# Patient Record
Sex: Female | Born: 1945 | Race: White | Hispanic: No | Marital: Married | Smoking: Never smoker
Health system: Southern US, Community
[De-identification: ages and names within clinical notes are randomized; demographics above are authoritative.]

## PROBLEM LIST (undated history)

## (undated) DIAGNOSIS — R011 Cardiac murmur, unspecified: Secondary | ICD-10-CM

## (undated) DIAGNOSIS — E7800 Pure hypercholesterolemia, unspecified: Secondary | ICD-10-CM

## (undated) DIAGNOSIS — T7840XA Allergy, unspecified, initial encounter: Secondary | ICD-10-CM

## (undated) DIAGNOSIS — N390 Urinary tract infection, site not specified: Secondary | ICD-10-CM

## (undated) DIAGNOSIS — K5140 Inflammatory polyps of colon without complications: Secondary | ICD-10-CM

## (undated) DIAGNOSIS — B019 Varicella without complication: Secondary | ICD-10-CM

## (undated) DIAGNOSIS — K5792 Diverticulitis of intestine, part unspecified, without perforation or abscess without bleeding: Secondary | ICD-10-CM

## (undated) DIAGNOSIS — I10 Essential (primary) hypertension: Secondary | ICD-10-CM

## (undated) DIAGNOSIS — M1990 Unspecified osteoarthritis, unspecified site: Secondary | ICD-10-CM

## (undated) DIAGNOSIS — G43909 Migraine, unspecified, not intractable, without status migrainosus: Secondary | ICD-10-CM

## (undated) HISTORY — DX: Cardiac murmur, unspecified: R01.1

## (undated) HISTORY — PX: OTHER SURGICAL HISTORY: SHX169

## (undated) HISTORY — DX: Urinary tract infection, site not specified: N39.0

## (undated) HISTORY — DX: Allergy, unspecified, initial encounter: T78.40XA

## (undated) HISTORY — DX: Diverticulitis of intestine, part unspecified, without perforation or abscess without bleeding: K57.92

## (undated) HISTORY — DX: Migraine, unspecified, not intractable, without status migrainosus: G43.909

## (undated) HISTORY — DX: Unspecified osteoarthritis, unspecified site: M19.90

## (undated) HISTORY — DX: Inflammatory polyps of colon without complications: K51.40

## (undated) HISTORY — DX: Varicella without complication: B01.9

## (undated) HISTORY — PX: PARTIAL COLECTOMY: SHX5273

## (undated) HISTORY — PX: COLON SURGERY: SHX602

---

## 1996-12-15 HISTORY — PX: APPENDECTOMY: SHX54

## 2011-03-14 ENCOUNTER — Ambulatory Visit: Payer: Self-pay | Admitting: Unknown Physician Specialty

## 2012-01-14 ENCOUNTER — Emergency Department: Payer: Self-pay | Admitting: Emergency Medicine

## 2012-01-14 LAB — URINALYSIS, COMPLETE
Bacteria: NONE SEEN
Bilirubin,UR: NEGATIVE
Blood: NEGATIVE
Glucose,UR: NEGATIVE mg/dL (ref 0–75)
Ketone: NEGATIVE
Leukocyte Esterase: NEGATIVE
Ph: 6 (ref 4.5–8.0)
Protein: NEGATIVE
RBC,UR: <1 /HPF (ref 0–5)
Squamous Epithelial: <1

## 2012-01-14 LAB — COMPREHENSIVE METABOLIC PANEL
Albumin: 4.1 g/dL (ref 3.4–5.0)
Alkaline Phosphatase: 38 U/L — ABNORMAL LOW (ref 50–136)
BUN: 17 mg/dL (ref 7–18)
Calcium, Total: 9.3 mg/dL (ref 8.5–10.1)
Co2: 27 mmol/L (ref 21–32)
EGFR (Non-African Amer.): >60
Glucose: 102 mg/dL — ABNORMAL HIGH (ref 65–99)
Osmolality: 285 (ref 275–301)
Potassium: 4.3 mmol/L (ref 3.5–5.1)
SGOT(AST): 27 U/L (ref 15–37)
SGPT (ALT): 29 U/L

## 2012-01-14 LAB — CBC
HCT: 42.3 % (ref 35.0–47.0)
MCH: 34 pg (ref 26.0–34.0)
MCHC: 34.3 g/dL (ref 32.0–36.0)
MCV: 99 fL (ref 80–100)
Platelet: 187 10*3/uL (ref 150–440)
RDW: 12.2 % (ref 11.5–14.5)
WBC: 10 10*3/uL (ref 3.6–11.0)

## 2012-09-20 ENCOUNTER — Encounter: Payer: Self-pay | Admitting: Internal Medicine

## 2012-09-20 ENCOUNTER — Ambulatory Visit (INDEPENDENT_AMBULATORY_CARE_PROVIDER_SITE_OTHER): Payer: BC Managed Care – PPO | Admitting: Internal Medicine

## 2012-09-20 VITALS — BP 120/70 | HR 81 | Temp 98.1°F | Ht 64.0 in | Wt 138.5 lb

## 2012-09-20 DIAGNOSIS — M7070 Other bursitis of hip, unspecified hip: Secondary | ICD-10-CM | POA: Insufficient documentation

## 2012-09-20 DIAGNOSIS — M76899 Other specified enthesopathies of unspecified lower limb, excluding foot: Secondary | ICD-10-CM

## 2012-09-20 NOTE — Assessment & Plan Note (Addendum)
And elbow, With prior normal films and symptoms occurring only with rest.  Continue NSAIDs, add tylenol.

## 2012-09-20 NOTE — Progress Notes (Signed)
Patient ID: Shannon Hickman, female   DOB: 1946-03-17, 66 y.o.   MRN: [id number]  Patient Active Problem List  Diagnosis  . Bursitis, hip    Subjective:  CC:   Chief Complaint  Patient presents with  . Establish Care    HPI:   Shannon Hickman a 66 y.o. female who presents with right sided hip pain.  Her pain occurs only at night,  And has been present for years but more frequently now. She has no prior history of trauma.  She has a new mattress . She sleeps on her back bc of hip pain . 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  She has treated her pain with alleve during the day, though.  Dr Toniann Fail rayed the hip a few years ago and it was normal.  2) Right elbow bursitis  , mild secondary to yard work     3)  Has been taking Climara patch for menopause and migraines which resolved.     Past Medical History  Diagnosis Date  . Chicken pox   . Diverticulitis   . Heart murmur   . Migraine   . Urinary tract infection   . Inflammatory polyps of colon     Past Surgical History  Procedure Date  . Histoplasmosis   . Appendectomy 1998  . Partial colectomy     diverticular rupture, Renda Rolls     The following portions of the patient's history were reviewed and updated as appropriate: Allergies, current medications, and problem list.    Review of Systems:   12 Pt  review of systems was negative except those addressed in the HPI,     History   Social History  . Marital Status: Married    Spouse Name: N/A    Number of Children: N/A  . Years of Education: N/A   Occupational History  . Not on file.   Social History Main Topics  . Smoking status: Never Smoker   . Smokeless tobacco: Not on file  . Alcohol Use: No  . Drug Use: Not on file  . Sexually Active: Not on file   Other Topics Concern  . Not on file   Social History Narrative  . No narrative on file    Objective:  BP 120/70  Pulse 81  Temp 98.1 F  (36.7 C) (Oral)  Ht 5\' 4"  (1.626 m)  Wt 138 lb 8 oz (62.823 kg)  BMI 23.77 kg/m2  SpO2 97%  General appearance: alert, cooperative and appears stated age Ears: normal TM's and external ear canals both ears Throat: lips, mucosa, and tongue normal; teeth and gums normal Neck: no adenopathy, no carotid bruit, supple, symmetrical, trachea midline and thyroid not enlarged, symmetric, no tenderness/mass/nodules Back: symmetric, no curvature. ROM normal. No CVA tenderness. Lungs: clear to auscultation bilaterally Heart: regular rate and rhythm, S1, S2 normal, no murmur, click, rub or gallop Abdomen: soft, non-tender; bowel sounds normal; no masses,  no organomegaly Pulses: 2+ and symmetric Skin: Skin color, texture, turgor normal. No rashes or lesions Lymph nodes: Cervical, supraclavicular, and axillary nodes normal.  Assessment and Plan:  Bursitis, hip With prior normal films and symptoms occurring only with rest.  Continue NSAIDs, add tylenol.    Updated Medication List Outpatient Encounter Prescriptions as of 09/20/2012  Medication Sig Dispense Refill  . aspirin-acetaminophen-caffeine (EXCEDRIN MIGRAINE) 250-250-65 MG per tablet Take 1 tablet by mouth every 6 (six) hours  as needed.      . calcium citrate (CALCITRATE - DOSED IN MG ELEMENTAL CALCIUM) 950 MG tablet Take 2 tablets by mouth daily.      . cetirizine (ZYRTEC) 10 MG tablet Take 10 mg by mouth as needed.      . cholecalciferol (VITAMIN D) 400 UNITS TABS Take by mouth.      [facility]) 0.045-0.015 MG/DAY Place 1 patch onto the skin once a week.      . fish oil-omega-3 fatty acids 1000 MG capsule Take 2 g by mouth daily.      . multivitamin-lutein (OCUVITE-LUTEIN) CAPS Take 1 capsule by mouth daily.      . naproxen sodium (ANAPROX) 220 MG tablet Take 220 mg by mouth as needed.      . vitamin E 400 UNIT capsule Take 400 Units by mouth daily.         Orders Placed This Encounter  Procedures  . HM  MAMMOGRAPHY  . HM PAP SMEAR  . HM COLONOSCOPY    No Follow-up on file.

## 2012-09-20 NOTE — Patient Instructions (Addendum)
Your hip and elbow are suffering from bursitis.    is ok to combine tylenol with alleve or motrin,  But do not combine motrin with alleve.  Never use more than 2000 mg tylenol in a 24 hour period.    Try to take a baby aspirin twice a week (primary prevention of stokes and heart attacks)  Practice your Kegel exercises (stopping your urine when you void) to help the stress incontinence .  You can try a joint supplement called glucosamine /chondroitin sulfate twice daily to see if it helps your joints.   Return in April for your  annual physical with PAP smear

## 2012-09-27 ENCOUNTER — Other Ambulatory Visit: Payer: Self-pay | Admitting: Internal Medicine

## 2013-03-16 ENCOUNTER — Other Ambulatory Visit (HOSPITAL_COMMUNITY)
Admission: RE | Admit: 2013-03-16 | Discharge: 2013-03-16 | Disposition: A | Discharge status: Home / Self Care | Payer: BC Managed Care – PPO | Source: Ambulatory Visit | Attending: Internal Medicine | Admitting: Internal Medicine

## 2013-03-16 ENCOUNTER — Encounter: Payer: Self-pay | Admitting: Internal Medicine

## 2013-03-16 ENCOUNTER — Ambulatory Visit (INDEPENDENT_AMBULATORY_CARE_PROVIDER_SITE_OTHER): Payer: BC Managed Care – PPO | Admitting: Internal Medicine

## 2013-03-16 VITALS — BP 128/66 | HR 80 | Temp 97.9°F | Resp 18 | Ht 63.0 in | Wt 145.0 lb

## 2013-03-16 DIAGNOSIS — Z0000 Encounter for general adult medical examination without abnormal findings: Secondary | ICD-10-CM

## 2013-03-16 DIAGNOSIS — Z01419 Encounter for gynecological examination (general) (routine) without abnormal findings: Secondary | ICD-10-CM | POA: Insufficient documentation

## 2013-03-16 DIAGNOSIS — Z1239 Encounter for other screening for malignant neoplasm of breast: Secondary | ICD-10-CM

## 2013-03-16 DIAGNOSIS — Z1151 Encounter for screening for human papillomavirus (HPV): Secondary | ICD-10-CM | POA: Insufficient documentation

## 2013-03-16 DIAGNOSIS — N950 Postmenopausal bleeding: Secondary | ICD-10-CM

## 2013-03-16 DIAGNOSIS — Z23 Encounter for immunization: Secondary | ICD-10-CM

## 2013-03-16 NOTE — Progress Notes (Signed)
Patient ID: Shannon Hickman, female   DOB: 07/23/1946, 67 y.o.   MRN: [id number]      Subjective:     Shannon Hickman is a 67 y.o. female and is here for a comprehensive physical exam. The patient reports frecent development of post menopausal bleeding .  She has been using ClimaraPro  for years , started taking it prior to menopause for management of hot flushes and reports that she has had periodic spotting in the last year.     ..  History   Social History  . Marital Status: Married    Spouse Name: N/A    Number of Children: N/A  . Years of Education: N/A   Occupational History  . Not on file.   Social History Main Topics  . Smoking status: Never Smoker   . Smokeless tobacco: Not on file  . Alcohol Use: No  . Drug Use: Not on file  . Sexually Active: Not on file   Other Topics Concern  . Not on file   Social History Narrative  . No narrative on file   Health Maintenance  Topic Date Due  . Influenza Vaccine  08/15/2013  . Mammogram  07/29/2014  . Tetanus/tdap  01/16/2021  . Colonoscopy  06/20/2022  . Pneumococcal Polysaccharide Vaccine Age 33 And Over  Completed  . Zostavax  Completed    The following portions of the patient's history were reviewed and updated as appropriate: allergies, current medications, past family history, past medical history, past social history, past surgical history and problem list.  Review of Systems A comprehensive review of systems was negative.   Objective:    BP 128/66  Pulse 80  Temp(Src) 97.9 F (36.6 C) (Oral)  Resp 18  Ht 5\' 3"  (1.6 m)  Wt 145 lb (65.772 kg)  BMI 25.69 kg/m2  SpO2 97%  General Appearance:    Alert, cooperative, no distress, appears stated age  Head:    Normocephalic, without obvious abnormality, atraumatic  Eyes:    PERRL, conjunctiva/corneas clear, EOM's intact, fundi    benign, both eyes  Ears:    Normal TM's and external ear canals, both ears  Nose:   Nares normal, septum midline, mucosa normal, no  drainage    or sinus tenderness  Throat:   Lips, mucosa, and tongue normal; teeth and gums normal  Neck:   Supple, symmetrical, trachea midline, no adenopathy;    thyroid:  no enlargement/tenderness/nodules; no carotid   bruit or JVD  Back:     Symmetric, no curvature, ROM normal, no CVA tenderness  Lungs:     Clear to auscultation bilaterally, respirations unlabored  Chest Wall:    No tenderness or deformity   Heart:    Regular rate and rhythm, S1 and S2 normal, no murmur, rub   or gallop  Breast Exam:    No tenderness, masses, or nipple abnormality  Abdomen:     Soft, non-tender, bowel sounds active all four quadrants,    no masses, no organomegaly  Genitalia:    Pelvic: cervix normal in appearance, external genitalia normal, no adnexal masses or tenderness, no cervical motion tenderness, rectovaginal septum normal, uterus normal size, shape, and consistency. Fresh blood in vaginal vault  Extremities:   Extremities normal, atraumatic, no cyanosis or edema  Pulses:   2+ and symmetric all extremities  Skin:   Skin color, texture, turgor normal, no rashes or lesions  Lymph nodes:   Cervical, supraclavicular, and axillary nodes normal  Neurologic:  CNII-XII intact, normal strength, sensation and reflexes    throughout    Assessment and Plan  Post-menopausal bleeding She has developed bleeding after many years of amenorrhea, while estrogen-progesterone therapy.  PAP smear was done today.  ClimaraPro has been suspended and she will be referred to Dr. Greggory Keen for evaluation of endometrium.   Routine general medical examination at a health care facility Annual exam including breast , pelvic and PAP smear were done today.    Updated Medication List Outpatient Encounter Prescriptions as of 03/16/2013  Medication Sig Dispense Refill  . aspirin-acetaminophen-caffeine (EXCEDRIN MIGRAINE) 250-250-65 MG per tablet Take 1 tablet by mouth every 6 (six) hours as needed.      . calcium citrate  (CALCITRATE - DOSED IN MG ELEMENTAL CALCIUM) 950 MG tablet Take 2 tablets by mouth daily.      . cetirizine (ZYRTEC) 10 MG tablet Take 10 mg by mouth as needed.      . cholecalciferol (VITAMIN D) 400 UNITS TABS Take by mouth.      . fish oil-omega-3 fatty acids 1000 MG capsule Take 2 g by mouth daily.      . multivitamin-lutein (OCUVITE-LUTEIN) CAPS Take 1 capsule by mouth daily.      . naproxen sodium (ANAPROX) 220 MG tablet Take 220 mg by mouth as needed.      . vitamin E 400 UNIT capsule Take 400 Units by mouth daily.      . [DISCONTINUED] estradiol-levonorgestrel (CLIMARAPRO) 0.045-0.015 MG/DAY Place 1 patch onto the skin once a week.       No facility-administered encounter medications on file as of 03/16/2013.

## 2013-03-16 NOTE — Patient Instructions (Addendum)
I recommend you have a Pneumonia vaccine    Mammogram ordered   Please stop the Climara.  If the vaginal bleeding does not stop you will need an endometrial biopsy to rule out endometrial CA as the cause

## 2013-03-17 ENCOUNTER — Encounter: Payer: Self-pay | Admitting: Internal Medicine

## 2013-03-17 ENCOUNTER — Telehealth: Payer: Self-pay | Admitting: Internal Medicine

## 2013-03-17 DIAGNOSIS — N950 Postmenopausal bleeding: Secondary | ICD-10-CM | POA: Insufficient documentation

## 2013-03-17 NOTE — Telephone Encounter (Signed)
I have been reviewing Shannon Hickman's history and  I'm not comfortable with our plan on waiting to see if her vaginal bleeding stops when she discontinues the climaraPro patch. I think she needs ot see a gynecologist to have an endometrial evaluation.  They may just do an ultrasound, not a biopsy . depending on the ultrasound.  Does she have a preference who I refer her to ?

## 2013-03-17 NOTE — Assessment & Plan Note (Addendum)
She has developed bleeding after many years of amenorrhea, while estrogen-progesterone therapy.  PAP smear was done today.  ClimaraPro has been suspended and she will be referred to Dr. Greggory Keen for evaluation of endometrium.

## 2013-03-17 NOTE — Assessment & Plan Note (Signed)
Annual exam including breast , pelvic and PAP smear were  done today.  

## 2013-03-18 NOTE — Telephone Encounter (Signed)
Referral is in process as requested 

## 2013-03-18 NOTE — Telephone Encounter (Signed)
Pt has no preference on a gynecologist.

## 2013-03-22 ENCOUNTER — Encounter: Payer: Self-pay | Admitting: General Practice

## 2013-03-23 ENCOUNTER — Other Ambulatory Visit (INDEPENDENT_AMBULATORY_CARE_PROVIDER_SITE_OTHER): Payer: BC Managed Care – PPO

## 2013-03-23 DIAGNOSIS — Z0000 Encounter for general adult medical examination without abnormal findings: Secondary | ICD-10-CM

## 2013-03-23 LAB — COMPREHENSIVE METABOLIC PANEL
ALT: 23 U/L (ref 0–35)
CO2: 28 mEq/L (ref 19–32)
Calcium: 8.9 mg/dL (ref 8.4–10.5)
Chloride: 105 mEq/L (ref 96–112)
GFR: 79.82 mL/min (ref >60.00)
Glucose, Bld: 92 mg/dL (ref 70–99)
Sodium: 139 mEq/L (ref 135–145)
Total Bilirubin: 1 mg/dL (ref 0.3–1.2)
Total Protein: 7.2 g/dL (ref 6.0–8.3)

## 2013-03-23 LAB — LIPID PANEL: HDL: 60.6 mg/dL (ref >39.00)

## 2013-03-23 LAB — CBC WITH DIFFERENTIAL/PLATELET
Basophils Absolute: 0 10*3/uL (ref 0.0–0.1)
Eosinophils Relative: 1.4 % (ref 0.0–5.0)
HCT: 43.8 % (ref 36.0–46.0)
Hemoglobin: 14.7 g/dL (ref 12.0–15.0)
Lymphocytes Relative: 20.4 % (ref 12.0–46.0)
Lymphs Abs: 0.9 10*3/uL (ref 0.7–4.0)
Monocytes Relative: 12.9 % — ABNORMAL HIGH (ref 3.0–12.0)
Neutro Abs: 3 10*3/uL (ref 1.4–7.7)
Platelets: 170 10*3/uL (ref 150.0–400.0)
WBC: 4.6 10*3/uL (ref 4.5–10.5)

## 2013-03-23 LAB — TSH: TSH: 0.9 u[IU]/mL (ref 0.35–5.50)

## 2013-03-24 ENCOUNTER — Encounter: Payer: Self-pay | Admitting: General Practice

## 2013-06-27 DIAGNOSIS — N760 Acute vaginitis: Secondary | ICD-10-CM | POA: Diagnosis not present

## 2013-06-27 DIAGNOSIS — L940 Localized scleroderma [morphea]: Secondary | ICD-10-CM | POA: Diagnosis not present

## 2013-07-06 ENCOUNTER — Ambulatory Visit (INDEPENDENT_AMBULATORY_CARE_PROVIDER_SITE_OTHER): Payer: Medicare Other | Admitting: Internal Medicine

## 2013-07-06 ENCOUNTER — Encounter: Payer: Self-pay | Admitting: Internal Medicine

## 2013-07-06 VITALS — BP 148/88 | HR 95 | Temp 98.4°F | Resp 16 | Wt 141.0 lb

## 2013-07-06 DIAGNOSIS — N950 Postmenopausal bleeding: Secondary | ICD-10-CM | POA: Diagnosis not present

## 2013-07-06 DIAGNOSIS — I10 Essential (primary) hypertension: Secondary | ICD-10-CM

## 2013-07-06 DIAGNOSIS — R030 Elevated blood-pressure reading, without diagnosis of hypertension: Secondary | ICD-10-CM

## 2013-07-06 LAB — MICROALBUMIN / CREATININE URINE RATIO: Microalb Creat Ratio: 0.8 mg/g (ref 0.0–30.0)

## 2013-07-06 NOTE — Patient Instructions (Addendum)
Your blood pressure is elevated.  (Normal is 130/80 or less)   I recommend giving up caffeine for a week to see if it resolves your hypertension  If your home readings are <  140/80,  Bring your machine by to comfirm its accuracy and we will not need to start medication unless there is protein in your urine  If readings are > 140/80 after giving up caffeine,  We will start medication

## 2013-07-06 NOTE — Progress Notes (Signed)
Patient ID: Emiko Osorto, female   DOB: 04-10-1946, 67 y.o.   MRN: [id number]  Patient Active Problem List   Diagnosis Date Noted  . Blood pressure elevated without history of HTN 07/07/2013  . Post-menopausal bleeding 03/17/2013  . Routine general medical examination at a health care facility 03/16/2013  . Bursitis, hip 09/20/2012    Subjective:  CC:   Chief Complaint  Patient presents with  . Follow-up    BP issues    HPI:   Graceyn Fodor a 67 y.o. female who presents for followup on multiple issues including new onset hypertension and postmenopausal bleeding. She stopped the climara patch in April.  She has been seeing Dr. Greggory Keen for endometrial biopsies and was noted on each occasion to have elevated blood pressure. She has considerable anxiety when she goes to his office due to the painful endometrial biopsies if he is performing. She has no history of hypertension prior to this. She exercises regularly 4-5 times a week. She is not overweight. She has a history of snoring when she is really tired. Her husband has not noted any apneic breathing episodes. She does note some fatigue when she is sitting doing passive activities. She has never fallen asleep while driving the car or at a stop light. She does have considerable caffeine intake including [employer] coffee daily and Excedrin headache 3-4 times a week.    Past Medical History  Diagnosis Date  . Chicken pox   . Diverticulitis   . Heart murmur   . Migraine   . Urinary tract infection   . Inflammatory polyps of colon     Past Surgical History  Procedure Laterality Date  . Histoplasmosis    . Appendectomy  1998  . Partial colectomy      diverticular rupture, Renda Rolls        The following portions of the patient's history were reviewed and updated as appropriate: Allergies, current medications, and problem list.    Review of Systems:   Patient denies headache, fevers, malaise, unintentional weight  loss, skin rash, eye pain, sinus congestion and sinus pain, sore throat, dysphagia,  hemoptysis , cough, dyspnea, wheezing, chest pain, palpitations, orthopnea, edema, abdominal pain, nausea, melena, diarrhea, constipation, flank pain, dysuria, hematuria, urinary  Frequency, nocturia, numbness, tingling, seizures,  Focal weakness, Loss of consciousness,  Tremor, insomnia, depression, anxiety, and suicidal ideation.     History   Social History  . Marital Status: Married    Spouse Name: N/A    Number of Children: N/A  . Years of Education: N/A   Occupational History  . Not on file.   Social History Main Topics  . Smoking status: Never Smoker   . Smokeless tobacco: Not on file  . Alcohol Use: No  . Drug Use: Not on file  . Sexually Active: Not on file   Other Topics Concern  . Not on file   Social History Narrative  . No narrative on file    Objective:  BP 148/88  Pulse 95  Temp(Src) 98.4 F (36.9 C) (Oral)  Resp 16  Wt 141 lb (63.957 kg)  BMI 24.98 kg/m2  SpO2 98%  General appearance: alert, cooperative and appears stated age Ears: normal TM's and external ear canals both ears Throat: lips, mucosa, and tongue normal; teeth and gums normal Neck: no adenopathy, no carotid bruit, supple, symmetrical, trachea midline and thyroid not enlarged, symmetric, no tenderness/mass/nodules Back: symmetric, no curvature. ROM normal. No CVA tenderness. Lungs: clear to auscultation bilaterally  Heart: regular rate and rhythm, S1, S2 normal, no murmur, click, rub or gallop Abdomen: soft, non-tender; bowel sounds normal; no masses,  no organomegaly Pulses: 2+ and symmetric Skin: Skin color, texture, turgor normal. No rashes or lesions Lymph nodes: Cervical, supraclavicular, and axillary nodes normal.  Assessment and Plan:  Blood pressure elevated without history of HTN Elevated blood pressures have been noted only since she developed endometrial bleeding and need for recurrent  biopsies .  She is not taking a nonsteroidal anti-inflammatory on a daily basis. she has normal renal function and no evidence of micro-proteinuria.. We discussed reducing her caffeine intake and rechecking her blood pressures. I have not started her on any medication at this time.  Post-menopausal bleeding Roughly due to use of Climara patch. This is been discontinued. She is following up with Dr. Alvie Heidelberg for endometrial biopsies.   Updated Medication List Outpatient Encounter Prescriptions as of 07/06/2013  Medication Sig Dispense Refill  . aspirin-acetaminophen-caffeine (EXCEDRIN MIGRAINE) 250-250-65 MG per tablet Take 1 tablet by mouth every 6 (six) hours as needed.      . calcium citrate (CALCITRATE - DOSED IN MG ELEMENTAL CALCIUM) 950 MG tablet Take 2 tablets by mouth daily.      . cetirizine (ZYRTEC) 10 MG tablet Take 10 mg by mouth as needed.      . cholecalciferol (VITAMIN D) 400 UNITS TABS Take by mouth.      . fish oil-omega-3 fatty acids 1000 MG capsule Take 2 g by mouth daily.      . multivitamin-lutein (OCUVITE-LUTEIN) CAPS Take 1 capsule by mouth daily.      . naproxen sodium (ANAPROX) 220 MG tablet Take 220 mg by mouth as needed.      . vitamin E 400 UNIT capsule Take 400 Units by mouth daily.       No facility-administered encounter medications on file as of 07/06/2013.     Orders Placed This Encounter  Procedures  . Microalbumin / creatinine urine ratio    No Follow-up on file.

## 2013-07-07 ENCOUNTER — Encounter: Payer: Self-pay | Admitting: Internal Medicine

## 2013-07-07 DIAGNOSIS — I10 Essential (primary) hypertension: Secondary | ICD-10-CM | POA: Insufficient documentation

## 2013-07-07 NOTE — Assessment & Plan Note (Addendum)
Elevated blood pressures have been noted only since she developed endometrial bleeding and need for recurrent biopsies .  She is not taking a nonsteroidal anti-inflammatory on a daily basis. she has normal renal function and no evidence of micro-proteinuria.. We discussed reducing her caffeine intake and rechecking her blood pressures. I have not started her on any medication at this time.

## 2013-07-07 NOTE — Assessment & Plan Note (Signed)
Roughly due to use of Climara patch. This is been discontinued. She is following up with Dr. Alvie Heidelberg for endometrial biopsies.

## 2013-07-25 DIAGNOSIS — Z1231 Encounter for screening mammogram for malignant neoplasm of breast: Secondary | ICD-10-CM | POA: Diagnosis not present

## 2013-08-02 ENCOUNTER — Encounter: Payer: Self-pay | Admitting: Internal Medicine

## 2013-08-25 LAB — HM PAP SMEAR: HM PAP: NORMAL

## 2013-09-05 DIAGNOSIS — L940 Localized scleroderma [morphea]: Secondary | ICD-10-CM | POA: Diagnosis not present

## 2013-09-05 DIAGNOSIS — N951 Menopausal and female climacteric states: Secondary | ICD-10-CM | POA: Diagnosis not present

## 2014-07-27 DIAGNOSIS — Z1231 Encounter for screening mammogram for malignant neoplasm of breast: Secondary | ICD-10-CM | POA: Diagnosis not present

## 2014-07-27 DIAGNOSIS — R928 Other abnormal and inconclusive findings on diagnostic imaging of breast: Secondary | ICD-10-CM | POA: Diagnosis not present

## 2014-07-27 DIAGNOSIS — Z978 Presence of other specified devices: Secondary | ICD-10-CM | POA: Diagnosis not present

## 2014-08-02 ENCOUNTER — Encounter: Payer: Self-pay | Admitting: Internal Medicine

## 2014-08-25 ENCOUNTER — Ambulatory Visit (INDEPENDENT_AMBULATORY_CARE_PROVIDER_SITE_OTHER): Payer: Medicare Other | Admitting: Internal Medicine

## 2014-08-25 ENCOUNTER — Encounter: Payer: Self-pay | Admitting: Internal Medicine

## 2014-08-25 VITALS — BP 146/74 | HR 77 | Temp 98.0°F | Resp 14 | Ht 63.0 in | Wt 148.5 lb

## 2014-08-25 DIAGNOSIS — Z0000 Encounter for general adult medical examination without abnormal findings: Secondary | ICD-10-CM

## 2014-08-25 DIAGNOSIS — N950 Postmenopausal bleeding: Secondary | ICD-10-CM

## 2014-08-25 DIAGNOSIS — B351 Tinea unguium: Secondary | ICD-10-CM

## 2014-08-25 DIAGNOSIS — N763 Subacute and chronic vulvitis: Secondary | ICD-10-CM

## 2014-08-25 DIAGNOSIS — R5381 Other malaise: Secondary | ICD-10-CM | POA: Diagnosis not present

## 2014-08-25 DIAGNOSIS — R030 Elevated blood-pressure reading, without diagnosis of hypertension: Secondary | ICD-10-CM | POA: Diagnosis not present

## 2014-08-25 DIAGNOSIS — Z23 Encounter for immunization: Secondary | ICD-10-CM | POA: Diagnosis not present

## 2014-08-25 DIAGNOSIS — R5383 Other fatigue: Secondary | ICD-10-CM

## 2014-08-25 DIAGNOSIS — Z13820 Encounter for screening for osteoporosis: Secondary | ICD-10-CM

## 2014-08-25 DIAGNOSIS — Z13220 Encounter for screening for lipoid disorders: Secondary | ICD-10-CM

## 2014-08-25 NOTE — Progress Notes (Signed)
Patient ID: Shannon Hickman, female   DOB: [birth year], 68 y.o.   MRN: [id number]  The patient is here for annual Medicare wellness examination and management of other chronic and acute problems.  Seh has several subacute issues she raised today     1) Skin tags.  Occurring on the chest wall and bra line.   2) She notes recent development of numbness involving the first 3 fingers on the right hand when she dried her hair or uses her hand.  The numbness resolves, if she drops her wrist.  She has developed a trigger finger  On the right hand,  4th finger .  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   3) Toenail fungus .involving both feet.  re reflected in the social history.  The roster of all physicians providing medical care to patient - is listed in the Snapshot section of the chart.  Activities of daily living:  The patient is 100% independent in all ADLs: dressing, toileting, feeding as well as independent mobility  Home safety : The patient has smoke detectors in the home. They wear seatbelts.  There are no firearms at home. There is no violence in the home.   There is no risks for hepatitis, STDs or HIV. There is no   history of blood transfusion. They have no travel history to infectious disease endemic areas of the world.  The patient has seen their dentist in the last six month. They have seen their eye doctor in the last year. They admit to slight hearing difficulty with regard to whispered voices and some television programs.  They have deferred audiologic testing in the last year.  They do not  have excessive sun exposure. Discussed the need for sun protection: hats, long sleeves and use of sunscreen if there is significant sun exposure.   Diet: the importance of a healthy diet is discussed. They do have a healthy diet.  The benefits of regular aerobic exercise were discussed. She  walks 4 times per week ,  20 minutes.   Depression screen: there are no signs or vegative symptoms of depression- irritability, change in appetite, anhedonia, sadness/tearfullness.  Cognitive assessment: the patient manages all their financial and personal affairs and is actively engaged. They could relate day,date,year and events; recalled 2/3 objects at 3 minutes; performed clock-face test normally.  The following portions of the patient's history were reviewed and updated as appropriate: allergies, current medications, past family history, past medical history,  past surgical history, past social history  and problem list.  Visual acuity was not assessed per patient preference since she has regular follow up with her ophthalmologist. Hearing and body mass index were assessed and reviewed.   During the course of the visit the patient was educated and counseled about appropriate screening and preventive services including : fall prevention , diabetes screening, nutrition counseling, colorectal cancer screening, and recommended immunizations.    Objective:  BP 146/74  Pulse 77  Temp(Src) 98 F (36.7 C) (Oral)  Resp 14  Ht 5\' 3"  (1.6 m)  Wt 148 lb 8 oz (67.359 kg)  BMI 26.31 kg/m2  SpO2 98%  General appearance: alert, cooperative and appears stated age Head: Normocephalic, without obvious abnormality, atraumatic Eyes: conjunctivae/corneas clear. PERRL, EOM's intact. Fundi benign. Ears: normal TM's and external ear canals both ears Nose: Nares normal. Septum midline. Mucosa normal. No drainage or sinus tenderness. Throat:  lips, mucosa, and tongue normal; teeth and gums normal Neck: no adenopathy, no carotid bruit, no JVD, supple, symmetrical, trachea midline and thyroid not enlarged, symmetric, no tenderness/mass/nodules Lungs: clear to auscultation bilaterally Breasts: normal appearance, no masses or tenderness Heart: regular rate and rhythm, S1, S2 normal, no murmur, click, rub or  gallop Abdomen: soft, non-tender; bowel sounds normal; no masses,  no organomegaly Extremities: extremities normal, atraumatic, no cyanosis or edema Pulses: 2+ and symmetric Skin: Skin color, texture, turgor normal. No rashes or lesions Neurologic: Alert and oriented X 3, normal strength and tone. Normal symmetric reflexes. Normal coordination and gait.   Assessment and Plan:  Onychomycosis Bilateral , has tried OTC meds without success . Will return for baseline lfts and start oral terbinafine 250 mg daily x 3 months.   Encounter for Medicare annual wellness exam Annual Medicare wellness  exam was done as well as a comprehensive physical exam and management of acute and chronic conditions .  During the course of the visit the patient was educated and counseled about appropriate screening and preventive services including : fall prevention , diabetes screening, nutrition counseling, colorectal cancer screening, and recommended immunizations.  Printed recommendations for health maintenance screenings was given.   Chronic vulvitis Managed by Dr Enzo Bi,  Biopsy done July 2014 prior to initiation of steroid therapy  Blood pressure elevated without history of HTN Home BPs have not been checked,  And the last 3 office notes (mine nad Dr Enzo Bi) note Stage 1 hypertension.  Will start hctz for management. She will return for baseline assesment of renal function and liver function   Lab Results  Component Value Date   CREATININE 0.8 03/23/2013      Updated Medication List Outpatient Encounter Prescriptions as of 08/25/2014  Medication Sig  . acetaminophen (TYLENOL) 325 MG tablet Take 650 mg by mouth every 6 (six) hours as needed.  . calcium citrate (CALCITRATE - DOSED IN MG ELEMENTAL CALCIUM) 950 MG tablet Take 2 tablets by mouth daily.  . cetirizine (ZYRTEC) 10 MG tablet Take 10 mg by mouth as needed.  . COCONUT OIL PO Take 1,000 mg by mouth 2 (two) times daily.  . fish oil-omega-3  fatty acids 1000 MG capsule Take 2 g by mouth daily.  . Multiple Vitamins-Minerals (HAIR/SKIN/NAILS/BIOTIN PO) Take 1 tablet by mouth daily.  . multivitamin-lutein (OCUVITE-LUTEIN) CAPS Take 1 capsule by mouth daily.  . naproxen sodium (ANAPROX) 220 MG tablet Take 220 mg by mouth 2 (two) times daily with a meal.   . [DISCONTINUED] aspirin-acetaminophen-caffeine (EXCEDRIN MIGRAINE) 250-250-65 MG per tablet Take 1 tablet by mouth every 6 (six) hours as needed.  . [DISCONTINUED] cholecalciferol (VITAMIN D) 400 UNITS TABS Take by mouth.  . [DISCONTINUED] vitamin E 400 UNIT capsule Take 400 Units by mouth daily.

## 2014-08-25 NOTE — Assessment & Plan Note (Addendum)
Bilateral , has tried OTC meds without success . Will return for baseline lfts and start oral terbinafine 250 mg daily x 3 months.

## 2014-08-25 NOTE — Progress Notes (Signed)
Pre visit review using our clinic review tool, if applicable. No additional management support is needed unless otherwise documented below in the visit note. 

## 2014-08-25 NOTE — Patient Instructions (Addendum)
Next we should obtain a 3 d mammogram since your breasts are "dense."    Pumpkin Raisin Crunch and All Bran combination gives you 22 g fiber with the last amount of added sugar   Atkins bars and Quest bars are loaded with protein and very little sugar   I recommend getting the majority of your calcium and Vitamin D  through diet rather than supplements given the recent association of calcium supplements with increased coronary artery calcium scores (You need 1200 mg daily )   Unsweetened almond/coconut milk is a great low calorie low carb, cholesterol free  way to increase your dietary calcium and vitamin D.  Try the blue Jackquline Bosch  Return for fasting labs  Once  I review liver tests,  I'll send in a rx for toenail fungus medication   Bone Density test to be ordered  Health Maintenance Adopting a healthy lifestyle and getting preventive care can go a long way to promote health and wellness. Talk with your health care provider about what schedule of regular examinations is right for you. This is a good chance for you to check in with your provider about disease prevention and staying healthy. In between checkups, there are plenty of things you can do on your own. Experts have done a lot of research about which lifestyle changes and preventive measures are most likely to keep you healthy. Ask your health care provider for more information. WEIGHT AND DIET  Eat a healthy diet  Be sure to include plenty of vegetables, fruits, low-fat dairy products, and lean protein.  Do not eat a lot of foods high in solid fats, added sugars, or salt.  Get regular exercise. This is one of the most important things you can do for your health.  Most adults should exercise for at least 150 minutes each week. The exercise should increase your heart rate and make you sweat (moderate-intensity exercise).  Most adults should also do strengthening exercises at least twice a week. This is in addition to the  moderate-intensity exercise.  Maintain a healthy weight  Body mass index (BMI) is a measurement that can be used to identify possible weight problems. It estimates body fat based on height and weight. Your health care provider can help determine your BMI and help you achieve or maintain a healthy weight.  For females 81 years of age and older:   A BMI below 18.5 is considered underweight.  A BMI of 18.5 to 24.9 is normal.  A BMI of 25 to 29.9 is considered overweight.  A BMI of 30 and above is considered obese.  Watch levels of cholesterol and blood lipids  You should start having your blood tested for lipids and cholesterol at 67 years of age, then have this test every 5 years.  You may need to have your cholesterol levels checked more often if:  Your lipid or cholesterol levels are high.  You are older than 67 years of age.  You are at high risk for heart disease.  CANCER SCREENING   Lung Cancer  Lung cancer screening is recommended for adults 25-61 years old who are at high risk for lung cancer because of a history of smoking.  A yearly low-dose CT scan of the lungs is recommended for people who:  Currently smoke.  Have quit within the past 15 years.  Have at least a 30-pack-year history of smoking. A pack year is smoking an average of one pack of cigarettes a day for  1 year.  Yearly screening should continue until it has been 15 years since you quit.  Yearly screening should stop if you develop a health problem that would prevent you from having lung cancer treatment.  Breast Cancer  Practice breast self-awareness. This means understanding how your breasts normally appear and feel.  It also means doing regular breast self-exams. Let your health care provider know about any changes, no matter how small.  If you are in your 20s or 30s, you should have a clinical breast exam (CBE) by a health care provider every 1-3 years as part of a regular health exam.  If  you are 76 or older, have a CBE every year. Also consider having a breast X-ray (mammogram) every year.  If you have a family history of breast cancer, talk to your health care provider about genetic screening.  If you are at high risk for breast cancer, talk to your health care provider about having an MRI and a mammogram every year.  Breast cancer gene (BRCA) assessment is recommended for women who have family members with BRCA-related cancers. BRCA-related cancers include:  Breast.  Ovarian.  Tubal.  Peritoneal cancers.  Results of the assessment will determine the need for genetic counseling and BRCA1 and BRCA2 testing. Cervical Cancer Routine pelvic examinations to screen for cervical cancer are no longer recommended for nonpregnant women who are considered low risk for cancer of the pelvic organs (ovaries, uterus, and vagina) and who do not have symptoms. A pelvic examination may be necessary if you have symptoms including those associated with pelvic infections. Ask your health care provider if a screening pelvic exam is right for you.   The Pap test is the screening test for cervical cancer for women who are considered at risk.  If you had a hysterectomy for a problem that was not cancer or a condition that could lead to cancer, then you no longer need Pap tests.  If you are older than 65 years, and you have had normal Pap tests for the past 10 years, you no longer need to have Pap tests.  If you have had past treatment for cervical cancer or a condition that could lead to cancer, you need Pap tests and screening for cancer for at least 20 years after your treatment.  If you no longer get a Pap test, assess your risk factors if they change (such as having a new sexual partner). This can affect whether you should start being screened again.  Some women have medical problems that increase their chance of getting cervical cancer. If this is the case for you, your health care  provider may recommend more frequent screening and Pap tests.  The human papillomavirus (HPV) test is another test that may be used for cervical cancer screening. The HPV test looks for the virus that can cause cell changes in the cervix. The cells collected during the Pap test can be tested for HPV.  The HPV test can be used to screen women 56 years of age and older. Getting tested for HPV can extend the interval between normal Pap tests from three to five years.  An HPV test also should be used to screen women of any age who have unclear Pap test results.  After 67 years of age, women should have HPV testing as often as Pap tests.  Colorectal Cancer  This type of cancer can be detected and often prevented.  Routine colorectal cancer screening usually begins at 67 years of  age and continues through 67 years of age.  Your health care provider may recommend screening at an earlier age if you have risk factors for colon cancer.  Your health care provider may also recommend using home test kits to check for hidden blood in the stool.  A small camera at the end of a tube can be used to examine your colon directly (sigmoidoscopy or colonoscopy). This is done to check for the earliest forms of colorectal cancer.  Routine screening usually begins at age 18.  Direct examination of the colon should be repeated every 5-10 years through 67 years of age. However, you may need to be screened more often if early forms of precancerous polyps or small growths are found. Skin Cancer  Check your skin from head to toe regularly.  Tell your health care provider about any new moles or changes in moles, especially if there is a change in a mole's shape or color.  Also tell your health care provider if you have a mole that is larger than the size of a pencil eraser.  Always use sunscreen. Apply sunscreen liberally and repeatedly throughout the day.  Protect yourself by wearing long sleeves, pants, a  wide-brimmed hat, and sunglasses whenever you are outside. HEART DISEASE, DIABETES, AND HIGH BLOOD PRESSURE   Have your blood pressure checked at least every 1-2 years. High blood pressure causes heart disease and increases the risk of stroke.  If you are between 11 years and 54 years old, ask your health care provider if you should take aspirin to prevent strokes.  Have regular diabetes screenings. This involves taking a blood sample to check your fasting blood sugar level.  If you are at a normal weight and have a low risk for diabetes, have this test once every three years after 67 years of age.  If you are overweight and have a high risk for diabetes, consider being tested at a younger age or more often. PREVENTING INFECTION  Hepatitis B  If you have a higher risk for hepatitis B, you should be screened for this virus. You are considered at high risk for hepatitis B if:  You were born in a country where hepatitis B is common. Ask your health care provider which countries are considered high risk.  Your parents were born in a high-risk country, and you have not been immunized against hepatitis B (hepatitis B vaccine).  You have HIV or AIDS.  You use needles to inject street drugs.  You live with someone who has hepatitis B.  You have had sex with someone who has hepatitis B.  You get hemodialysis treatment.  You take certain medicines for conditions, including cancer, organ transplantation, and autoimmune conditions. Hepatitis C  Blood testing is recommended for:  Everyone born from [birth year] through 1964.  Anyone with known risk factors for hepatitis C. Sexually transmitted infections (STIs)  You should be screened for sexually transmitted infections (STIs) including gonorrhea and chlamydia if:  You are sexually active and are younger than 67 years of age.  You are older than 67 years of age and your health care provider tells you that you are at risk for this type of  infection.  Your sexual activity has changed since you were last screened and you are at an increased risk for chlamydia or gonorrhea. Ask your health care provider if you are at risk.  If you do not have HIV, but are at risk, it may be recommended that you take a  prescription medicine daily to prevent HIV infection. This is called pre-exposure prophylaxis (PrEP). You are considered at risk if:  You are sexually active and do not regularly use condoms or know the HIV status of your partner(s).  You take drugs by injection.  You are sexually active with a partner who has HIV. Talk with your health care provider about whether you are at high risk of being infected with HIV. If you choose to begin PrEP, you should first be tested for HIV. You should then be tested every 3 months for as long as you are taking PrEP.  PREGNANCY   If you are premenopausal and you may become pregnant, ask your health care provider about preconception counseling.  If you may become pregnant, take 400 to 800 micrograms (mcg) of folic acid every day.  If you want to prevent pregnancy, talk to your health care provider about birth control (contraception). OSTEOPOROSIS AND MENOPAUSE   Osteoporosis is a disease in which the bones lose minerals and strength with aging. This can result in serious bone fractures. Your risk for osteoporosis can be identified using a bone density scan.  If you are 46 years of age or older, or if you are at risk for osteoporosis and fractures, ask your health care provider if you should be screened.  Ask your health care provider whether you should take a calcium or vitamin D supplement to lower your risk for osteoporosis.  Menopause may have certain physical symptoms and risks.  Hormone replacement therapy may reduce some of these symptoms and risks. Talk to your health care provider about whether hormone replacement therapy is right for you.  HOME CARE INSTRUCTIONS   Schedule regular  health, dental, and eye exams.  Stay current with your immunizations.   Do not use any tobacco products including cigarettes, chewing tobacco, or electronic cigarettes.  If you are pregnant, do not drink alcohol.  If you are breastfeeding, limit how much and how often you drink alcohol.  Limit alcohol intake to no more than 1 drink per day for nonpregnant women. One drink equals 12 ounces of beer, 5 ounces of wine, or 1 ounces of hard liquor.  Do not use street drugs.  Do not share needles.  Ask your health care provider for help if you need support or information about quitting drugs.  Tell your health care provider if you often feel depressed.  Tell your health care provider if you have ever been abused or do not feel safe at home. Document Released: 06/16/2011 Document Revised: 04/17/2014 Document Reviewed: 11/02/2013 [facility] Patient Information 2015 [place], [place]. This information is not intended to replace advice given to you by your health care provider. Make sure you discuss any questions you have with your health care provider.

## 2014-08-27 DIAGNOSIS — N763 Subacute and chronic vulvitis: Secondary | ICD-10-CM | POA: Insufficient documentation

## 2014-08-27 NOTE — Assessment & Plan Note (Signed)

## 2014-08-27 NOTE — Assessment & Plan Note (Addendum)
Home BPs have not been checked,  And the last 3 office notes (mine nad Dr Enzo Bi) note Stage 1 hypertension.  Will start hctz for management. She will return for baseline assesment of renal function and liver function   Lab Results  Component Value Date   CREATININE 0.8 03/23/2013

## 2014-08-27 NOTE — Assessment & Plan Note (Signed)
Managed by Dr Enzo Bi,  Biopsy done July 2014 prior to initiation of steroid therapy

## 2014-08-28 ENCOUNTER — Other Ambulatory Visit (INDEPENDENT_AMBULATORY_CARE_PROVIDER_SITE_OTHER): Payer: Medicare Other

## 2014-08-28 DIAGNOSIS — R5383 Other fatigue: Secondary | ICD-10-CM | POA: Diagnosis not present

## 2014-08-28 DIAGNOSIS — Z13220 Encounter for screening for lipoid disorders: Secondary | ICD-10-CM

## 2014-08-28 DIAGNOSIS — R5381 Other malaise: Secondary | ICD-10-CM

## 2014-08-28 DIAGNOSIS — R030 Elevated blood-pressure reading, without diagnosis of hypertension: Secondary | ICD-10-CM

## 2014-08-28 LAB — CBC WITH DIFFERENTIAL/PLATELET
Basophils Absolute: 0 10*3/uL (ref 0.0–0.1)
Basophils Relative: 0.8 % (ref 0.0–3.0)
Eosinophils Absolute: 0.1 10*3/uL (ref 0.0–0.7)
Eosinophils Relative: 2.2 % (ref 0.0–5.0)
HCT: 42.3 % (ref 36.0–46.0)
Hemoglobin: 14.3 g/dL (ref 12.0–15.0)
LYMPHS ABS: 1 10*3/uL (ref 0.7–4.0)
LYMPHS PCT: 31.9 % (ref 12.0–46.0)
MCHC: 33.8 g/dL (ref 30.0–36.0)
MCV: 96.6 fl (ref 78.0–100.0)
Monocytes Absolute: 0.3 10*3/uL (ref 0.1–1.0)
Monocytes Relative: 10.7 % (ref 3.0–12.0)
NEUTROS PCT: 54.4 % (ref 43.0–77.0)
Neutro Abs: 1.8 10*3/uL (ref 1.4–7.7)
Platelets: 181 10*3/uL (ref 150.0–400.0)
RBC: 4.38 Mil/uL (ref 3.87–5.11)
RDW: 13.2 % (ref 11.5–15.5)
WBC: 3.3 10*3/uL — ABNORMAL LOW (ref 4.0–10.5)

## 2014-08-28 LAB — LIPID PANEL
Cholesterol: 234 mg/dL — ABNORMAL HIGH (ref 0–200)
HDL: 73.4 mg/dL (ref >39.00)
LDL Cholesterol: 153 mg/dL — ABNORMAL HIGH (ref 0–99)
NONHDL: 160.6
TRIGLYCERIDES: 40 mg/dL (ref 0.0–149.0)
Total CHOL/HDL Ratio: 3
VLDL: 8 mg/dL (ref 0.0–40.0)

## 2014-08-28 LAB — COMPREHENSIVE METABOLIC PANEL
ALK PHOS: 46 U/L (ref 39–117)
ALT: 25 U/L (ref 0–35)
AST: 25 U/L (ref 0–37)
Albumin: 3.8 g/dL (ref 3.5–5.2)
BILIRUBIN TOTAL: 0.5 mg/dL (ref 0.2–1.2)
BUN: 18 mg/dL (ref 6–23)
CO2: 29 meq/L (ref 19–32)
Calcium: 9.3 mg/dL (ref 8.4–10.5)
Chloride: 104 mEq/L (ref 96–112)
Creatinine, Ser: 0.7 mg/dL (ref 0.4–1.2)
GFR: 84.52 mL/min (ref >60.00)
Glucose, Bld: 87 mg/dL (ref 70–99)
Potassium: 4.4 mEq/L (ref 3.5–5.1)
Sodium: 140 mEq/L (ref 135–145)
Total Protein: 7.3 g/dL (ref 6.0–8.3)

## 2014-08-28 LAB — MICROALBUMIN / CREATININE URINE RATIO
Creatinine,U: 118 mg/dL
Microalb Creat Ratio: 0.3 mg/g (ref 0.0–30.0)
Microalb, Ur: 0.3 mg/dL (ref 0.0–1.9)

## 2014-08-28 LAB — TSH: TSH: 0.8 u[IU]/mL (ref 0.35–4.50)

## 2014-08-29 ENCOUNTER — Encounter: Payer: Self-pay | Admitting: *Deleted

## 2014-09-18 DIAGNOSIS — Z23 Encounter for immunization: Secondary | ICD-10-CM | POA: Diagnosis not present

## 2015-01-26 DIAGNOSIS — M65341 Trigger finger, right ring finger: Secondary | ICD-10-CM | POA: Diagnosis not present

## 2015-01-26 DIAGNOSIS — S83411A Sprain of medial collateral ligament of right knee, initial encounter: Secondary | ICD-10-CM | POA: Diagnosis not present

## 2015-01-26 DIAGNOSIS — G5601 Carpal tunnel syndrome, right upper limb: Secondary | ICD-10-CM | POA: Diagnosis not present

## 2015-01-26 DIAGNOSIS — M19041 Primary osteoarthritis, right hand: Secondary | ICD-10-CM | POA: Diagnosis not present

## 2015-01-26 DIAGNOSIS — M25561 Pain in right knee: Secondary | ICD-10-CM | POA: Diagnosis not present

## 2015-07-03 DIAGNOSIS — M25462 Effusion, left knee: Secondary | ICD-10-CM | POA: Diagnosis not present

## 2015-07-03 DIAGNOSIS — M25562 Pain in left knee: Secondary | ICD-10-CM | POA: Diagnosis not present

## 2015-07-05 ENCOUNTER — Other Ambulatory Visit: Payer: Self-pay | Admitting: Orthopedic Surgery

## 2015-07-05 DIAGNOSIS — M25462 Effusion, left knee: Secondary | ICD-10-CM

## 2015-07-05 DIAGNOSIS — M25562 Pain in left knee: Secondary | ICD-10-CM

## 2015-07-13 ENCOUNTER — Ambulatory Visit
Admission: RE | Admit: 2015-07-13 | Discharge: 2015-07-13 | Disposition: A | Discharge status: Home / Self Care | Payer: Medicare Other | Source: Ambulatory Visit | Attending: Orthopedic Surgery | Admitting: Orthopedic Surgery

## 2015-07-13 DIAGNOSIS — M1712 Unilateral primary osteoarthritis, left knee: Secondary | ICD-10-CM | POA: Insufficient documentation

## 2015-07-13 DIAGNOSIS — M25562 Pain in left knee: Secondary | ICD-10-CM

## 2015-07-13 DIAGNOSIS — M94262 Chondromalacia, left knee: Secondary | ICD-10-CM | POA: Diagnosis not present

## 2015-07-13 DIAGNOSIS — M7122 Synovial cyst of popliteal space [Baker], left knee: Secondary | ICD-10-CM | POA: Diagnosis not present

## 2015-07-13 DIAGNOSIS — M25462 Effusion, left knee: Secondary | ICD-10-CM | POA: Insufficient documentation

## 2015-07-25 DIAGNOSIS — S83232D Complex tear of medial meniscus, current injury, left knee, subsequent encounter: Secondary | ICD-10-CM | POA: Diagnosis not present

## 2015-07-30 DIAGNOSIS — Z9882 Breast implant status: Secondary | ICD-10-CM | POA: Diagnosis not present

## 2015-07-30 DIAGNOSIS — Z1231 Encounter for screening mammogram for malignant neoplasm of breast: Secondary | ICD-10-CM | POA: Diagnosis not present

## 2015-07-30 LAB — HM MAMMOGRAPHY

## 2015-07-31 ENCOUNTER — Encounter
Admission: RE | Admit: 2015-07-31 | Discharge: 2015-07-31 | Disposition: A | Discharge status: Home / Self Care | Payer: Medicare Other | Source: Ambulatory Visit | Attending: Orthopedic Surgery | Admitting: Orthopedic Surgery

## 2015-07-31 DIAGNOSIS — S83232A Complex tear of medial meniscus, current injury, left knee, initial encounter: Secondary | ICD-10-CM | POA: Diagnosis present

## 2015-07-31 DIAGNOSIS — Z01810 Encounter for preprocedural cardiovascular examination: Secondary | ICD-10-CM | POA: Diagnosis not present

## 2015-07-31 DIAGNOSIS — Z79899 Other long term (current) drug therapy: Secondary | ICD-10-CM | POA: Diagnosis not present

## 2015-07-31 DIAGNOSIS — Z881 Allergy status to other antibiotic agents status: Secondary | ICD-10-CM | POA: Diagnosis not present

## 2015-07-31 NOTE — Patient Instructions (Signed)
  Your procedure is scheduled on: 08/02/15 Thurs.  Report to Day Surgery. To find out your arrival time please call [phone] between 1PM - 3PM on 08/01/15 Wed.  Remember: Instructions that are not followed completely may result in serious medical risk, up to and including death, or upon the discretion of your surgeon and anesthesiologist your surgery may need to be rescheduled.    _x___ 1. Do not eat food or drink liquids after midnight. No gum chewing or hard candies.     ____ 2. No Alcohol for 24 hours before or after surgery.   ____ 3. Bring all medications with you on the day of surgery if instructed.    __x__ 4. Notify your doctor if there is any change in your medical condition     (cold, fever, infections).     Do not wear jewelry, make-up, hairpins, clips or nail polish.  Do not wear lotions, powders, or perfumes. You may wear deodorant.  Do not shave 48 hours prior to surgery. Men may shave face and neck.  Do not bring valuables to the hospital.    [facility] is not responsible for any belongings or valuables.               Contacts, dentures or bridgework may not be worn into surgery.  Leave your suitcase in the car. After surgery it may be brought to your room.  For patients admitted to the hospital, discharge time is determined by your                treatment team.   Patients discharged the day of surgery will not be allowed to drive home.   Please read over the following fact sheets that you were given:      ____ Take these medicines the morning of surgery with A SIP OF WATER:    1. None  2.   3.   4.  5.  6.  ____ Fleet Enema (as directed)   __x__ Use CHG Soap as directed  ____ Use inhalers on the day of surgery  ____ Stop metformin 2 days prior to surgery    ____ Take 1/2 of usual insulin dose the night before surgery and none on the morning of surgery.   ____ Stop Coumadin/Plavix/aspirin on  __x__ Stop Anti-inflammatories on Stop aleve  today   __x__ Stop supplements until after surgery.  Stop Vitamin E  ____ Bring C-Pap to the hospital.

## 2015-08-01 ENCOUNTER — Encounter: Payer: Self-pay | Admitting: Internal Medicine

## 2015-08-02 ENCOUNTER — Ambulatory Visit
Admission: RE | Admit: 2015-08-02 | Discharge: 2015-08-02 | Disposition: A | Discharge status: Home / Self Care | Payer: Medicare Other | Source: Ambulatory Visit | Attending: Orthopedic Surgery | Admitting: Orthopedic Surgery

## 2015-08-02 ENCOUNTER — Ambulatory Visit: Payer: Medicare Other | Admitting: Anesthesiology

## 2015-08-02 ENCOUNTER — Encounter
Admission: RE | Disposition: A | Discharge status: Home / Self Care | Payer: Self-pay | Source: Ambulatory Visit | Attending: Orthopedic Surgery

## 2015-08-02 ENCOUNTER — Encounter: Payer: Self-pay | Admitting: *Deleted

## 2015-08-02 DIAGNOSIS — M23303 Other meniscus derangements, unspecified medial meniscus, right knee: Secondary | ICD-10-CM | POA: Diagnosis not present

## 2015-08-02 DIAGNOSIS — Z79899 Other long term (current) drug therapy: Secondary | ICD-10-CM | POA: Diagnosis not present

## 2015-08-02 DIAGNOSIS — Z881 Allergy status to other antibiotic agents status: Secondary | ICD-10-CM | POA: Diagnosis not present

## 2015-08-02 DIAGNOSIS — S83232A Complex tear of medial meniscus, current injury, left knee, initial encounter: Secondary | ICD-10-CM | POA: Diagnosis not present

## 2015-08-02 HISTORY — PX: KNEE ARTHROSCOPY: SHX127

## 2015-08-02 SURGERY — ARTHROSCOPY, KNEE
Anesthesia: General | Laterality: Left | Wound class: Clean

## 2015-08-02 MED ORDER — FAMOTIDINE 20 MG PO TABS
20.0000 mg | ORAL_TABLET | Freq: Once | ORAL | Status: AC
  Administered 2015-08-02: 20 mg via ORAL

## 2015-08-02 MED ORDER — PHENYLEPHRINE HCL 10 MG/ML IJ SOLN
INTRAMUSCULAR | Status: DC | PRN
  Administered 2015-08-02: 100 ug via INTRAVENOUS

## 2015-08-02 MED ORDER — LACTATED RINGERS IV SOLN
INTRAVENOUS | Status: DC
  Administered 2015-08-02: 07:00:00 via INTRAVENOUS

## 2015-08-02 MED ORDER — GLYCOPYRROLATE 0.2 MG/ML IJ SOLN
INTRAMUSCULAR | Status: DC | PRN
  Administered 2015-08-02: 0.2 mg via INTRAVENOUS

## 2015-08-02 MED ORDER — DEXAMETHASONE SODIUM PHOSPHATE 4 MG/ML IJ SOLN
INTRAMUSCULAR | Status: DC | PRN
  Administered 2015-08-02: 5 mg via INTRAVENOUS

## 2015-08-02 MED ORDER — ONDANSETRON HCL 4 MG/2ML IJ SOLN: 4.0000 mg | Freq: Once | INTRAMUSCULAR | Status: DC | PRN

## 2015-08-02 MED ORDER — FENTANYL CITRATE (PF) 100 MCG/2ML IJ SOLN
INTRAMUSCULAR | Status: DC | PRN
  Administered 2015-08-02: 50 ug via INTRAVENOUS
  Administered 2015-08-02: 100 ug via INTRAVENOUS

## 2015-08-02 MED ORDER — FENTANYL CITRATE (PF) 100 MCG/2ML IJ SOLN: 25.0000 ug | INTRAMUSCULAR | Status: DC | PRN

## 2015-08-02 MED ORDER — LIDOCAINE HCL (PF) 1 % IJ SOLN
INTRAMUSCULAR | Status: AC
  Filled 2015-08-02: qty 2

## 2015-08-02 MED ORDER — KETAMINE HCL 50 MG/ML IJ SOLN
INTRAMUSCULAR | Status: DC | PRN
  Administered 2015-08-02: 25 mg via INTRAMUSCULAR

## 2015-08-02 MED ORDER — BUPIVACAINE-EPINEPHRINE (PF) 0.5% -1:200000 IJ SOLN
INTRAMUSCULAR | Status: AC
  Filled 2015-08-02: qty 30

## 2015-08-02 MED ORDER — ACETAMINOPHEN 10 MG/ML IV SOLN
INTRAVENOUS | Status: DC | PRN
  Administered 2015-08-02: 1000 mg via INTRAVENOUS

## 2015-08-02 MED ORDER — ONDANSETRON HCL 4 MG/2ML IJ SOLN
INTRAMUSCULAR | Status: DC | PRN
  Administered 2015-08-02: 4 mg via INTRAVENOUS

## 2015-08-02 MED ORDER — MIDAZOLAM HCL 2 MG/2ML IJ SOLN
INTRAMUSCULAR | Status: DC | PRN
  Administered 2015-08-02: 2 mg via INTRAVENOUS

## 2015-08-02 MED ORDER — LIDOCAINE HCL (CARDIAC) 20 MG/ML IV SOLN
INTRAVENOUS | Status: DC | PRN
  Administered 2015-08-02: 100 mg via INTRAVENOUS

## 2015-08-02 MED ORDER — FAMOTIDINE 20 MG PO TABS
ORAL_TABLET | ORAL | Status: AC
  Administered 2015-08-02: 20 mg via ORAL
  Filled 2015-08-02: qty 1

## 2015-08-02 MED ORDER — PROPOFOL 10 MG/ML IV BOLUS
INTRAVENOUS | Status: DC | PRN
  Administered 2015-08-02: 150 mg via INTRAVENOUS

## 2015-08-02 MED ORDER — ACETAMINOPHEN 10 MG/ML IV SOLN
INTRAVENOUS | Status: AC
  Filled 2015-08-02: qty 100

## 2015-08-02 MED ORDER — HYDROCODONE-ACETAMINOPHEN 5-325 MG PO TABS: 1.0000 | ORAL_TABLET | Freq: Four times a day (QID) | ORAL | Status: DC | PRN

## 2015-08-02 SURGICAL SUPPLY — 28 items
BANDAGE ELASTIC 4 CLIP NS LF (GAUZE/BANDAGES/DRESSINGS) ×3 IMPLANT
BANDAGE ELASTIC 4 CLIP ST LF (GAUZE/BANDAGES/DRESSINGS) IMPLANT
BLADE FULL RADIUS 3.5 (BLADE) IMPLANT
BLADE INCISOR PLUS 4.5 (BLADE) IMPLANT
BLADE SHAVER 4.5 DBL SERAT CV (CUTTER) IMPLANT
BLADE SHAVER 4.5X7 STR FR (MISCELLANEOUS) IMPLANT
CHLORAPREP W/TINT 26ML (MISCELLANEOUS) ×3 IMPLANT
CUTTER AGGRESSIVE+ 3.5 (CUTTER) IMPLANT
GAUZE PETRO XEROFOAM 1X8 (MISCELLANEOUS) ×3 IMPLANT
GAUZE SPONGE 4X4 12PLY STRL (GAUZE/BANDAGES/DRESSINGS) ×3 IMPLANT
GLOVE BIOGEL PI IND STRL 9 (GLOVE) ×1 IMPLANT
GLOVE BIOGEL PI INDICATOR 9 (GLOVE) ×2
GLOVE SURG ORTHO 9.0 STRL STRW (GLOVE) ×3 IMPLANT
GOWN SPECIALTY ULTRA XL (MISCELLANEOUS) ×3 IMPLANT
GOWN STRL REUS W/ TWL LRG LVL3 (GOWN DISPOSABLE) ×1 IMPLANT
GOWN STRL REUS W/TWL LRG LVL3 (GOWN DISPOSABLE) ×2
IV LACTATED RINGER IRRG 3000ML (IV SOLUTION) ×4
IV LR IRRIG 3000ML ARTHROMATIC (IV SOLUTION) ×2 IMPLANT
KIT RM TURNOVER STRD PROC AR (KITS) ×3 IMPLANT
MANIFOLD NEPTUNE II (INSTRUMENTS) ×3 IMPLANT
PACK ARTHROSCOPY KNEE (MISCELLANEOUS) ×3 IMPLANT
SET TUBE SUCT SHAVER OUTFL 24K (TUBING) ×3 IMPLANT
SET TUBE TIP INTRA-ARTICULAR (MISCELLANEOUS) ×3 IMPLANT
SUT ETHILON 4-0 (SUTURE)
SUT ETHILON 4-0 FS2 18XMFL BLK (SUTURE)
SUTURE ETHLN 4-0 FS2 18XMF BLK (SUTURE) IMPLANT
TUBING ARTHRO INFLOW-ONLY STRL (TUBING) ×3 IMPLANT
WAND HAND CNTRL MULTIVAC 50 (MISCELLANEOUS) ×3 IMPLANT

## 2015-08-02 NOTE — Anesthesia Preprocedure Evaluation (Signed)
Anesthesia Evaluation  Patient identified by MRN, date of birth, ID band Patient awake    Reviewed: Allergy & Precautions, NPO status , Patient's Chart, lab work & pertinent test results  Airway Mallampati: II  TM Distance: >3 FB     Dental  (+) Caps   Pulmonary neg pulmonary ROS,    Pulmonary exam normal       Cardiovascular Normal cardiovascular exam+ Valvular Problems/Murmurs     Neuro/Psych  Headaches, negative psych ROS   GI/Hepatic Neg liver ROS, diverticulitis   Endo/Other  negative endocrine ROS  Renal/GU negative Renal ROS  negative genitourinary   Musculoskeletal negative musculoskeletal ROS (+)   Abdominal Normal abdominal exam  (+)   Peds negative pediatric ROS (+)  Hematology negative hematology ROS (+)   Anesthesia Other Findings   Reproductive/Obstetrics                             Anesthesia Physical Anesthesia Plan  ASA: II  Anesthesia Plan: General   Post-op Pain Management:    Induction:   Airway Management Planned: LMA  Additional Equipment:   Intra-op Plan:   Post-operative Plan: Extubation in OR  Informed Consent: I have reviewed the patients History and Physical, chart, labs and discussed the procedure including the risks, benefits and alternatives for the proposed anesthesia with the patient or authorized representative who has indicated his/her understanding and acceptance.   Dental advisory given  Plan Discussed with: CRNA and Surgeon  Anesthesia Plan Comments:         Anesthesia Quick Evaluation

## 2015-08-02 NOTE — Anesthesia Postprocedure Evaluation (Signed)
  Anesthesia Post-op Note  Patient: Shannon Hickman  Procedure(s) Performed: Procedure(s): ARTHROSCOPY KNEE, PARTIAL MEDIAL MENISCECTOMY (Left)  Anesthesia type:General  Patient location: PACU  Post pain: Pain level controlled  Post assessment: Post-op Vital signs reviewed, Patient's Cardiovascular Status Stable, Respiratory Function Stable, Patent Airway and No signs of Nausea or vomiting  Post vital signs: Reviewed and stable  Last Vitals:  Filed Vitals:   08/02/15 1059  BP: 157/62  Pulse: 73  Temp: 36.8 C  Resp: 16    Level of consciousness: awake, alert  and patient cooperative  Complications: No apparent anesthesia complications

## 2015-08-02 NOTE — H&P (Signed)
Reviewed paper H+P, will be scanned into chart. No changes noted.  

## 2015-08-02 NOTE — Transfer of Care (Signed)
Immediate Anesthesia Transfer of Care Note  Patient: Shannon Hickman  Procedure(s) Performed: Procedure(s): ARTHROSCOPY KNEE, PARTIAL MEDIAL MENISCECTOMY (Left)  Patient Location: PACU  Anesthesia Type:General  Level of Consciousness: awake, alert , oriented and patient cooperative  Airway & Oxygen Therapy: Patient Spontanous Breathing and Patient connected to nasal cannula oxygen  Post-op Assessment: Report given to RN and Post -op Vital signs reviewed and stable  Post vital signs: Reviewed and stable  Last Vitals:  Filed Vitals:   08/02/15 0913  BP: 130/66  Pulse: 71  Temp:   Resp: 11    Complications: No apparent anesthesia complications

## 2015-08-02 NOTE — Anesthesia Procedure Notes (Signed)
Procedure Name: LMA Insertion Date/Time: 08/02/2015 8:13 AM Performed by: Rosaria Ferries, Rorik Vespa Pre-anesthesia Checklist: Patient identified, Emergency Drugs available, Suction available and Patient being monitored Patient Re-evaluated:Patient Re-evaluated prior to inductionOxygen Delivery Method: Circle system utilized Preoxygenation: Pre-oxygenation with 100% oxygen Intubation Type: IV induction LMA Size: 4.0

## 2015-08-02 NOTE — Op Note (Signed)
08/02/2015  9:10 AM  PATIENT:  Shannon Hickman  69 y.o. female  PRE-OPERATIVE DIAGNOSIS:  complex tear medial meniscus left knee  POST-OPERATIVE DIAGNOSIS:  complex tear medial meniscus  PROCEDURE:  Procedure(s): ARTHROSCOPY KNEE, PARTIAL MEDIAL MENISCECTOMY (Left)  SURGEON: Laurene Footman, MD  ASSISTANTS: None  ANESTHESIA:   general  EBL:  Total I/O In: 600 [I.V.:600] Out: -   BLOOD ADMINISTERED:none  DRAINS: none   LOCAL MEDICATIONS USED:  MARCAINE     SPECIMEN:  No Specimen  DISPOSITION OF SPECIMEN:  N/A  COUNTS:  YES  TOURNIQUET:  * No tourniquets in log *  IMPLANTS: None  DICTATION: .Dragon Dictation patient was brought the operating room and after adequate general anesthesia was obtained left leg was placed in arthroscopic leg holder with tourniquet which was not required the left leg was prepped and draped in sterile fashion after patient education timeout procedure completed, the arthroscope was introduced and inferior lateral portal and the knee examined there is moderate synovitis throughout the knee. Moderate patellofemoral degenerative change with no exposed bone coming around medially and inferior medial portal was made on probing there is a complex tear of the entire posterior third of the meniscus with significant cartilage loss on the posterior aspect of the tibial condyle and fibrillation and fissuring of the femoral condyle no exposed bone on either side of the joint anterior cruciate ligament was intact and lateral compartment was essentially normal very mild grade 1 changes to the tibia and femoral condyles gutters were free of any loose bodies this point meniscal punch and arthroscopic shaver and ArthriCare wand were used to do cut the medial meniscus back to a stable margin. 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

## 2015-08-02 NOTE — Discharge Instructions (Signed)
Keep dressing on clean and dry. If bandage does slide down the leg, remove entire bandage, covered to incisions with Band-Aids and reapply the Ace wrap leave the remaining bandage off. Take one aspirin a day either 81 or 325 mg.

## 2015-08-28 DIAGNOSIS — Z23 Encounter for immunization: Secondary | ICD-10-CM | POA: Diagnosis not present

## 2015-08-31 ENCOUNTER — Encounter: Payer: Self-pay | Admitting: Internal Medicine

## 2015-08-31 ENCOUNTER — Ambulatory Visit (INDEPENDENT_AMBULATORY_CARE_PROVIDER_SITE_OTHER): Payer: Medicare Other | Admitting: Internal Medicine

## 2015-08-31 VITALS — BP 138/78 | HR 74 | Temp 98.3°F | Resp 12 | Ht 64.0 in | Wt 146.1 lb

## 2015-08-31 DIAGNOSIS — M8580 Other specified disorders of bone density and structure, unspecified site: Secondary | ICD-10-CM

## 2015-08-31 DIAGNOSIS — R030 Elevated blood-pressure reading, without diagnosis of hypertension: Secondary | ICD-10-CM | POA: Diagnosis not present

## 2015-08-31 DIAGNOSIS — E785 Hyperlipidemia, unspecified: Secondary | ICD-10-CM | POA: Diagnosis not present

## 2015-08-31 DIAGNOSIS — R5383 Other fatigue: Secondary | ICD-10-CM | POA: Diagnosis not present

## 2015-08-31 DIAGNOSIS — Z0000 Encounter for general adult medical examination without abnormal findings: Secondary | ICD-10-CM

## 2015-08-31 DIAGNOSIS — Z113 Encounter for screening for infections with a predominantly sexual mode of transmission: Secondary | ICD-10-CM | POA: Diagnosis not present

## 2015-08-31 LAB — COMPREHENSIVE METABOLIC PANEL
ALK PHOS: 50 U/L (ref 39–117)
ALT: 15 U/L (ref 0–35)
AST: 20 U/L (ref 0–37)
Albumin: 4.1 g/dL (ref 3.5–5.2)
BUN: 21 mg/dL (ref 6–23)
CO2: 28 mEq/L (ref 19–32)
Calcium: 9.6 mg/dL (ref 8.4–10.5)
Chloride: 104 mEq/L (ref 96–112)
Creatinine, Ser: 0.71 mg/dL (ref 0.40–1.20)
GFR: 87.01 mL/min (ref >60.00)
GLUCOSE: 86 mg/dL (ref 70–99)
POTASSIUM: 4.7 meq/L (ref 3.5–5.1)
SODIUM: 140 meq/L (ref 135–145)
TOTAL PROTEIN: 7.1 g/dL (ref 6.0–8.3)
Total Bilirubin: 0.3 mg/dL (ref 0.2–1.2)

## 2015-08-31 LAB — HEPATITIS C ANTIBODY: HCV Ab: NEGATIVE

## 2015-08-31 LAB — LIPID PANEL
CHOLESTEROL: 195 mg/dL (ref 0–200)
HDL: 66 mg/dL (ref >39.00)
LDL CALC: 115 mg/dL — AB (ref 0–99)
NonHDL: 129.41
TRIGLYCERIDES: 73 mg/dL (ref 0.0–149.0)
Total CHOL/HDL Ratio: 3
VLDL: 14.6 mg/dL (ref 0.0–40.0)

## 2015-08-31 LAB — TSH: TSH: 0.57 u[IU]/mL (ref 0.35–4.50)

## 2015-08-31 NOTE — Patient Instructions (Addendum)
DEXA scan ordered  I recommend getting the majority of your calcium and Vitamin D  through diet rather than supplements given the recent association of calcium supplements with increased coronary artery calcium scores (You need 1200 mg daily )   Unsweetened almond/coconut milk is a great low calorie low carb, cholesterol free  way to increase your dietary calcium and vitamin D.   Your blood pressure is elevated but not diagnostic of hypertension (yet) Please get it checked a few times over the next 62 months and send me the readings    Health Maintenance Adopting a healthy lifestyle and getting preventive care can go a long way to promote health and wellness. Talk with your health care provider about what schedule of regular examinations is right for you. This is a good chance for you to check in with your provider about disease prevention and staying healthy. In between checkups, there are plenty of things you can do on your own. Experts have done a lot of research about which lifestyle changes and preventive measures are most likely to keep you healthy. Ask your health care provider for more information. WEIGHT AND DIET  Eat a healthy diet  Be sure to include plenty of vegetables, fruits, low-fat dairy products, and lean protein.  Do not eat a lot of foods high in solid fats, added sugars, or salt.  Get regular exercise. This is one of the most important things you can do for your health.  Most adults should exercise for at least 150 minutes each week. The exercise should increase your heart rate and make you sweat (moderate-intensity exercise).  Most adults should also do strengthening exercises at least twice a week. This is in addition to the moderate-intensity exercise.  Maintain a healthy weight  Body mass index (BMI) is a measurement that can be used to identify possible weight problems. It estimates body fat based on height and weight. Your health care provider can help determine  your BMI and help you achieve or maintain a healthy weight.  For females 62 years of age and older:   A BMI below 18.5 is considered underweight.  A BMI of 18.5 to 24.9 is normal.  A BMI of 25 to 29.9 is considered overweight.  A BMI of 30 and above is considered obese.  Watch levels of cholesterol and blood lipids  You should start having your blood tested for lipids and cholesterol at 68 years of age, then have this test every 5 years.  You may need to have your cholesterol levels checked more often if:  Your lipid or cholesterol levels are high.  You are older than 68 years of age.  You are at high risk for heart disease.  CANCER SCREENING   Lung Cancer  Lung cancer screening is recommended for adults 13-38 years old who are at high risk for lung cancer because of a history of smoking.  A yearly low-dose CT scan of the lungs is recommended for people who:  Currently smoke.  Have quit within the past 15 years.  Have at least a 30-pack-year history of smoking. A pack year is smoking an average of one pack of cigarettes a day for 1 year.  Yearly screening should continue until it has been 15 years since you quit.  Yearly screening should stop if you develop a health problem that would prevent you from having lung cancer treatment.  Breast Cancer  Practice breast self-awareness. This means understanding how your breasts normally appear and feel.  feel.  It also means doing regular breast self-exams. Let your health care provider know about any changes, no matter how small.  If you are in your 20s or 30s, you should have a clinical breast exam (CBE) by a health care provider every 1-3 years as part of a regular health exam.  If you are 40 or older, have a CBE every year. Also consider having a breast X-ray (mammogram) every year.  If you have a family history of breast cancer, talk to your health care provider about genetic screening.  If you are at high risk for breast  cancer, talk to your health care provider about having an MRI and a mammogram every year.  Breast cancer gene (BRCA) assessment is recommended for women who have family members with BRCA-related cancers. BRCA-related cancers include:  Breast.  Ovarian.  Tubal.  Peritoneal cancers.  Results of the assessment will determine the need for genetic counseling and BRCA1 and BRCA2 testing. Cervical Cancer Routine pelvic examinations to screen for cervical cancer are no longer recommended for nonpregnant women who are considered low risk for cancer of the pelvic organs (ovaries, uterus, and vagina) and who do not have symptoms. A pelvic examination may be necessary if you have symptoms including those associated with pelvic infections. Ask your health care provider if a screening pelvic exam is right for you.   The Pap test is the screening test for cervical cancer for women who are considered at risk.  If you had a hysterectomy for a problem that was not cancer or a condition that could lead to cancer, then you no longer need Pap tests.  If you are older than 65 years, and you have had normal Pap tests for the past 10 years, you no longer need to have Pap tests.  If you have had past treatment for cervical cancer or a condition that could lead to cancer, you need Pap tests and screening for cancer for at least 20 years after your treatment.  If you no longer get a Pap test, assess your risk factors if they change (such as having a new sexual partner). This can affect whether you should start being screened again.  Some women have medical problems that increase their chance of getting cervical cancer. If this is the case for you, your health care provider may recommend more frequent screening and Pap tests.  The human papillomavirus (HPV) test is another test that may be used for cervical cancer screening. The HPV test looks for the virus that can cause cell changes in the cervix. The cells  collected during the Pap test can be tested for HPV.  The HPV test can be used to screen women 30 years of age and older. Getting tested for HPV can extend the interval between normal Pap tests from three to five years.  An HPV test also should be used to screen women of any age who have unclear Pap test results.  After 68 years of age, women should have HPV testing as often as Pap tests.  Colorectal Cancer  This type of cancer can be detected and often prevented.  Routine colorectal cancer screening usually begins at 68 years of age and continues through 68 years of age.  Your health care provider may recommend screening at an earlier age if you have risk factors for colon cancer.  Your health care provider may also recommend using home test kits to check for hidden blood in the stool.  A small camera   at the end of a tube can be used to examine your colon directly (sigmoidoscopy or colonoscopy). This is done to check for the earliest forms of colorectal cancer.  Routine screening usually begins at age 50.  Direct examination of the colon should be repeated every 5-10 years through 68 years of age. However, you may need to be screened more often if early forms of precancerous polyps or small growths are found. Skin Cancer  Check your skin from head to toe regularly.  Tell your health care provider about any new moles or changes in moles, especially if there is a change in a mole's shape or color.  Also tell your health care provider if you have a mole that is larger than the size of a pencil eraser.  Always use sunscreen. Apply sunscreen liberally and repeatedly throughout the day.  Protect yourself by wearing long sleeves, pants, a wide-brimmed hat, and sunglasses whenever you are outside. HEART DISEASE, DIABETES, AND HIGH BLOOD PRESSURE   Have your blood pressure checked at least every 1-2 years. High blood pressure causes heart disease and increases the risk of stroke.  If  you are between 55 years and 79 years old, ask your health care provider if you should take aspirin to prevent strokes.  Have regular diabetes screenings. This involves taking a blood sample to check your fasting blood sugar level.  If you are at a normal weight and have a low risk for diabetes, have this test once every three years after 68 years of age.  If you are overweight and have a high risk for diabetes, consider being tested at a younger age or more often. PREVENTING INFECTION  Hepatitis B  If you have a higher risk for hepatitis B, you should be screened for this virus. You are considered at high risk for hepatitis B if:  You were born in a country where hepatitis B is common. Ask your health care provider which countries are considered high risk.  Your parents were born in a high-risk country, and you have not been immunized against hepatitis B (hepatitis B vaccine).  You have HIV or AIDS.  You use needles to inject street drugs.  You live with someone who has hepatitis B.  You have had sex with someone who has hepatitis B.  You get hemodialysis treatment.  You take certain medicines for conditions, including cancer, organ transplantation, and autoimmune conditions. Hepatitis C  Blood testing is recommended for:  Everyone born from 1945 through 1965.  Anyone with known risk factors for hepatitis C. Sexually transmitted infections (STIs)  You should be screened for sexually transmitted infections (STIs) including gonorrhea and chlamydia if:  You are sexually active and are younger than 68 years of age.  You are older than 68 years of age and your health care provider tells you that you are at risk for this type of infection.  Your sexual activity has changed since you were last screened and you are at an increased risk for chlamydia or gonorrhea. Ask your health care provider if you are at risk.  If you do not have HIV, but are at risk, it may be recommended that  you take a prescription medicine daily to prevent HIV infection. This is called pre-exposure prophylaxis (PrEP). You are considered at risk if:  You are sexually active and do not regularly use condoms or know the HIV status of your partner(s).  You take drugs by injection.  You are sexually active with a partner   HIV. Talk with your health care provider about whether you are at high risk of being infected with HIV. If you choose to begin PrEP, you should first be tested for HIV. You should then be tested every 3 months for as long as you are taking PrEP.  PREGNANCY   If you are premenopausal and you may become pregnant, ask your health care provider about preconception counseling.  If you may become pregnant, take 400 to 800 micrograms (mcg) of folic acid every day.  If you want to prevent pregnancy, talk to your health care provider about birth control (contraception). OSTEOPOROSIS AND MENOPAUSE   Osteoporosis is a disease in which the bones lose minerals and strength with aging. This can result in serious bone fractures. Your risk for osteoporosis can be identified using a bone density scan.  If you are 27 years of age or older, or if you are at risk for osteoporosis and fractures, ask your health care provider if you should be screened.  Ask your health care provider whether you should take a calcium or vitamin D supplement to lower your risk for osteoporosis.  Menopause may have certain physical symptoms and risks.  Hormone replacement therapy may reduce some of these symptoms and risks. Talk to your health care provider about whether hormone replacement therapy is right for you.  HOME CARE INSTRUCTIONS   Schedule regular health, dental, and eye exams.  Stay current with your immunizations.   Do not use any tobacco products including cigarettes, chewing tobacco, or electronic cigarettes.  If you are pregnant, do not drink alcohol.  If you are breastfeeding, limit how  much and how often you drink alcohol.  Limit alcohol intake to no more than 1 drink per day for nonpregnant women. One drink equals 12 ounces of beer, 5 ounces of wine, or 1 ounces of hard liquor.  Do not use street drugs.  Do not share needles.  Ask your health care provider for help if you need support or information about quitting drugs.  Tell your health care provider if you often feel depressed.  Tell your health care provider if you have ever been abused or do not feel safe at home. Document Released: 06/16/2011 Document Revised: 04/17/2014 Document Reviewed: 11/02/2013 [facility] Patient Information 2015 [place], [place]. This information is not intended to replace advice given to you by your health care provider. Make sure you discuss any questions you have with your health care provider.

## 2015-08-31 NOTE — Progress Notes (Signed)
Patient ID: Shannon Hickman, female    DOB: [birth year]  Age: 69 y.o. MRN: [id number]  The patient is here for annual Medicare wellness examination and management of other chronic and acute problems.  PAP smear was normal  at age 37  Normal colooscopy by Elliott 2013 Normal mammogram aug 2016  Takes vitamin D daily   Left knee meniscal repair August Minz.     The risk factors are reflected in the social history.  The roster of all physicians providing medical care to patient - is listed in the Snapshot section of the chart.  Activities of daily living:  The patient is 100% independent in all ADLs: dressing, toileting, feeding as well as independent mobility  Home safety : The patient has smoke detectors in the home. They wear seatbelts.  There are no firearms at home. There is no violence in the home.   There is no risks for hepatitis, STDs or HIV. There is no   history of blood transfusion. They have no travel history to infectious disease endemic areas of the world.  The patient has seen their dentist in the last six month. They have seen their eye doctor in the last year. They admit to slight hearing difficulty with regard to whispered voices and some television programs.  They have deferred audiologic testing in the last year.  They do not  have excessive sun exposure. Discussed the need for sun protection: hats, long sleeves and use of sunscreen if there is significant sun exposure.   Diet: the importance of a healthy diet is discussed. They do have a healthy diet.  The benefits of regular aerobic exercise were discussed. She walks 4 times per week ,  20 minutes.   Depression screen: there are no signs or vegative symptoms of depression- irritability, change in appetite, anhedonia, sadness/tearfullness.  Cognitive assessment: the patient manages all their financial and personal affairs and is actively engaged. They could relate day,date,year and events; recalled 2/3 objects at 3  minutes; performed clock-face test normally.  The following portions of the patient's history were reviewed and updated as appropriate: allergies, current medications, past family history, past medical history,  past surgical history, past social history  and problem list.  Visual acuity was not assessed per patient preference since she has regular follow up with her ophthalmologist. Hearing and body mass index were assessed and reviewed.   During the course of the visit the patient was educated and counseled about appropriate screening and preventive services including : fall prevention , diabetes screening, nutrition counseling, colorectal cancer screening, and recommended immunizations.    CC: The primary encounter diagnosis was Osteopenia. Diagnoses of Screen for STD (sexually transmitted disease), Hyperlipidemia, Other fatigue, Prehypertension, Encounter for Medicare annual wellness exam, and Blood pressure elevated without history of HTN were also pertinent to this visit.  History Shannon Hickman has a past medical history of Chicken pox; Diverticulitis; Heart murmur; Migraine; Urinary tract infection; and Inflammatory polyps of colon.   She has past surgical history that includes HISTOPLASMOSIS; Appendectomy (1998); Partial colectomy; and Knee arthroscopy (Left, 08/02/2015).   Her family history includes Diabetes (age of onset: 29) in her father; Heart disease (age of onset: 63) in her father; Stomach cancer in her maternal grandfather. There is no history of Cancer.She reports that she has never smoked. She does not have any smokeless tobacco history on file. She reports that she does not drink alcohol. Her drug history is not on file.  Outpatient Prescriptions Prior to Visit  Medication Sig Dispense Refill  . acetaminophen (TYLENOL) 325 MG tablet Take 650 mg by mouth every 6 (six) hours as needed.    . calcium citrate (CALCITRATE - DOSED IN MG ELEMENTAL CALCIUM) 950 MG tablet Take 2 tablets by  mouth daily.    . cetirizine (ZYRTEC) 10 MG tablet Take 10 mg by mouth daily.     . Cholecalciferol (VITAMIN D3) 2000 UNITS TABS Take 1 tablet by mouth daily.    . fish oil-omega-3 fatty acids 1000 MG capsule Take 2 g by mouth daily.    . multivitamin-lutein (OCUVITE-LUTEIN) CAPS Take 1 capsule by mouth daily.    . naproxen sodium (ANAPROX) 220 MG tablet Take 220 mg by mouth 2 (two) times daily with a meal.     . vitamin E 400 UNIT capsule Take 400 Units by mouth daily.    Marland Kitchen HYDROcodone-acetaminophen (NORCO) 5-325 MG per tablet Take 1-2 tablets by mouth every 6 (six) hours as needed for moderate pain. (Patient not taking: Reported on 08/31/2015) 30 tablet 0   No facility-administered medications prior to visit.    Review of Systems   Patient denies headache, fevers, malaise, unintentional weight loss, skin rash, eye pain, sinus congestion and sinus pain, sore throat, dysphagia,  hemoptysis , cough, dyspnea, wheezing, chest pain, palpitations, orthopnea, edema, abdominal pain, nausea, melena, diarrhea, constipation, flank pain, dysuria, hematuria, urinary  Frequency, nocturia, numbness, tingling, seizures,  Focal weakness, Loss of consciousness,  Tremor, insomnia, depression, anxiety, and suicidal ideation.      Objective:  BP 138/78 mmHg  Pulse 74  Temp(Src) 98.3 F (36.8 C) (Oral)  Resp 12  Ht 5\' 4"  (1.626 m)  Wt 146 lb 2 oz (66.282 kg)  BMI 25.07 kg/m2  SpO2 99%  Physical Exam   General appearance: alert, cooperative and appears stated age Head: Normocephalic, without obvious abnormality, atraumatic Eyes: conjunctivae/corneas clear. PERRL, EOM's intact. Fundi benign. Ears: normal TM's and external ear canals both ears Nose: Nares normal. Septum midline. Mucosa normal. No drainage or sinus tenderness. Throat: lips, mucosa, and tongue normal; teeth and gums normal Neck: no adenopathy, no carotid bruit, no JVD, supple, symmetrical, trachea midline and thyroid not enlarged,  symmetric, no tenderness/mass/nodules Lungs: clear to auscultation bilaterally Breasts: normal appearance, no masses or tenderness Heart: regular rate and rhythm, S1, S2 normal, no murmur, click, rub or gallop Abdomen: soft, non-tender; bowel sounds normal; no masses,  no organomegaly Extremities: extremities normal, atraumatic, no cyanosis or edema Pulses: 2+ and symmetric Skin: Skin color, texture, turgor normal. No rashes or lesions Neurologic: Alert and oriented X 3, normal strength and tone. Normal symmetric reflexes. Normal coordination and gait.     Assessment & Plan:   Problem List Items Addressed This Visit      Unprioritized   Encounter for Medicare annual wellness exam    Annual Medicare wellness  exam was done as well as a comprehensive physical exam and management of acute and chronic conditions .  During the course of the visit the patient was educated and counseled about appropriate screening and preventive services including : fall prevention , diabetes screening, nutrition counseling, colorectal cancer screening, and recommended immunizations.  Printed recommendations for health maintenance screenings was given.   Lab Results  Component Value Date   CHOL 195 08/31/2015   HDL 66.00 08/31/2015   LDLCALC 115* 08/31/2015   LDLDIRECT 112.8 03/23/2013   TRIG 73.0 08/31/2015   CHOLHDL 3 08/31/2015         Blood pressure elevated  without history of HTN    sHe has no prior history of hypertension. sHe will check blood pressure several times over the next 3-4 weeks and to submit readings for evaluation. If elevated will start amlodipine 2.5 mg daily for persistent elevation.  She has normal renal function and normal lytes  Lab Results  Component Value Date   CREATININE 0.71 08/31/2015   Lab Results  Component Value Date   NA 140 08/31/2015   K 4.7 08/31/2015   CL 104 08/31/2015   CO2 28 08/31/2015         RESOLVED: Prehypertension   Osteopenia - Primary    Relevant Orders   DG Bone Density    Other Visit Diagnoses    Screen for STD (sexually transmitted disease)        Relevant Orders    HIV antibody (Completed)    Hepatitis C antibody (Completed)    Hyperlipidemia        Relevant Orders    Lipid panel (Completed)    Other fatigue        Relevant Orders    Comprehensive metabolic panel (Completed)    TSH (Completed)       I am having Ms. Crilly maintain her naproxen sodium, cetirizine, calcium citrate, multivitamin-lutein, fish oil-omega-3 fatty acids, acetaminophen, Vitamin D3, vitamin E, and HYDROcodone-acetaminophen.  No orders of the defined types were placed in this encounter.    There are no discontinued medications.  Follow-up: No Follow-up on file.   Crecencio Mc, MD

## 2015-08-31 NOTE — Progress Notes (Signed)
Pre-visit discussion using our clinic review tool. No additional management support is needed unless otherwise documented below in the visit note.  

## 2015-09-01 LAB — HIV ANTIBODY (ROUTINE TESTING W REFLEX): HIV 1&2 Ab, 4th Generation: NONREACTIVE

## 2015-09-02 DIAGNOSIS — R030 Elevated blood-pressure reading, without diagnosis of hypertension: Secondary | ICD-10-CM | POA: Insufficient documentation

## 2015-09-02 NOTE — Assessment & Plan Note (Addendum)
Annual Medicare wellness  exam was done as well as a comprehensive physical exam and management of acute and chronic conditions .  During the course of the visit the patient was educated and counseled about appropriate screening and preventive services including : fall prevention , diabetes screening, nutrition counseling, colorectal cancer screening, and recommended immunizations.  Printed recommendations for health maintenance screenings was given.   Lab Results  Component Value Date   CHOL 195 08/31/2015   HDL 66.00 08/31/2015   LDLCALC 115* 08/31/2015   LDLDIRECT 112.8 03/23/2013   TRIG 73.0 08/31/2015   CHOLHDL 3 08/31/2015

## 2015-09-02 NOTE — Addendum Note (Signed)
Addended by: Crecencio Mc on: 09/02/2015 05:39 PM   Modules accepted: Miquel Dunn

## 2015-09-02 NOTE — Assessment & Plan Note (Addendum)
sHe has no prior history of hypertension. sHe will check blood pressure several times over the next 3-4 weeks and to submit readings for evaluation. If elevated will start amlodipine 2.5 mg daily for persistent elevation.  She has normal renal function and normal lytes  Lab Results  Component Value Date   CREATININE 0.71 08/31/2015   Lab Results  Component Value Date   NA 140 08/31/2015   K 4.7 08/31/2015   CL 104 08/31/2015   CO2 28 08/31/2015

## 2015-09-05 ENCOUNTER — Encounter: Payer: Self-pay | Admitting: *Deleted

## 2015-11-16 DIAGNOSIS — J029 Acute pharyngitis, unspecified: Secondary | ICD-10-CM | POA: Diagnosis not present

## 2015-11-16 DIAGNOSIS — J069 Acute upper respiratory infection, unspecified: Secondary | ICD-10-CM | POA: Diagnosis not present

## 2016-02-28 DIAGNOSIS — M25551 Pain in right hip: Secondary | ICD-10-CM | POA: Diagnosis not present

## 2016-02-28 DIAGNOSIS — M1611 Unilateral primary osteoarthritis, right hip: Secondary | ICD-10-CM | POA: Diagnosis not present

## 2016-02-28 DIAGNOSIS — M7631 Iliotibial band syndrome, right leg: Secondary | ICD-10-CM | POA: Diagnosis not present

## 2016-07-31 ENCOUNTER — Encounter: Payer: Self-pay | Admitting: Internal Medicine

## 2016-07-31 DIAGNOSIS — Z1231 Encounter for screening mammogram for malignant neoplasm of breast: Secondary | ICD-10-CM | POA: Diagnosis not present

## 2016-08-26 DIAGNOSIS — Z23 Encounter for immunization: Secondary | ICD-10-CM | POA: Diagnosis not present

## 2016-09-01 ENCOUNTER — Encounter: Payer: Self-pay | Admitting: Internal Medicine

## 2016-09-01 ENCOUNTER — Ambulatory Visit (INDEPENDENT_AMBULATORY_CARE_PROVIDER_SITE_OTHER): Payer: Medicare Other | Admitting: Internal Medicine

## 2016-09-01 ENCOUNTER — Other Ambulatory Visit: Payer: Self-pay | Admitting: Internal Medicine

## 2016-09-01 ENCOUNTER — Ambulatory Visit (INDEPENDENT_AMBULATORY_CARE_PROVIDER_SITE_OTHER)
Admission: RE | Admit: 2016-09-01 | Discharge: 2016-09-01 | Disposition: A | Discharge status: Home / Self Care | Payer: Medicare Other | Source: Ambulatory Visit | Attending: Internal Medicine | Admitting: Internal Medicine

## 2016-09-01 VITALS — BP 128/60 | HR 73 | Temp 97.6°F | Ht 63.0 in | Wt 147.5 lb

## 2016-09-01 DIAGNOSIS — M79671 Pain in right foot: Secondary | ICD-10-CM

## 2016-09-01 DIAGNOSIS — Z0000 Encounter for general adult medical examination without abnormal findings: Secondary | ICD-10-CM | POA: Diagnosis not present

## 2016-09-01 DIAGNOSIS — E785 Hyperlipidemia, unspecified: Secondary | ICD-10-CM | POA: Diagnosis not present

## 2016-09-01 DIAGNOSIS — E559 Vitamin D deficiency, unspecified: Secondary | ICD-10-CM | POA: Diagnosis not present

## 2016-09-01 DIAGNOSIS — N952 Postmenopausal atrophic vaginitis: Secondary | ICD-10-CM | POA: Insufficient documentation

## 2016-09-01 DIAGNOSIS — Z780 Asymptomatic menopausal state: Secondary | ICD-10-CM | POA: Diagnosis not present

## 2016-09-01 DIAGNOSIS — M7731 Calcaneal spur, right foot: Secondary | ICD-10-CM | POA: Diagnosis not present

## 2016-09-01 DIAGNOSIS — M1711 Unilateral primary osteoarthritis, right knee: Secondary | ICD-10-CM

## 2016-09-01 DIAGNOSIS — N763 Subacute and chronic vulvitis: Secondary | ICD-10-CM

## 2016-09-01 DIAGNOSIS — R5383 Other fatigue: Secondary | ICD-10-CM

## 2016-09-01 MED ORDER — CLOBETASOL PROPIONATE 0.05 % EX OINT: 1.0000 "application " | TOPICAL_OINTMENT | Freq: Two times a day (BID) | CUTANEOUS | 0 refills | Status: DC

## 2016-09-01 MED ORDER — TRIAMCINOLONE ACETONIDE 0.5 % EX OINT: 1.0000 "application " | TOPICAL_OINTMENT | Freq: Two times a day (BID) | CUTANEOUS | 0 refills | Status: DC

## 2016-09-01 NOTE — Assessment & Plan Note (Signed)
Requesting refill of clobetasol, which was too expense.  Triamcinolone 0.5% substituted.

## 2016-09-01 NOTE — Progress Notes (Signed)
Patient ID: Shannon Hickman, female    DOB: 11-29-1946  Age: 70 y.o. MRN: [id number]  The patient is here for annual Medicare wellness examination and management of other chronic and acute problems.   The risk factors are reflected in the social history.  The roster of all physicians providing medical care to patient - is listed in the Snapshot section of the chart.  Activities of daily living:  The patient is 100% independent in all ADLs: dressing, toileting, feeding as well as independent mobility  Home safety : The patient has smoke detectors in the home. They wear seatbelts.  There are no firearms at home. There is no violence in the home.   There is no risks for hepatitis, STDs or HIV. There is no   history of blood transfusion. They have no travel history to infectious disease endemic areas of the world.  The patient has seen their dentist in the last six month. They have seen their eye doctor in the last year. They admit to slight hearing difficulty with regard to whispered voices and some television programs.  They have deferred audiologic testing in the last year.  They do not  have excessive sun exposure. Discussed the need for sun protection: hats, long sleeves and use of sunscreen if there is significant sun exposure.   Diet: the importance of a healthy diet is discussed. They do have a healthy diet.  The benefits of regular aerobic exercise were discussed. She walks 4 times per week ,  20 minutes.   Depression screen: there are no signs or vegative symptoms of depression- irritability, change in appetite, anhedonia, sadness/tearfullness.  Cognitive assessment: the patient manages all their financial and personal affairs and is actively engaged. They could relate day,date,year and events; recalled 2/3 objects at 3 minutes; performed clock-face test normally.  The following portions of the patient's history were reviewed and updated as appropriate: allergies, current medications, past  family history, past medical history,  past surgical history, past social history  and problem list.  Visual acuity was not assessed per patient preference since she has regular follow up with her ophthalmologist. Hearing and body mass index were assessed and reviewed.   During the course of the visit the patient was educated and counseled about appropriate screening and preventive services including : fall prevention , diabetes screening, nutrition counseling, colorectal cancer screening, and recommended immunizations.    CC: The primary encounter diagnosis was Vitamin D deficiency. Diagnoses of Pain of right heel, Hyperlipidemia, Other fatigue, Postmenopausal estrogen deficiency, Post-menopausal atrophic vaginitis, Chronic vulvitis, Encounter for Medicare annual wellness exam, and Primary osteoarthritis of right knee were also pertinent to this visit.  Still having left knee pain despite meniscus repair last August.     Recurrent episodes of Right  heel pain occurring briefly in the morning;  Gone after 30 minutes of being on feet. No history of trauma.   Recurrent vaginal itch .  No discharge.    Dr Dolores Hoose has prescribed clobetasol in the past for chronic vulvitis. 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   She has a past surgical history that includes HISTOPLASMOSIS; Appendectomy (1998); Partial colectomy; and Knee arthroscopy (Left, 08/02/2015).   Her family history includes Diabetes (age of onset: 57) in her father; Heart disease (age of onset: 56) in her father; Stomach cancer in her maternal  grandfather.She reports that she has never smoked. She does not have any smokeless tobacco history on file. She reports that she does not drink alcohol. Her drug history is not on file.  Outpatient Medications Prior to Visit   Medication Sig Dispense Refill  . acetaminophen (TYLENOL) 325 MG tablet Take 650 mg by mouth every 6 (six) hours as needed.    . calcium citrate (CALCITRATE - DOSED IN MG ELEMENTAL CALCIUM) 950 MG tablet Take 2 tablets by mouth daily.    . cetirizine (ZYRTEC) 10 MG tablet Take 10 mg by mouth daily.     . Cholecalciferol (VITAMIN D3) 2000 UNITS TABS Take 1 tablet by mouth daily.    . fish oil-omega-3 fatty acids 1000 MG capsule Take 2 g by mouth daily.    . multivitamin-lutein (OCUVITE-LUTEIN) CAPS Take 1 capsule by mouth daily.    . naproxen sodium (ANAPROX) 220 MG tablet Take 220 mg by mouth 2 (two) times daily with a meal.     . vitamin E 400 UNIT capsule Take 400 Units by mouth daily.    Marland Kitchen HYDROcodone-acetaminophen (NORCO) 5-325 MG per tablet Take 1-2 tablets by mouth every 6 (six) hours as needed for moderate pain. 30 tablet 0   No facility-administered medications prior to visit.     Review of Systems  Patient denies headache, fevers, malaise, unintentional weight loss, skin rash, eye pain, sinus congestion and sinus pain, sore throat, dysphagia,  hemoptysis , cough, dyspnea, wheezing, chest pain, palpitations, orthopnea, edema, abdominal pain, nausea, melena, diarrhea, constipation, flank pain, dysuria, hematuria, urinary  Frequency, nocturia, numbness, tingling, seizures,  Focal weakness, Loss of consciousness,  Tremor, insomnia, depression, anxiety, and suicidal ideation.     Objective:  BP 128/60   Pulse 73   Temp 97.6 F (36.4 C) (Oral)   Ht 5\' 3"  (1.6 m)   Wt 147 lb 8 oz (66.9 kg)   SpO2 97%   BMI 26.13 kg/m   Physical Exam   General appearance: alert, cooperative and appears stated age Head: Normocephalic, without obvious abnormality, atraumatic Eyes: conjunctivae/corneas clear. PERRL, EOM's intact. Fundi benign. Ears: normal TM's and external ear canals both ears Nose: Nares normal. Septum midline. Mucosa normal. No drainage or sinus tenderness. Throat: lips,  mucosa, and tongue normal; teeth and gums normal Neck: no adenopathy, no carotid bruit, no JVD, supple, symmetrical, trachea midline and thyroid not enlarged, symmetric, no tenderness/mass/nodules Lungs: clear to auscultation bilaterally Breasts: normal appearance, no masses or tenderness Heart: regular rate and rhythm, S1, S2 normal, no murmur, click, rub or gallop Abdomen: soft, non-tender; bowel sounds normal; no masses,  no organomegaly Extremities: extremities normal, atraumatic, no cyanosis or edema Pulses: 2+ and symmetric Skin: Skin color, texture, turgor normal. No rashes or lesions Neurologic: Alert and oriented X 3, normal strength and tone. Normal symmetric reflexes. Normal coordination and gait.     Assessment & Plan:   Problem List Items Addressed This Visit    Encounter for Medicare annual wellness exam    Annual Medicare wellness  exam was done as well as a comprehensive physical exam and management of acute and chronic conditions .  During the course of the visit the patient was educated and counseled about appropriate screening and preventive services including : fall prevention , diabetes screening, nutrition counseling, colorectal cancer screening, and recommended immunizations.  Printed recommendations for health maintenance screenings was given.       Pain of right heel    Plain films confirm bone spur,  Gel insoles recommended and NSAIDs      Relevant Orders   DG Foot 2 Views Right (Completed)   Hyperlipidemia    LDL and triglycerides are at goal without  medications. No changes today   Lab Results  Component Value Date   CHOL 195 08/31/2015   HDL 66.00 08/31/2015   LDLCALC 115 (H) 08/31/2015   LDLDIRECT 112.8 03/23/2013   TRIG 73.0 08/31/2015   CHOLHDL 3 08/31/2015         Relevant Orders   Lipid panel   OA (osteoarthritis) of knee    Post operative, recommended trial of glucosamine/othe natural anti inflammatories, and quad strengthening exercises        Chronic vulvitis    Requesting refill of clobetasol, which was too expense.  Triamcinolone 0.5% substituted.       RESOLVED: Post-menopausal atrophic vaginitis   Postmenopausal estrogen deficiency   Relevant Orders   DG Bone Density    Other Visit Diagnoses    Vitamin D deficiency    -  Primary   Relevant Orders   VITAMIN D 25 Hydroxy (Vit-D Deficiency, Fractures)   Other fatigue       Relevant Orders   Comprehensive metabolic panel   TSH   CBC with Differential/Platelet      I have discontinued Ms. Veals's HYDROcodone-acetaminophen. I am also having her maintain her naproxen sodium, cetirizine, calcium citrate, multivitamin-lutein, fish oil-omega-3 fatty acids, acetaminophen, Vitamin D3, and vitamin E.  Meds ordered this encounter  Medications  . DISCONTD: clobetasol ointment (TEMOVATE) 0.05 %    Sig: Apply 1 application topically 2 (two) times daily.    Dispense:  30 g    Refill:  0    Medications Discontinued During This Encounter  Medication Reason  . HYDROcodone-acetaminophen (NORCO) 5-325 MG per tablet Patient Preference    Follow-up: Return in about 3 days (around 09/04/2016), or fasting labs asap.   Crecencio Mc, MD

## 2016-09-01 NOTE — Progress Notes (Signed)
Pre visit review using our clinic review tool, if applicable. No additional management support is needed unless otherwise documented below in the visit note. 

## 2016-09-01 NOTE — Patient Instructions (Addendum)
Natural remedies for arthritis pain include:  Glucosamine /chrondroitin sulfate ( "osteo Biflex is a name brand version)   Turmeric tablets twice daily   Full knee extensons while sitting    30 minutes of continuous movement daly for exercise.   We will order bone density   Menopause is a normal process in which your reproductive ability comes to an end. This process happens gradually over a span of months to years, usually between the ages of 68 and 44. Menopause is complete when you have missed 12 consecutive menstrual periods. It is important to talk with your health care provider about some of the most common conditions that affect postmenopausal women, such as heart disease, cancer, and bone loss (osteoporosis). Adopting a healthy lifestyle and getting preventive care can help to promote your health and wellness. Those actions can also lower your chances of developing some of these common conditions. WHAT SHOULD I KNOW ABOUT MENOPAUSE? During menopause, you may experience a number of symptoms, such as:  Moderate-to-severe hot flashes.  Night sweats.  Decrease in sex drive.  Mood swings.  Headaches.  Tiredness.  Irritability.  Memory problems.  Insomnia. Choosing to treat or not to treat menopausal changes is an individual decision that you make with your health care provider. WHAT SHOULD I KNOW ABOUT HORMONE REPLACEMENT THERAPY AND SUPPLEMENTS? Hormone therapy products are effective for treating symptoms that are associated with menopause, such as hot flashes and night sweats. Hormone replacement carries certain risks, especially as you become older. If you are thinking about using estrogen or estrogen with progestin treatments, discuss the benefits and risks with your health care provider. WHAT SHOULD I KNOW ABOUT HEART DISEASE AND STROKE? Heart disease, heart attack, and stroke become more likely as you age. This may be due, in part, to the hormonal changes that your body  experiences during menopause. These can affect how your body processes dietary fats, triglycerides, and cholesterol. Heart attack and stroke are both medical emergencies. There are many things that you can do to help prevent heart disease and stroke:  Have your blood pressure checked at least every 1-2 years. High blood pressure causes heart disease and increases the risk of stroke.  If you are 25-63 years old, ask your health care provider if you should take aspirin to prevent a heart attack or a stroke.  Do not use any tobacco products, including cigarettes, chewing tobacco, or electronic cigarettes. If you need help quitting, ask your health care provider.  It is important to eat a healthy diet and maintain a healthy weight.  Be sure to include plenty of vegetables, fruits, low-fat dairy products, and lean protein.  Avoid eating foods that are high in solid fats, added sugars, or salt (sodium).  Get regular exercise. This is one of the most important things that you can do for your health.  Try to exercise for at least 150 minutes each week. The type of exercise that you do should increase your heart rate and make you sweat. This is known as moderate-intensity exercise.  Try to do strengthening exercises at least twice each week. Do these in addition to the moderate-intensity exercise.  Know your numbers.Ask your health care provider to check your cholesterol and your blood glucose. Continue to have your blood tested as directed by your health care provider. WHAT SHOULD I KNOW ABOUT CANCER SCREENING? There are several types of cancer. Take the following steps to reduce your risk and to catch any cancer development as early as  possible. Breast Cancer  Practice breast self-awareness.  This means understanding how your breasts normally appear and feel.  It also means doing regular breast self-exams. Let your health care provider know about any changes, no matter how small.  If you  are 40 or older, have a clinician do a breast exam (clinical breast exam or CBE) every year. Depending on your age, family history, and medical history, it may be recommended that you also have a yearly breast X-ray (mammogram).  If you have a family history of breast cancer, talk with your health care provider about genetic screening.  If you are at high risk for breast cancer, talk with your health care provider about having an MRI and a mammogram every year.  Breast cancer (BRCA) gene test is recommended for women who have family members with BRCA-related cancers. Results of the assessment will determine the need for genetic counseling and BRCA1 and for BRCA2 testing. BRCA-related cancers include these types:  Breast. This occurs in males or females.  Ovarian.  Tubal. This may also be called fallopian tube cancer.  Cancer of the abdominal or pelvic lining (peritoneal cancer).  Prostate.  Pancreatic. Cervical, Uterine, and Ovarian Cancer Your health care provider may recommend that you be screened regularly for cancer of the pelvic organs. These include your ovaries, uterus, and vagina. This screening involves a pelvic exam, which includes checking for microscopic changes to the surface of your cervix (Pap test).  For women ages 21-65, health care providers may recommend a pelvic exam and a Pap test every three years. For women ages 53-65, they may recommend the Pap test and pelvic exam, combined with testing for human papilloma virus (HPV), every five years. Some types of HPV increase your risk of cervical cancer. Testing for HPV may also be done on women of any age who have unclear Pap test results.  Other health care providers may not recommend any screening for nonpregnant women who are considered low risk for pelvic cancer and have no symptoms. Ask your health care provider if a screening pelvic exam is right for you.  If you have had past treatment for cervical cancer or a condition  that could lead to cancer, you need Pap tests and screening for cancer for at least 20 years after your treatment. If Pap tests have been discontinued for you, your risk factors (such as having a new sexual partner) need to be reassessed to determine if you should start having screenings again. Some women have medical problems that increase the chance of getting cervical cancer. In these cases, your health care provider may recommend that you have screening and Pap tests more often.  If you have a family history of uterine cancer or ovarian cancer, talk with your health care provider about genetic screening.  If you have vaginal bleeding after reaching menopause, tell your health care provider.  There are currently no reliable tests available to screen for ovarian cancer. Lung Cancer Lung cancer screening is recommended for adults 16-49 years old who are at high risk for lung cancer because of a history of smoking. A yearly low-dose CT scan of the lungs is recommended if you:  Currently smoke.  Have a history of at least 30 pack-years of smoking and you currently smoke or have quit within the past 15 years. A pack-year is smoking an average of one pack of cigarettes per day for one year. Yearly screening should:  Continue until it has been 15 years since you quit.  Stop if you develop a health problem that would prevent you from having lung cancer treatment. Colorectal Cancer  This type of cancer can be detected and can often be prevented.  Routine colorectal cancer screening usually begins at age 18 and continues through age 58.  If you have risk factors for colon cancer, your health care provider may recommend that you be screened at an earlier age.  If you have a family history of colorectal cancer, talk with your health care provider about genetic screening.  Your health care provider may also recommend using home test kits to check for hidden blood in your stool.  A small camera at  the end of a tube can be used to examine your colon directly (sigmoidoscopy or colonoscopy). This is done to check for the earliest forms of colorectal cancer.  Direct examination of the colon should be repeated every 5-10 years until age 28. However, if early forms of precancerous polyps or small growths are found or if you have a family history or genetic risk for colorectal cancer, you may need to be screened more often. Skin Cancer  Check your skin from head to toe regularly.  Monitor any moles. Be sure to tell your health care provider:  About any new moles or changes in moles, especially if there is a change in a mole's shape or color.  If you have a mole that is larger than the size of a pencil eraser.  If any of your family members has a history of skin cancer, especially at a young age, talk with your health care provider about genetic screening.  Always use sunscreen. Apply sunscreen liberally and repeatedly throughout the day.  Whenever you are outside, protect yourself by wearing long sleeves, pants, a wide-brimmed hat, and sunglasses. WHAT SHOULD I KNOW ABOUT OSTEOPOROSIS? Osteoporosis is a condition in which bone destruction happens more quickly than new bone creation. After menopause, you may be at an increased risk for osteoporosis. To help prevent osteoporosis or the bone fractures that can happen because of osteoporosis, the following is recommended:  If you are 3-43 years old, get at least 1,000 mg of calcium and at least 600 mg of vitamin D per day.  If you are older than age 1 but younger than age 37, get at least 1,200 mg of calcium and at least 600 mg of vitamin D per day.  If you are older than age 37, get at least 1,200 mg of calcium and at least 800 mg of vitamin D per day. Smoking and excessive alcohol intake increase the risk of osteoporosis. Eat foods that are rich in calcium and vitamin D, and do weight-bearing exercises several times each week as directed by  your health care provider. WHAT SHOULD I KNOW ABOUT HOW MENOPAUSE AFFECTS [place]? Depression may occur at any age, but it is more common as you become older. Common symptoms of depression include:  Low or sad mood.  Changes in sleep patterns.  Changes in appetite or eating patterns.  Feeling an overall lack of motivation or enjoyment of activities that you previously enjoyed.  Frequent crying spells. Talk with your health care provider if you think that you are experiencing depression. WHAT SHOULD I KNOW ABOUT IMMUNIZATIONS? It is important that you get and maintain your immunizations. These include:  Tetanus, diphtheria, and pertussis (Tdap) booster vaccine.  Influenza every year before the flu season begins.  Pneumonia vaccine.  Shingles vaccine. Your health care provider may also recommend  other immunizations.   This information is not intended to replace advice given to you by your health care provider. Make sure you discuss any questions you have with your health care provider.   Document Released: 01/23/2006 Document Revised: 12/22/2014 Document Reviewed: 08/03/2014 Elsevier Interactive Patient Education [employer].

## 2016-09-02 DIAGNOSIS — M179 Osteoarthritis of knee, unspecified: Secondary | ICD-10-CM | POA: Insufficient documentation

## 2016-09-02 DIAGNOSIS — J029 Acute pharyngitis, unspecified: Secondary | ICD-10-CM | POA: Diagnosis not present

## 2016-09-02 DIAGNOSIS — R05 Cough: Secondary | ICD-10-CM | POA: Diagnosis not present

## 2016-09-02 DIAGNOSIS — M1710 Unilateral primary osteoarthritis, unspecified knee: Secondary | ICD-10-CM | POA: Insufficient documentation

## 2016-09-02 NOTE — Assessment & Plan Note (Signed)

## 2016-09-02 NOTE — Assessment & Plan Note (Signed)
LDL and triglycerides are at goal without  medications. No changes today   Lab Results  Component Value Date   CHOL 195 08/31/2015   HDL 66.00 08/31/2015   LDLCALC 115 (H) 08/31/2015   LDLDIRECT 112.8 03/23/2013   TRIG 73.0 08/31/2015   CHOLHDL 3 08/31/2015

## 2016-09-02 NOTE — Assessment & Plan Note (Signed)
Plain films confirm bone spur,  Gel insoles recommended and NSAIDs

## 2016-09-02 NOTE — Assessment & Plan Note (Signed)
Post operative, recommended trial of glucosamine/othe natural anti inflammatories, and quad strengthening exercises

## 2016-09-03 ENCOUNTER — Other Ambulatory Visit: Payer: Medicare Other

## 2016-09-16 ENCOUNTER — Other Ambulatory Visit (INDEPENDENT_AMBULATORY_CARE_PROVIDER_SITE_OTHER): Payer: Medicare Other

## 2016-09-16 DIAGNOSIS — R5383 Other fatigue: Secondary | ICD-10-CM

## 2016-09-16 DIAGNOSIS — E785 Hyperlipidemia, unspecified: Secondary | ICD-10-CM

## 2016-09-16 DIAGNOSIS — E559 Vitamin D deficiency, unspecified: Secondary | ICD-10-CM | POA: Diagnosis not present

## 2016-09-16 LAB — COMPREHENSIVE METABOLIC PANEL
ALK PHOS: 41 U/L (ref 39–117)
ALT: 15 U/L (ref 0–35)
AST: 18 U/L (ref 0–37)
Albumin: 4.1 g/dL (ref 3.5–5.2)
BUN: 18 mg/dL (ref 6–23)
CO2: 32 mEq/L (ref 19–32)
Calcium: 9.3 mg/dL (ref 8.4–10.5)
Chloride: 105 mEq/L (ref 96–112)
Creatinine, Ser: 0.78 mg/dL (ref 0.40–1.20)
GFR: 77.82 mL/min (ref >60.00)
GLUCOSE: 94 mg/dL (ref 70–99)
POTASSIUM: 4.8 meq/L (ref 3.5–5.1)
Sodium: 142 mEq/L (ref 135–145)
TOTAL PROTEIN: 7.3 g/dL (ref 6.0–8.3)
Total Bilirubin: 0.5 mg/dL (ref 0.2–1.2)

## 2016-09-16 LAB — CBC WITH DIFFERENTIAL/PLATELET
BASOS PCT: 0.6 % (ref 0.0–3.0)
Basophils Absolute: 0 10*3/uL (ref 0.0–0.1)
EOS PCT: 1.5 % (ref 0.0–5.0)
Eosinophils Absolute: 0.1 10*3/uL (ref 0.0–0.7)
HEMATOCRIT: 42.8 % (ref 36.0–46.0)
Hemoglobin: 14.4 g/dL (ref 12.0–15.0)
LYMPHS PCT: 28.9 % (ref 12.0–46.0)
Lymphs Abs: 1.1 10*3/uL (ref 0.7–4.0)
MCHC: 33.7 g/dL (ref 30.0–36.0)
MCV: 95 fl (ref 78.0–100.0)
MONOS PCT: 9.2 % (ref 3.0–12.0)
Monocytes Absolute: 0.4 10*3/uL (ref 0.1–1.0)
NEUTROS ABS: 2.4 10*3/uL (ref 1.4–7.7)
Neutrophils Relative %: 59.8 % (ref 43.0–77.0)
PLATELETS: 232 10*3/uL (ref 150.0–400.0)
RBC: 4.5 Mil/uL (ref 3.87–5.11)
RDW: 13.4 % (ref 11.5–15.5)
WBC: 4 10*3/uL (ref 4.0–10.5)

## 2016-09-16 LAB — TSH: TSH: 0.78 u[IU]/mL (ref 0.35–4.50)

## 2016-09-16 LAB — LIPID PANEL
CHOL/HDL RATIO: 4
CHOLESTEROL: 255 mg/dL — AB (ref 0–200)
HDL: 66.2 mg/dL (ref >39.00)
LDL Cholesterol: 172 mg/dL — ABNORMAL HIGH (ref 0–99)
NonHDL: 188.5
TRIGLYCERIDES: 81 mg/dL (ref 0.0–149.0)
VLDL: 16.2 mg/dL (ref 0.0–40.0)

## 2016-09-16 LAB — VITAMIN D 25 HYDROXY (VIT D DEFICIENCY, FRACTURES): VITD: 37 ng/mL (ref 30.00–100.00)

## 2016-09-19 ENCOUNTER — Encounter: Payer: Self-pay | Admitting: Internal Medicine

## 2016-09-23 ENCOUNTER — Other Ambulatory Visit: Payer: Self-pay | Admitting: Internal Medicine

## 2016-09-23 DIAGNOSIS — Z79899 Other long term (current) drug therapy: Secondary | ICD-10-CM

## 2016-09-23 MED ORDER — PRAVASTATIN SODIUM 20 MG PO TABS
20.0000 mg | ORAL_TABLET | Freq: Every day | ORAL | 0 refills | Status: DC
Start: 2016-09-23 — End: 2016-12-12

## 2016-10-09 ENCOUNTER — Encounter: Payer: Self-pay | Admitting: Internal Medicine

## 2016-10-16 ENCOUNTER — Other Ambulatory Visit (INDEPENDENT_AMBULATORY_CARE_PROVIDER_SITE_OTHER): Payer: Medicare Other

## 2016-10-16 DIAGNOSIS — Z79899 Other long term (current) drug therapy: Secondary | ICD-10-CM

## 2016-10-16 LAB — HEPATIC FUNCTION PANEL
ALBUMIN: 4.2 g/dL (ref 3.5–5.2)
ALK PHOS: 49 U/L (ref 39–117)
ALT: 14 U/L (ref 0–35)
AST: 17 U/L (ref 0–37)
Bilirubin, Direct: 0.1 mg/dL (ref 0.0–0.3)
TOTAL PROTEIN: 6.8 g/dL (ref 6.0–8.3)
Total Bilirubin: 0.4 mg/dL (ref 0.2–1.2)

## 2016-10-19 ENCOUNTER — Encounter: Payer: Self-pay | Admitting: Internal Medicine

## 2016-10-22 ENCOUNTER — Ambulatory Visit
Admission: RE | Admit: 2016-10-22 | Discharge: 2016-10-22 | Disposition: A | Discharge status: Home / Self Care | Payer: Medicare Other | Source: Ambulatory Visit | Attending: Internal Medicine | Admitting: Internal Medicine

## 2016-10-22 DIAGNOSIS — M85851 Other specified disorders of bone density and structure, right thigh: Secondary | ICD-10-CM | POA: Insufficient documentation

## 2016-10-22 DIAGNOSIS — Z780 Asymptomatic menopausal state: Secondary | ICD-10-CM | POA: Diagnosis not present

## 2016-10-22 DIAGNOSIS — M8588 Other specified disorders of bone density and structure, other site: Secondary | ICD-10-CM | POA: Insufficient documentation

## 2016-10-23 ENCOUNTER — Encounter: Payer: Self-pay | Admitting: Internal Medicine

## 2016-10-23 DIAGNOSIS — M85851 Other specified disorders of bone density and structure, right thigh: Secondary | ICD-10-CM | POA: Diagnosis not present

## 2016-11-26 DIAGNOSIS — Q6621 Congenital metatarsus primus varus: Secondary | ICD-10-CM | POA: Diagnosis not present

## 2016-11-26 DIAGNOSIS — M2012 Hallux valgus (acquired), left foot: Secondary | ICD-10-CM | POA: Diagnosis not present

## 2016-11-26 DIAGNOSIS — M79673 Pain in unspecified foot: Secondary | ICD-10-CM | POA: Diagnosis not present

## 2016-12-12 ENCOUNTER — Encounter: Payer: Self-pay | Admitting: Internal Medicine

## 2016-12-12 DIAGNOSIS — E785 Hyperlipidemia, unspecified: Secondary | ICD-10-CM

## 2016-12-12 DIAGNOSIS — Z79899 Other long term (current) drug therapy: Secondary | ICD-10-CM

## 2016-12-12 MED ORDER — PRAVASTATIN SODIUM 20 MG PO TABS: 20.0000 mg | ORAL_TABLET | Freq: Every day | ORAL | 0 refills | Status: DC

## 2017-01-02 DIAGNOSIS — M2012 Hallux valgus (acquired), left foot: Secondary | ICD-10-CM | POA: Diagnosis not present

## 2017-01-02 NOTE — Discharge Instructions (Signed)
[facility] °[facility] ° °POST OPERATIVE INSTRUCTIONS FOR DR. TROXLER AND DR. FOWLER °[facility] ° ° °1. Take your medication as prescribed.  Pain medication should be taken only as needed. ° °2. Keep the dressing clean, dry and intact. ° °3. Keep your foot elevated above the heart level for the first 48 hours. ° °4. Walking to the bathroom and brief periods of walking are acceptable, unless we have instructed you to be non-weight bearing. ° °5. Always wear your post-op shoe when walking.  Always use your crutches if you are to be non-weight bearing. ° °6. Do not take a shower. Baths are permissible as long as the foot is kept out of the water.  ° °7. Every hour you are awake:  °- Bend your knee 15 times. °- Flex foot 15 times °- Massage calf 15 times ° °8. Call [facility] ([phone]) if any of the following problems occur: °- You develop a temperature or fever. °- The bandage becomes saturated with blood. °- Medication does not stop your pain. °- Injury of the foot occurs. °- Any symptoms of infection including redness, odor, or red streaks running from wound. ° ° °General Anesthesia, Adult, Care After °These instructions provide you with information about caring for yourself after your procedure. Your health care provider may also give you more specific instructions. Your treatment has been planned according to current medical practices, but problems sometimes occur. Call your health care provider if you have any problems or questions after your procedure. °What can I expect after the procedure? °After the procedure, it is common to have: °· Vomiting. °· A sore throat. °· Mental slowness. °It is common to feel: °· Nauseous. °· Cold or shivery. °· Sleepy. °· Tired. °· Sore or achy, even in parts of your body where you did not have surgery. °Follow these instructions at home: °For at least 24 hours after the procedure: °· Do not: °¨ Participate in  activities where you could fall or become injured. °¨ Drive. °¨ Use heavy machinery. °¨ Drink alcohol. °¨ Take sleeping pills or medicines that cause drowsiness. °¨ Make important decisions or sign legal documents. °¨ Take care of children on your own. °· Rest. °Eating and drinking °· If you vomit, drink water, juice, or soup when you can drink without vomiting. °· Drink enough fluid to keep your urine clear or pale yellow. °· Make sure you have little or no nausea before eating solid foods. °· Follow the diet recommended by your health care provider. °General instructions °· Have a responsible adult stay with you until you are awake and alert. °· Return to your normal activities as told by your health care provider. Ask your health care provider what activities are safe for you. °· Take over-the-counter and prescription medicines only as told by your health care provider. °· If you smoke, do not smoke without supervision. °· Keep all follow-up visits as told by your health care provider. This is important. °Contact a health care provider if: °· You continue to have nausea or vomiting at home, and medicines are not helpful. °· You cannot drink fluids or start eating again. °· You cannot urinate after 8-12 hours. °· You develop a skin rash. °· You have fever. °· You have increasing redness at the site of your procedure. °Get help right away if: °· You have difficulty breathing. °· You have chest pain. °· You have unexpected bleeding. °· You feel that you are having   a life-threatening or urgent problem. °This information is not intended to replace advice given to you by your health care provider. Make sure you discuss any questions you have with your health care provider. °Document Released: 03/09/2001 Document Revised: 05/05/2016 Document Reviewed: 11/15/2015 °Elsevier Interactive Patient Education © 2017 [employer] ° °

## 2017-01-08 ENCOUNTER — Encounter
Admission: RE | Disposition: A | Discharge status: Home / Self Care | Payer: Self-pay | Source: Ambulatory Visit | Attending: Podiatry

## 2017-01-08 ENCOUNTER — Ambulatory Visit
Admission: RE | Admit: 2017-01-08 | Discharge: 2017-01-08 | Disposition: A | Discharge status: Home / Self Care | Payer: Medicare Other | Source: Ambulatory Visit | Attending: Podiatry | Admitting: Podiatry

## 2017-01-08 ENCOUNTER — Ambulatory Visit: Payer: Medicare Other | Admitting: Anesthesiology

## 2017-01-08 DIAGNOSIS — Z7951 Long term (current) use of inhaled steroids: Secondary | ICD-10-CM | POA: Diagnosis not present

## 2017-01-08 DIAGNOSIS — M1990 Unspecified osteoarthritis, unspecified site: Secondary | ICD-10-CM | POA: Insufficient documentation

## 2017-01-08 DIAGNOSIS — Z9889 Other specified postprocedural states: Secondary | ICD-10-CM | POA: Insufficient documentation

## 2017-01-08 DIAGNOSIS — G8929 Other chronic pain: Secondary | ICD-10-CM | POA: Diagnosis not present

## 2017-01-08 DIAGNOSIS — M2012 Hallux valgus (acquired), left foot: Secondary | ICD-10-CM | POA: Diagnosis not present

## 2017-01-08 DIAGNOSIS — Z79899 Other long term (current) drug therapy: Secondary | ICD-10-CM | POA: Diagnosis not present

## 2017-01-08 DIAGNOSIS — Z791 Long term (current) use of non-steroidal anti-inflammatories (NSAID): Secondary | ICD-10-CM | POA: Diagnosis not present

## 2017-01-08 DIAGNOSIS — Z881 Allergy status to other antibiotic agents status: Secondary | ICD-10-CM | POA: Insufficient documentation

## 2017-01-08 DIAGNOSIS — Z8619 Personal history of other infectious and parasitic diseases: Secondary | ICD-10-CM | POA: Insufficient documentation

## 2017-01-08 HISTORY — PX: BUNIONECTOMY: SHX129

## 2017-01-08 SURGERY — BUNIONECTOMY
Anesthesia: General | Site: Foot | Laterality: Left | Wound class: Clean

## 2017-01-08 MED ORDER — FENTANYL CITRATE (PF) 100 MCG/2ML IJ SOLN
INTRAMUSCULAR | Status: DC | PRN
  Administered 2017-01-08 (×2): 50 ug via INTRAVENOUS

## 2017-01-08 MED ORDER — HYDROMORPHONE HCL 1 MG/ML IJ SOLN: 0.2500 mg | INTRAMUSCULAR | Status: DC | PRN

## 2017-01-08 MED ORDER — OXYCODONE HCL 5 MG/5ML PO SOLN: 5.0000 mg | Freq: Once | ORAL | Status: DC | PRN

## 2017-01-08 MED ORDER — LACTATED RINGERS IV SOLN
INTRAVENOUS | Status: DC
  Administered 2017-01-08: 10:00:00 via INTRAVENOUS

## 2017-01-08 MED ORDER — PROMETHAZINE HCL 25 MG/ML IJ SOLN: 6.2500 mg | INTRAMUSCULAR | Status: DC | PRN

## 2017-01-08 MED ORDER — SODIUM CHLORIDE 0.9 % IV SOLN
INTRAVENOUS | Status: DC | PRN
  Administered 2017-01-08: 9 mL

## 2017-01-08 MED ORDER — CEFAZOLIN SODIUM-DEXTROSE 2-4 GM/100ML-% IV SOLN
2.0000 g | Freq: Once | INTRAVENOUS | Status: AC
  Administered 2017-01-08: 2 g via INTRAVENOUS

## 2017-01-08 MED ORDER — PROPOFOL 10 MG/ML IV BOLUS
INTRAVENOUS | Status: DC | PRN
  Administered 2017-01-08: 200 mg via INTRAVENOUS

## 2017-01-08 MED ORDER — BUPIVACAINE HCL (PF) 0.25 % IJ SOLN
INTRAMUSCULAR | Status: DC | PRN
  Administered 2017-01-08: 8 mL

## 2017-01-08 MED ORDER — LIDOCAINE HCL (CARDIAC) 20 MG/ML IV SOLN
INTRAVENOUS | Status: DC | PRN
  Administered 2017-01-08: 50 mg via INTRATRACHEAL

## 2017-01-08 MED ORDER — DEXAMETHASONE SODIUM PHOSPHATE 4 MG/ML IJ SOLN
INTRAMUSCULAR | Status: DC | PRN
  Administered 2017-01-08: 8 mg via INTRAVENOUS

## 2017-01-08 MED ORDER — MIDAZOLAM HCL 5 MG/5ML IJ SOLN
INTRAMUSCULAR | Status: DC | PRN
  Administered 2017-01-08: 2 mg via INTRAVENOUS

## 2017-01-08 MED ORDER — ONDANSETRON HCL 4 MG/2ML IJ SOLN
INTRAMUSCULAR | Status: DC | PRN
  Administered 2017-01-08: 4 mg via INTRAVENOUS

## 2017-01-08 MED ORDER — OXYCODONE HCL 5 MG PO TABS: 5.0000 mg | ORAL_TABLET | Freq: Once | ORAL | Status: DC | PRN

## 2017-01-08 SURGICAL SUPPLY — 76 items
4 hole compression plate (Plate) ×3 IMPLANT
4.0 x 24st screw (Screw) IMPLANT
BANDAGE ELASTIC 4 VELCRO NS (GAUZE/BANDAGES/DRESSINGS) ×3 IMPLANT
BENZOIN TINCTURE PRP APPL 2/3 (GAUZE/BANDAGES/DRESSINGS) ×3 IMPLANT
BIT DRILL 2 FENESTRATED (MISCELLANEOUS) ×1 IMPLANT
BIT DRILL CANNULTD 2.6 X 130MM (MISCELLANEOUS) ×1 IMPLANT
BIT DRILL SOLID 2.0 X 110MM (DRILL) ×1 IMPLANT
BIT DRILLL 2 FENESTRATED (MISCELLANEOUS) ×2
BLADE MINI RND TIP GREEN BEAV (BLADE) IMPLANT
BLADE OSC/SAGITTAL MD 5.5X18 (BLADE) IMPLANT
BLADE OSC/SAGITTAL MD 9X18.5 (BLADE) ×3 IMPLANT
BNDG ESMARK 4X12 TAN STRL LF (GAUZE/BANDAGES/DRESSINGS) ×3 IMPLANT
BNDG GAUZE 4.5X4.1 6PLY STRL (MISCELLANEOUS) ×3 IMPLANT
BNDG STRETCH 4X75 STRL LF (GAUZE/BANDAGES/DRESSINGS) ×3 IMPLANT
CANISTER SUCT 1200ML W/VALVE (MISCELLANEOUS) ×3 IMPLANT
CAST PADDING 3X4FT ST 30246 (SOFTGOODS) ×4
CLOSURE WOUND 1/4X4 (GAUZE/BANDAGES/DRESSINGS) ×1
CONE REAMER 19MM (MISCELLANEOUS) ×2 IMPLANT
COUNTERSICK 4.0 HEADED (MISCELLANEOUS) ×3
COVER LIGHT HANDLE UNIVERSAL (MISCELLANEOUS) ×6 IMPLANT
COVER PIN YLW 0.028-062 (MISCELLANEOUS) IMPLANT
CUFF TOURN SGL QUICK 18 (TOURNIQUET CUFF) ×3 IMPLANT
DRAPE FLUOR MINI C-ARM 54X84 (DRAPES) ×3 IMPLANT
DRILL CANNULATED 2.6 X 130MM (MISCELLANEOUS) ×3
DRILL SOLID 2.0 X 110MM (DRILL) ×3
DURAPREP 26ML APPLICATOR (WOUND CARE) ×3 IMPLANT
GAUZE PETRO XEROFOAM 1X8 (MISCELLANEOUS) ×3 IMPLANT
GAUZE SPONGE 4X4 12PLY STRL (GAUZE/BANDAGES/DRESSINGS) ×3 IMPLANT
GLOVE BIO SURGEON STRL SZ8 (GLOVE) ×3 IMPLANT
GOWN STRL REUS W/ TWL LRG LVL3 (GOWN DISPOSABLE) ×1 IMPLANT
GOWN STRL REUS W/ TWL XL LVL3 (GOWN DISPOSABLE) ×1 IMPLANT
GOWN STRL REUS W/TWL LRG LVL3 (GOWN DISPOSABLE) ×2
GOWN STRL REUS W/TWL XL LVL3 (GOWN DISPOSABLE) ×2
K-WIRE DBL END TROCAR 6X.045 (WIRE)
K-WIRE DBL END TROCAR 6X.062 (WIRE)
K-WIRE SMOOTH 1.6X150MM (WIRE) ×3
K-WIRE SNGL END 1.2X150 (MISCELLANEOUS) ×6
KIT ROOM TURNOVER OR (KITS) ×3 IMPLANT
KWIRE DBL END TROCAR 6X.045 (WIRE) IMPLANT
KWIRE DBL END TROCAR 6X.062 (WIRE) IMPLANT
KWIRE SMOOTH 1.6X150MM (WIRE) ×1 IMPLANT
KWIRE SNGL END 1.2X150 (MISCELLANEOUS) ×2 IMPLANT
NEEDLE HYPO 18GX1.5 BLUNT FILL (NEEDLE) ×3 IMPLANT
NEEDLE HYPO 25GX1X1/2 BEV (NEEDLE) ×3 IMPLANT
NS IRRIG 500ML POUR BTL (IV SOLUTION) ×3 IMPLANT
PACK EXTREMITY ARMC (MISCELLANEOUS) ×3 IMPLANT
PAD CAST CTTN 3X4 STRL (SOFTGOODS) ×2 IMPLANT
PAD GROUND ADULT SPLIT (MISCELLANEOUS) ×3 IMPLANT
PLATE 3HOLE COMPRESSION 30MM (Plate) ×2 IMPLANT
PLATE MTP SHORT LEFT 0DEG (Plate) IMPLANT
RASP SM TEAR CROSS CUT (RASP) ×3 IMPLANT
REAMER CUP 19MM FEMALE (MISCELLANEOUS) ×3 IMPLANT
SCREW 2.7 NON LOCK (Screw) ×2 IMPLANT
SCREW 3.0X28 (Screw) IMPLANT
SCREW BN ST 28X3.5XCANN (Screw) IMPLANT
SCREW COUNTERSINK 4.0 HEADED (MISCELLANEOUS) ×1 IMPLANT
SCREW LOCK 3 3.5X18 (Screw) ×2 IMPLANT
SCREW LOCK PLATE R3 2.7X11 (Screw) ×2 IMPLANT
SCREW LOCK PLATE R3 2.7X13 (Screw) ×2 IMPLANT
SCREW LOCK PLATE R3 2.7X14 (Screw) ×3 IMPLANT
SCREW LOCK PLATE R3 2.7X8 (Screw) ×3 IMPLANT
SCREW NON LOCK PLATE 2.7X16M (Screw) IMPLANT
SPLINT CAST 1 STEP 4X30 (MISCELLANEOUS) ×3 IMPLANT
SPLINT FAST PLASTER 5X30 (CAST SUPPLIES)
SPLINT PLASTER CAST FAST 5X30 (CAST SUPPLIES) IMPLANT
STOCKINETTE STRL 6IN 960660 (GAUZE/BANDAGES/DRESSINGS) ×3 IMPLANT
STRAP BODY AND KNEE 60X3 (MISCELLANEOUS) ×3 IMPLANT
STRIP CLOSURE SKIN 1/4X4 (GAUZE/BANDAGES/DRESSINGS) ×2 IMPLANT
SUT VIC AB 2-0 SH 27 (SUTURE)
SUT VIC AB 2-0 SH 27XBRD (SUTURE) IMPLANT
SUT VIC AB 3-0 SH 27 (SUTURE)
SUT VIC AB 3-0 SH 27X BRD (SUTURE) IMPLANT
SUT VIC AB 4-0 FS2 27 (SUTURE) ×3 IMPLANT
SUT VICRYL AB 3-0 FS1 BRD 27IN (SUTURE) IMPLANT
SYRINGE 10CC LL (SYRINGE) ×3 IMPLANT
WIRE OLIVE SMOOTH 1.4MMX60MM (WIRE) ×2 IMPLANT

## 2017-01-08 NOTE — Anesthesia Preprocedure Evaluation (Signed)
Anesthesia Evaluation  Patient identified by MRN, date of birth, ID band Patient awake    Reviewed: Allergy & Precautions, H&P , NPO status , Patient's Chart, lab work & pertinent test results, reviewed documented beta blocker date and time   Airway Mallampati: II  TM Distance: >3 FB Neck ROM: full    Dental no notable dental hx.    Pulmonary neg pulmonary ROS,    Pulmonary exam normal breath sounds clear to auscultation       Cardiovascular Exercise Tolerance: Good negative cardio ROS   Rhythm:regular Rate:Normal     Neuro/Psych  Headaches, negative psych ROS   GI/Hepatic negative GI ROS, Neg liver ROS,   Endo/Other  negative endocrine ROS  Renal/GU negative Renal ROS  negative genitourinary   Musculoskeletal  (+) Arthritis ,   Abdominal   Peds  Hematology negative hematology ROS (+)   Anesthesia Other Findings   Reproductive/Obstetrics negative OB ROS                             Anesthesia Physical Anesthesia Plan  ASA: II  Anesthesia Plan: General LMA   Post-op Pain Management:    Induction:   Airway Management Planned:   Additional Equipment:   Intra-op Plan:   Post-operative Plan:   Informed Consent: I have reviewed the patients History and Physical, chart, labs and discussed the procedure including the risks, benefits and alternatives for the proposed anesthesia with the patient or authorized representative who has indicated his/her understanding and acceptance.   Dental Advisory Given  Plan Discussed with: CRNA  Anesthesia Plan Comments:         Anesthesia Quick Evaluation

## 2017-01-08 NOTE — Op Note (Signed)
Operative note   Surgeon: Dr. Albertine Patricia, DPM.    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 Arthrodesis first MTPJ left foot          EBL: 10 cc    Anesthesia:general delivered by anesthesia team with a local block delivered by me. This consisted of preoperatively 9 cc of 0.25% Marcaine plain at the base of the operative site. Postoperatively the patient was injected with 8 cc of Exparel at the base of the operative site first metatarsal region.    Hemostasis: Ankle tourniquet 250 mm pressure for 80 minutes    Specimen: None    Complications: Original plate was positioned that had to be removed and to plates were used instead for fixation.    Operative indications: Chronic deformity and pronounced valgus deformity to the great toe.    Procedure:  Patient was brought into the OR and placed on the operating table in thesupine position. After anesthesia was obtained theleft lower extremity was prepped and draped in usual sterile fashion.  Operative Report: At this time to his directed to an old scar line over the first metatarsophalangeal joint left foot. The incision was made through that scar line longitudinally and deepened sharp blunt dissection bleeders clamped and bovied as required. Tissue was then identified and incised longitudinally just medial to the long extensor tendon. The capsular and periosteal tissue was then dissected away from the proximal phalanx base and the first metatarsal head and neck and distal shaft region. Once joints freed up a combination of cup and cone shavers were used to remove the articular cartilage from the first metatarsal head and base of proximal phalanx respectively. Once the affected cartilage was removed the 2 components were drilled with multiple fenestrated drill holes. At this time the proximal phalanx was positioned on the first metatarsal head in a good  rectus alignment. The valgus deformity was corrected and the area was pinned and a 40 screws placed across region followed by a 5 hole plate. When this was evaluated with FluoroScan however is seen that the toe was too medially position and still had too much valgus rotation to the toe. This was in spite of checking it on multiple occasions. This point I felt like there was a non-satisfactory position so this plate and screw was removed. The toe was once again rotated and fixated to a better position. There was pinned with 2 K wires and checked FluoroScan good position and correction were noted. Valgus rotation was significantly improved. This point to plates were placed across the area the dorsal plate was a 4 hole plate with a compression screw proximally the medial plate was a 3-hole screw with one screw distally and a locking pattern and a compression screw proximally and also locking screw. These were from the [facility] mini monster set. There is checked FluoroScan good position correction and fixation were noted. There was an copiously irrigated and capsule tissue was enclosed with 4 Vicryl continuous stitch as were deep superficial fascial layers. Skin was closed with 4 Vicryl in a subcuticular stitch. At this point the area was blocked with the ex-per L and then a sterile compressive dressings placed across wound consisting of Steri-Strips Xeroform gauze 4 x 4's Kling and Kerlix. Tourniquet was released and prompt complete vascularity seen to return all digits of the left foot. A posterior splint is placed on left foot leg in  the operating room. Patient is to remain nonweightbearing with this this is been expressly explained to the patient and her family.    Patient tolerated the procedure and anesthesia well.  Was transported from the OR to the PACU with all vital signs stable and vascular status intact. To be discharged per routine protocol.  Will follow up in approximately 1 week in the outpatient  clinic.

## 2017-01-08 NOTE — Anesthesia Procedure Notes (Addendum)
Procedure Name: LMA Insertion Date/Time: 01/08/2017 11:44 AM Performed by: Londell Moh Pre-anesthesia Checklist: Patient identified, Emergency Drugs available, Suction available, Timeout performed and Patient being monitored Patient Re-evaluated:Patient Re-evaluated prior to inductionOxygen Delivery Method: Circle system utilized Preoxygenation: Pre-oxygenation with 100% oxygen Intubation Type: IV induction LMA: LMA inserted LMA Size: 4.0 Number of attempts: 1 Placement Confirmation: positive ETCO2 and breath sounds checked- equal and bilateral Tube secured with: Tape

## 2017-01-08 NOTE — Anesthesia Postprocedure Evaluation (Signed)
Anesthesia Post Note  Patient: Shannon Hickman  Procedure(s) Performed: Procedure(s) (LRB): Arthrodesis Metatarsalphalangeal joint MTPJ Left (Left)  Patient location during evaluation: PACU Anesthesia Type: General Level of consciousness: awake and alert Pain management: pain level controlled Vital Signs Assessment: post-procedure vital signs reviewed and stable Respiratory status: spontaneous breathing, nonlabored ventilation, respiratory function stable and patient connected to nasal cannula oxygen Cardiovascular status: blood pressure returned to baseline and stable Postop Assessment: no signs of nausea or vomiting Anesthetic complications: no    Alisa Graff

## 2017-01-08 NOTE — Transfer of Care (Signed)
Immediate Anesthesia Transfer of Care Note  Patient: Shannon Hickman  Procedure(s) Performed: Procedure(s): Arthrodesis Metatarsalphalangeal joint MTPJ Left (Left)  Patient Location: PACU  Anesthesia Type: General LMA  Level of Consciousness: awake, alert  and patient cooperative  Airway and Oxygen Therapy: Patient Spontanous Breathing and Patient connected to supplemental oxygen  Post-op Assessment: Post-op Vital signs reviewed, Patient's Cardiovascular Status Stable, Respiratory Function Stable, Patent Airway and No signs of Nausea or vomiting  Post-op Vital Signs: Reviewed and stable  Complications: No apparent anesthesia complications

## 2017-01-13 ENCOUNTER — Encounter: Payer: Self-pay | Admitting: Podiatry

## 2017-01-14 DIAGNOSIS — M2012 Hallux valgus (acquired), left foot: Secondary | ICD-10-CM | POA: Diagnosis not present

## 2017-01-21 DIAGNOSIS — M2012 Hallux valgus (acquired), left foot: Secondary | ICD-10-CM | POA: Diagnosis not present

## 2017-02-04 DIAGNOSIS — M2012 Hallux valgus (acquired), left foot: Secondary | ICD-10-CM | POA: Diagnosis not present

## 2017-02-18 DIAGNOSIS — M2012 Hallux valgus (acquired), left foot: Secondary | ICD-10-CM | POA: Diagnosis not present

## 2017-03-04 DIAGNOSIS — M2012 Hallux valgus (acquired), left foot: Secondary | ICD-10-CM | POA: Diagnosis not present

## 2017-03-25 ENCOUNTER — Other Ambulatory Visit (INDEPENDENT_AMBULATORY_CARE_PROVIDER_SITE_OTHER): Payer: Medicare Other

## 2017-03-25 DIAGNOSIS — Z79899 Other long term (current) drug therapy: Secondary | ICD-10-CM | POA: Diagnosis not present

## 2017-03-25 DIAGNOSIS — E785 Hyperlipidemia, unspecified: Secondary | ICD-10-CM

## 2017-03-25 LAB — COMPREHENSIVE METABOLIC PANEL
ALT: 21 U/L (ref 0–35)
AST: 22 U/L (ref 0–37)
Albumin: 4.4 g/dL (ref 3.5–5.2)
Alkaline Phosphatase: 56 U/L (ref 39–117)
BILIRUBIN TOTAL: 0.5 mg/dL (ref 0.2–1.2)
BUN: 19 mg/dL (ref 6–23)
CALCIUM: 9.6 mg/dL (ref 8.4–10.5)
CHLORIDE: 103 meq/L (ref 96–112)
CO2: 30 meq/L (ref 19–32)
Creatinine, Ser: 0.76 mg/dL (ref 0.40–1.20)
GFR: 80.06 mL/min (ref >60.00)
GLUCOSE: 92 mg/dL (ref 70–99)
POTASSIUM: 5 meq/L (ref 3.5–5.1)
Sodium: 139 mEq/L (ref 135–145)
Total Protein: 7.4 g/dL (ref 6.0–8.3)

## 2017-03-25 LAB — LIPID PANEL
CHOL/HDL RATIO: 3
Cholesterol: 212 mg/dL — ABNORMAL HIGH (ref 0–200)
HDL: 75.6 mg/dL (ref >39.00)
LDL Cholesterol: 121 mg/dL — ABNORMAL HIGH (ref 0–99)
NONHDL: 136.44
TRIGLYCERIDES: 75 mg/dL (ref 0.0–149.0)
VLDL: 15 mg/dL (ref 0.0–40.0)

## 2017-03-26 ENCOUNTER — Encounter: Payer: Self-pay | Admitting: Internal Medicine

## 2017-03-27 ENCOUNTER — Other Ambulatory Visit: Payer: Self-pay | Admitting: Internal Medicine

## 2017-03-27 MED ORDER — PRAVASTATIN SODIUM 20 MG PO TABS: 20.0000 mg | ORAL_TABLET | Freq: Every day | ORAL | 1 refills | Status: DC

## 2017-03-30 ENCOUNTER — Encounter: Payer: Self-pay | Admitting: Internal Medicine

## 2017-06-10 IMAGING — DX DG FOOT 2V*R*
2 series · 2 of 2 positions shown · non-contrast
Comparison: None.

CLINICAL DATA: Right heel pain in the mornings.

EXAM:
RIGHT FOOT - 2 VIEW

[foot ap]
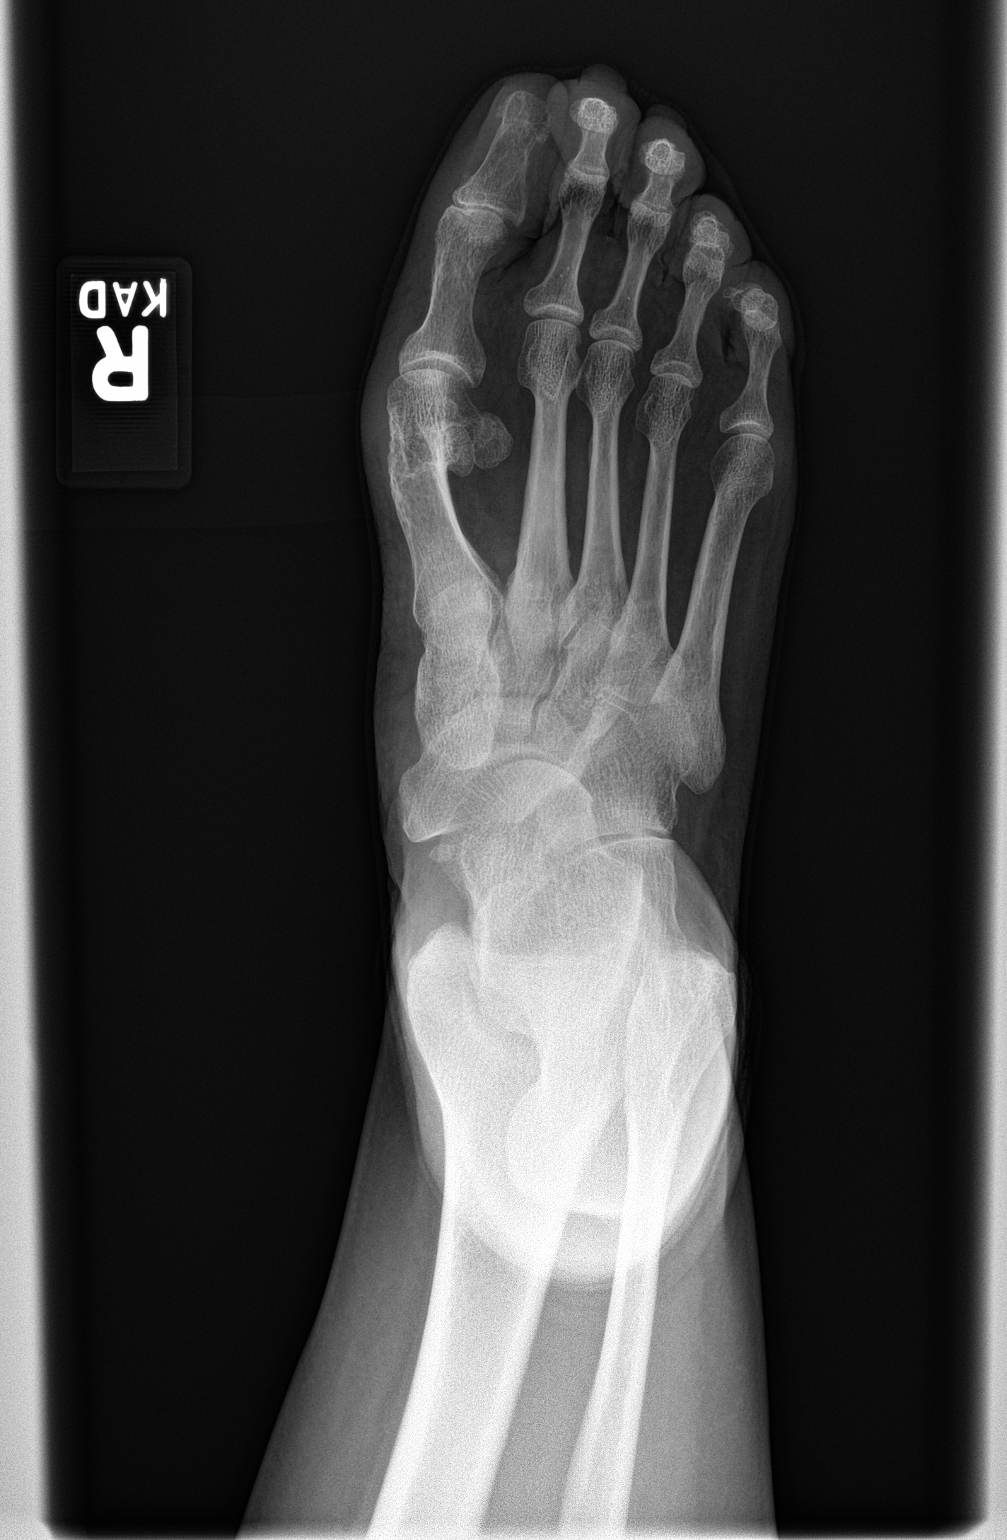

[foot lat]
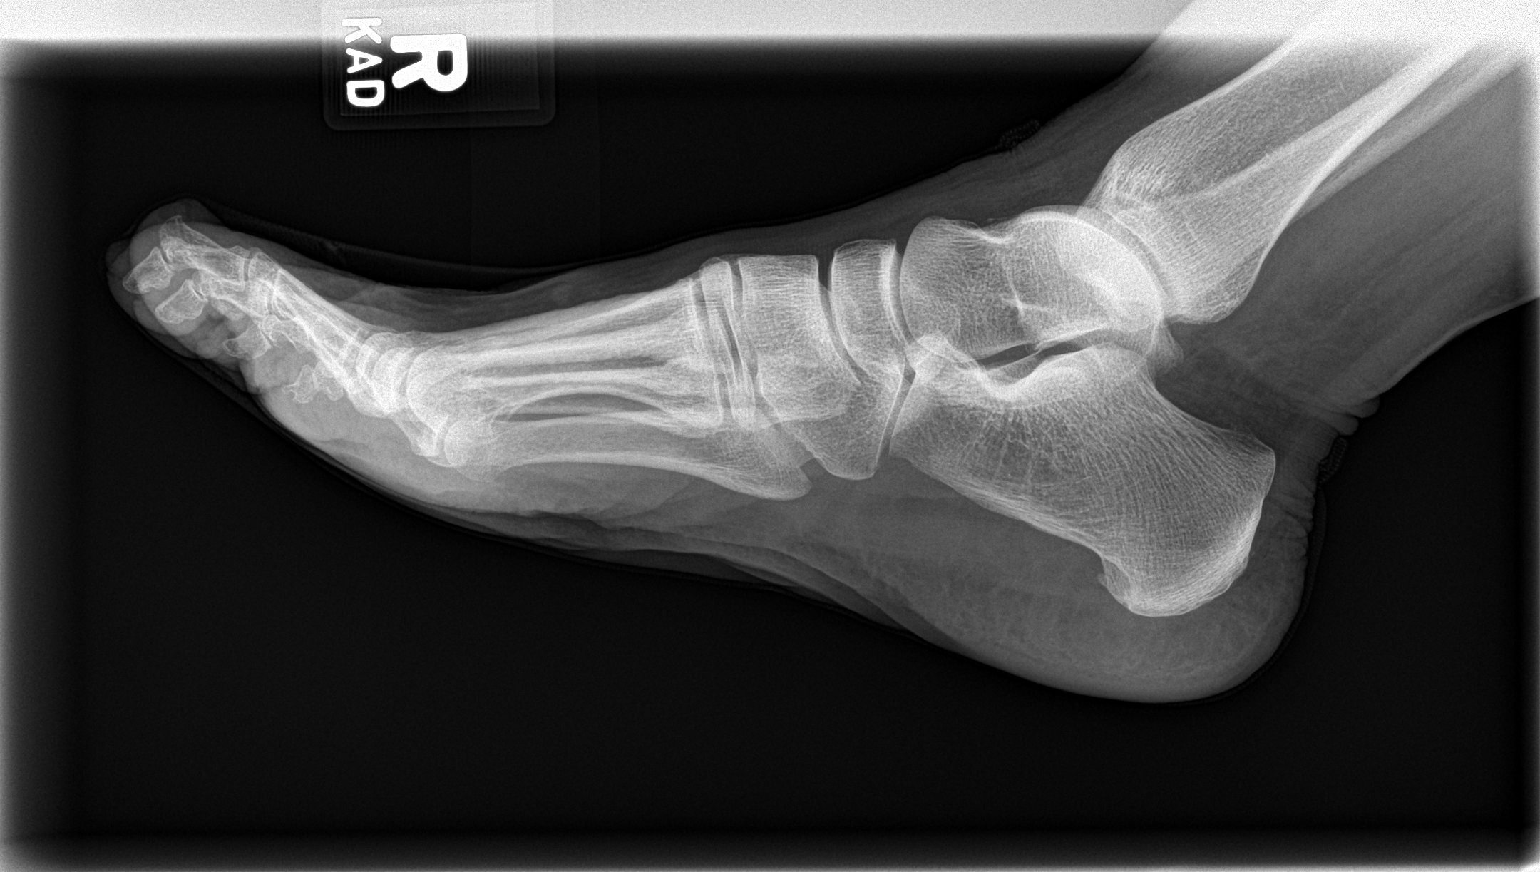

[2 of 2 positions shown; findings below may reference images not displayed]

FINDINGS: Apparent old bunionectomy changes at the first MTP joint. No acute
bony abnormality. Specifically, no fracture, subluxation, or
dislocation. Soft tissues are intact. Tiny plantar calcaneal spur
present.
IMPRESSION: Very small plantar calcaneal spur.  No acute bony abnormality.

## 2017-08-03 DIAGNOSIS — Z1231 Encounter for screening mammogram for malignant neoplasm of breast: Secondary | ICD-10-CM | POA: Diagnosis not present

## 2017-08-03 DIAGNOSIS — R921 Mammographic calcification found on diagnostic imaging of breast: Secondary | ICD-10-CM | POA: Diagnosis not present

## 2017-08-03 DIAGNOSIS — Z9882 Breast implant status: Secondary | ICD-10-CM | POA: Diagnosis not present

## 2017-08-03 LAB — HM MAMMOGRAPHY

## 2017-08-10 DIAGNOSIS — M2042 Other hammer toe(s) (acquired), left foot: Secondary | ICD-10-CM | POA: Diagnosis not present

## 2017-08-21 DIAGNOSIS — M1712 Unilateral primary osteoarthritis, left knee: Secondary | ICD-10-CM | POA: Diagnosis not present

## 2017-08-21 DIAGNOSIS — M1711 Unilateral primary osteoarthritis, right knee: Secondary | ICD-10-CM | POA: Diagnosis not present

## 2017-08-21 DIAGNOSIS — M170 Bilateral primary osteoarthritis of knee: Secondary | ICD-10-CM | POA: Diagnosis not present

## 2017-08-28 DIAGNOSIS — M1711 Unilateral primary osteoarthritis, right knee: Secondary | ICD-10-CM | POA: Diagnosis not present

## 2017-08-31 ENCOUNTER — Encounter: Payer: Self-pay | Admitting: Internal Medicine

## 2017-09-01 DIAGNOSIS — Z23 Encounter for immunization: Secondary | ICD-10-CM | POA: Diagnosis not present

## 2017-09-02 ENCOUNTER — Encounter: Payer: Self-pay | Admitting: Internal Medicine

## 2017-09-02 ENCOUNTER — Ambulatory Visit (INDEPENDENT_AMBULATORY_CARE_PROVIDER_SITE_OTHER): Payer: Medicare Other | Admitting: Internal Medicine

## 2017-09-02 VITALS — BP 142/80 | HR 76 | Temp 97.7°F | Resp 14 | Ht 64.5 in | Wt 154.4 lb

## 2017-09-02 DIAGNOSIS — Z0000 Encounter for general adult medical examination without abnormal findings: Secondary | ICD-10-CM | POA: Diagnosis not present

## 2017-09-02 DIAGNOSIS — G8929 Other chronic pain: Secondary | ICD-10-CM

## 2017-09-02 DIAGNOSIS — I10 Essential (primary) hypertension: Secondary | ICD-10-CM | POA: Diagnosis not present

## 2017-09-02 DIAGNOSIS — R030 Elevated blood-pressure reading, without diagnosis of hypertension: Secondary | ICD-10-CM | POA: Diagnosis not present

## 2017-09-02 DIAGNOSIS — Z9889 Other specified postprocedural states: Secondary | ICD-10-CM

## 2017-09-02 DIAGNOSIS — E559 Vitamin D deficiency, unspecified: Secondary | ICD-10-CM

## 2017-09-02 DIAGNOSIS — E7800 Pure hypercholesterolemia, unspecified: Secondary | ICD-10-CM

## 2017-09-02 DIAGNOSIS — R5383 Other fatigue: Secondary | ICD-10-CM

## 2017-09-02 DIAGNOSIS — Z79899 Other long term (current) drug therapy: Secondary | ICD-10-CM | POA: Diagnosis not present

## 2017-09-02 DIAGNOSIS — N763 Subacute and chronic vulvitis: Secondary | ICD-10-CM | POA: Diagnosis not present

## 2017-09-02 DIAGNOSIS — M25561 Pain in right knee: Secondary | ICD-10-CM | POA: Diagnosis not present

## 2017-09-02 LAB — COMPREHENSIVE METABOLIC PANEL
ALK PHOS: 41 U/L (ref 39–117)
ALT: 20 U/L (ref 0–35)
AST: 22 U/L (ref 0–37)
Albumin: 4.4 g/dL (ref 3.5–5.2)
BILIRUBIN TOTAL: 0.4 mg/dL (ref 0.2–1.2)
BUN: 22 mg/dL (ref 6–23)
CO2: 31 meq/L (ref 19–32)
CREATININE: 0.8 mg/dL (ref 0.40–1.20)
Calcium: 9.9 mg/dL (ref 8.4–10.5)
Chloride: 103 mEq/L (ref 96–112)
GFR: 75.37 mL/min (ref >60.00)
Glucose, Bld: 100 mg/dL — ABNORMAL HIGH (ref 70–99)
Potassium: 4.9 mEq/L (ref 3.5–5.1)
SODIUM: 140 meq/L (ref 135–145)
TOTAL PROTEIN: 7.4 g/dL (ref 6.0–8.3)

## 2017-09-02 LAB — CBC WITH DIFFERENTIAL/PLATELET
BASOS PCT: 0.4 % (ref 0.0–3.0)
Basophils Absolute: 0 10*3/uL (ref 0.0–0.1)
EOS ABS: 0 10*3/uL (ref 0.0–0.7)
EOS PCT: 0.5 % (ref 0.0–5.0)
HCT: 43.9 % (ref 36.0–46.0)
Hemoglobin: 14.6 g/dL (ref 12.0–15.0)
LYMPHS ABS: 0.5 10*3/uL — AB (ref 0.7–4.0)
Lymphocytes Relative: 9.3 % — ABNORMAL LOW (ref 12.0–46.0)
MCHC: 33.2 g/dL (ref 30.0–36.0)
MCV: 98.1 fl (ref 78.0–100.0)
Monocytes Absolute: 0.4 10*3/uL (ref 0.1–1.0)
Monocytes Relative: 8.5 % (ref 3.0–12.0)
NEUTROS ABS: 4.2 10*3/uL (ref 1.4–7.7)
Neutrophils Relative %: 81.3 % — ABNORMAL HIGH (ref 43.0–77.0)
PLATELETS: 180 10*3/uL (ref 150.0–400.0)
RBC: 4.48 Mil/uL (ref 3.87–5.11)
RDW: 13.6 % (ref 11.5–15.5)
WBC: 5.2 10*3/uL (ref 4.0–10.5)

## 2017-09-02 LAB — VITAMIN D 25 HYDROXY (VIT D DEFICIENCY, FRACTURES): VITD: 40.98 ng/mL (ref 30.00–100.00)

## 2017-09-02 LAB — TSH: TSH: 0.74 u[IU]/mL (ref 0.35–4.50)

## 2017-09-02 NOTE — Patient Instructions (Addendum)
You are doing great !    The new goals for optimal blood pressure management are 120/70.  Please check your blood pressure a few times at home and send me the readings so I can determine if you need to start a medication      The ShingRx vaccine is now available in local pharmacies and is much more protective thant Zostavax,  It is therefore ADVISED for all interested adults over 50 to prevent shingles   I want you to find 30 minutes a day to walk or do some form of exercise    Here are several low carb protein bars that make great snacks:   Power Crunch  KIND 5 g sugar variety or 3 g sugar 100 cal snack size  [employer]  Bars  Atkins  Seborrheic Keratosis Seborrheic keratosis is a common, noncancerous (benign) skin growth. This condition causes waxy, rough, tan, brown, or black spots to appear on the skin. These skin growths can be flat or raised. What are the causes? The cause of this condition is not known. What increases the risk? This condition is more likely to develop in:  People who have a family history of seborrheic keratosis.  People who are 33 or older.  People who are pregnant.  People who have had estrogen replacement therapy.  What are the signs or symptoms? This condition often occurs on the face, chest, shoulders, back, or other areas. These growths:  Are usually painless, but may become irritated and itchy.  Can be yellow, brown, black, or other colors.  Are slightly raised or have a flat surface.  Are sometimes rough or wart-like in texture.  Are often waxy on the surface.  Are round or oval-shaped.  Sometimes look like they are "stuck on."  Often occur in groups, but may occur as a single growth.  How is this diagnosed? This condition is diagnosed with a medical history and physical exam. A sample of the growth may be tested (skin biopsy). You may need to see a skin specialist (dermatologist). How is this treated? Treatment is not usually needed  for this condition, unless the growths are irritated or are often bleeding. You may also choose to have the growths removed if you do not like their appearance. Most commonly, these growths are treated with a procedure in which liquid nitrogen is applied to "freeze" off the growth (cryosurgery). They may also be burned off with electricity or cut off. Follow these instructions at home:  Watch your growth for any changes.  Keep all follow-up visits as told by your health care provider. This is important.  Do not scratch or pick at the growth or growths. This can cause them to become irritated or infected. Contact a health care provider if:  You suddenly have many new growths.  Your growth bleeds, itches, or hurts.  Your growth suddenly becomes larger or changes color. This information is not intended to replace advice given to you by your health care provider. Make sure you discuss any questions you have with your health care provider. Document Released: 01/03/2011 Document Revised: 05/08/2016 Document Reviewed: 04/18/2015 Elsevier Interactive Patient Education  2017 [employer].

## 2017-09-02 NOTE — Progress Notes (Signed)
Patient ID: Shannon Hickman, female    DOB: 06/09/1946  Age: 71 y.o. MRN: [id number]  The patient is here for annual preventive  examination and management of other chronic and acute problems.can't bend big toe but hs a normal gait    Last seen one year ago  Up to date on mammogram,  Colonoscopy and dexa scan   The risk factors are reflected in the social history.  The roster of all physicians providing medical care to patient - is listed in the Snapshot section of the chart.  Activities of daily living:  The patient is 100% independent in all ADLs: dressing, toileting, feeding as well as independent mobility  Home safety : The patient has smoke detectors in the home. They wear seatbelts.  There are no firearms at home. There is no violence in the home.   There is no risks for hepatitis, STDs or HIV. There is no   history of blood transfusion. They have no travel history to infectious disease endemic areas of the world.  The patient has seen their dentist in the last six month. They have seen their eye doctor in the last year. They admit to slight hearing difficulty with regard to whispered voices and some television programs.  They have deferred audiologic testing in the last year.  They do not  have excessive sun exposure. Discussed the need for sun protection: hats, long sleeves and use of sunscreen if there is significant sun exposure.   Diet: the importance of a healthy diet is discussed. They do have a healthy diet.  The benefits of regular aerobic exercise were discussed. She does not exercise regularly. .   Depression screen: there are no signs or vegative symptoms of depression- irritability, change in appetite, anhedonia, sadness/tearfullness.  Cognitive assessment: the patient manages all their financial and personal affairs and is actively engaged. They could relate day,date,year and events; recalled 2/3 objects at 3 minutes; performed clock-face test normally.  The following  portions of the patient's history were reviewed and updated as appropriate: allergies, current medications, past family history, past medical history,  past surgical history, past social history  and problem list.  Visual acuity was not assessed per patient preference since she has regular follow up with her ophthalmologist. Hearing and body mass index were assessed and reviewed.   During the course of the visit the patient was educated and counseled about appropriate screening and preventive services including : fall prevention , diabetes screening, nutrition counseling, colorectal cancer screening, and recommended immunizations.    CC: The primary encounter diagnosis was Fatigue, unspecified type. Diagnoses of Long-term use of high-risk medication, Vitamin D deficiency, Encounter for preventive health examination, Chronic vulvitis, S/P lateral meniscus repair of left knee, Chronic pain of right knee, Pure hypercholesterolemia, Elevated blood pressure reading in office with diagnosis of hypertension, and Elevated blood pressure reading without diagnosis of hypertension were also pertinent to this visit.    Had bunionectomy  by [facility] in January on left foot,  Currently no pain ,  Some hardware implanted but cannot feel it, except that she cannot bend her great toe.  Limitation is not inteferring with walking   Started having right knee pain in July,  Had an MRI .  Pilar Plate Aluicio , done in his office at [employer] [employer],,  Has not heard the  results .  Aches all the time, but no severe pain in instability.  Limiting her ability to rise  from kneeling position due to bilateral pain.  She is s/p left knee meniscal repair by MM in 2016   Mows the yard for exercise . Only Once a week.     History Sylena has a past medical history of Chicken pox; Diverticulitis; Heart murmur; Inflammatory polyps of colon ([place]); Migraine; and Urinary tract infection.   She has a past surgical history that includes  HISTOPLASMOSIS; Appendectomy (1998); Partial colectomy; Knee arthroscopy (Left, 08/02/2015); and Bunionectomy (Left, 01/08/2017).   Her family history includes Diabetes (age of onset: 67) in her father; Heart disease (age of onset: 96) in her father; Stomach cancer in her maternal grandfather.She reports that she has never smoked. She has never used smokeless tobacco. She reports that she does not drink alcohol. Her drug history is not on file.  Outpatient Medications Prior to Visit  Medication Sig Dispense Refill  . acetaminophen (TYLENOL) 325 MG tablet Take 650 mg by mouth every 6 (six) hours as needed.    . cetirizine (ZYRTEC) 10 MG tablet Take 10 mg by mouth daily.     . pravastatin (PRAVACHOL) 20 MG tablet Take 1 tablet (20 mg total) by mouth daily. 90 tablet 1  . calcium citrate (CALCITRATE - DOSED IN MG ELEMENTAL CALCIUM) 950 MG tablet Take 2 tablets by mouth daily.    . Cholecalciferol (VITAMIN D3) 2000 UNITS TABS Take 1 tablet by mouth daily.    . fish oil-omega-3 fatty acids 1000 MG capsule Take 2 g by mouth daily.    . multivitamin-lutein (OCUVITE-LUTEIN) CAPS Take 1 capsule by mouth daily.    . naproxen sodium (ANAPROX) 220 MG tablet Take 220 mg by mouth 2 (two) times daily with a meal.     . triamcinolone ointment (KENALOG) 0.5 % Apply 1 application topically 2 (two) times daily. (Patient not taking: Reported on 09/02/2017) 30 g 0  . vitamin E 400 UNIT capsule Take 400 Units by mouth daily.     No facility-administered medications prior to visit.     Review of Systems   Patient denies headache, fevers, malaise, unintentional weight loss, skin rash, eye pain, sinus congestion and sinus pain, sore throat, dysphagia,  hemoptysis , cough, dyspnea, wheezing, chest pain, palpitations, orthopnea, edema, abdominal pain, nausea, melena, diarrhea, constipation, flank pain, dysuria, hematuria, urinary  Frequency, nocturia, numbness, tingling, seizures,  Focal weakness, Loss of consciousness,   Tremor, insomnia, depression, anxiety, and suicidal ideation.      Objective:  BP (!) 142/80 (BP Location: Left Arm, Patient Position: Sitting, Cuff Size: Normal)   Pulse 76   Temp 97.7 F (36.5 C) (Oral)   Resp 14   Ht 5' 4.5" (1.638 m)   Wt 154 lb 6.4 oz (70 kg)   SpO2 97%   BMI 26.09 kg/m   Physical Exam   General appearance: alert, cooperative and appears stated age Head: Normocephalic, without obvious abnormality, atraumatic Eyes: conjunctivae/corneas clear. PERRL, EOM's intact. Fundi benign. Ears: normal TM's and external ear canals both ears Nose: Nares normal. Septum midline. Mucosa normal. No drainage or sinus tenderness. Throat: lips, mucosa, and tongue normal; teeth and gums normal Neck: no adenopathy, no carotid bruit, no JVD, supple, symmetrical, trachea midline and thyroid not enlarged, symmetric, no tenderness/mass/nodules Lungs: clear to auscultation bilaterally Breasts: normal appearance, no masses or tenderness Heart: regular rate and rhythm, S1, S2 normal, no murmur, click, rub or gallop Abdomen: soft, non-tender; bowel sounds normal; no masses,  no organomegaly Extremities: extremities normal, atraumatic, no cyanosis or edema Pulses: 2+ and symmetric Skin: Skin color, texture, turgor normal.  No rashes or lesions Neurologic: Alert and oriented X 3, normal strength and tone. Normal symmetric reflexes. Normal coordination and gait.      Assessment & Plan:   Problem List Items Addressed This Visit    Chronic pain of right knee    Since July 2018 managed by Adline Mango,  In house MRI results pending per patient.       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  continue clobetasol prn flares      RESOLVED: Elevated blood pressure reading in office with diagnosis of hypertension   Relevant Medications   aspirin EC 81 MG tablet   Elevated blood pressure reading without diagnosis of  hypertension    She has no history of hypertension but has had several elevated readings.  She has been asked to check her pressures at home and submit readings for evaluation. Renal function will be checked today      Encounter for preventive health examination    Annual comprehensive preventive exam was done as well as an evaluation and management of chronic conditions .  During the course of the visit the patient was educated and counseled about appropriate screening and preventive services including :  diabetes screening, lipid analysis with projected  10 year  risk for CAD , nutrition counseling, breast, cervical and colorectal cancer screening, and recommended immunizations.  Printed recommendations for health maintenance screenings was given.  Lab Results  Component Value Date   CHOL 212 (H) 03/25/2017   HDL 75.60 03/25/2017   LDLCALC 121 (H) 03/25/2017   LDLDIRECT 112.8 03/23/2013   TRIG 75.0 03/25/2017   CHOLHDL 3 03/25/2017   Lab Results  Component Value Date   TSH 0.74 09/02/2017   No results found for: HGBA1C        Hyperlipidemia    LDL and triglycerides are at goal with pravastatin  And LFTS are normal.  No changes today  Lab Results  Component Value Date   CHOL 212 (H) 03/25/2017   HDL 75.60 03/25/2017   LDLCALC 121 (H) 03/25/2017   LDLDIRECT 112.8 03/23/2013   TRIG 75.0 03/25/2017   CHOLHDL 3 03/25/2017   Lab Results  Component Value Date   ALT 20 09/02/2017   AST 22 09/02/2017   ALKPHOS 41 09/02/2017   BILITOT 0.4 09/02/2017          Relevant Medications   aspirin EC 81 MG tablet   S/P lateral meniscus repair of left knee    Other Visit Diagnoses    Fatigue, unspecified type    -  Primary   Relevant Orders   TSH (Completed)   CBC with Differential/Platelet (Completed)   Long-term use of high-risk medication       Relevant Orders   Comprehensive metabolic panel (Completed)   Vitamin D deficiency       Relevant Orders   VITAMIN D 25 Hydroxy  (Vit-D Deficiency, Fractures) (Completed)      I have discontinued Ms. Menton's naproxen sodium, calcium citrate, multivitamin-lutein, fish oil-omega-3 fatty acids, Vitamin D3, vitamin E, and triamcinolone ointment. I am also having her maintain her cetirizine, acetaminophen, pravastatin, aspirin EC, Multiple Vitamins-Minerals (CENTRUM SILVER ULTRA WOMENS PO), and CoQ10.  Meds ordered this encounter  Medications  . aspirin EC 81 MG tablet    Sig: Take 81 mg by mouth daily.  . Multiple Vitamins-Minerals (CENTRUM SILVER ULTRA WOMENS PO)    Sig: Take 1 tablet by  mouth daily.  . Coenzyme Q10 (COQ10) 100 MG CAPS    Sig: Take 1 capsule by mouth daily.    Medications Discontinued During This Encounter  Medication Reason  . calcium citrate (CALCITRATE - DOSED IN MG ELEMENTAL CALCIUM) 950 MG tablet Patient has not taken in last 30 days  . Cholecalciferol (VITAMIN D3) 2000 UNITS TABS Patient has not taken in last 30 days  . fish oil-omega-3 fatty acids 1000 MG capsule Patient has not taken in last 30 days  . multivitamin-lutein (OCUVITE-LUTEIN) CAPS Patient has not taken in last 30 days  . naproxen sodium (ANAPROX) 220 MG tablet Patient has not taken in last 30 days  . triamcinolone ointment (KENALOG) 0.5 % Patient has not taken in last 30 days  . vitamin E 400 UNIT capsule Patient has not taken in last 30 days    Follow-up: Return in about 1 year (around 09/02/2018).   Crecencio Mc, MD

## 2017-09-05 ENCOUNTER — Encounter: Payer: Self-pay | Admitting: Internal Medicine

## 2017-09-05 DIAGNOSIS — Z0000 Encounter for general adult medical examination without abnormal findings: Secondary | ICD-10-CM | POA: Insufficient documentation

## 2017-09-05 DIAGNOSIS — I10 Essential (primary) hypertension: Secondary | ICD-10-CM | POA: Insufficient documentation

## 2017-09-05 DIAGNOSIS — Z9889 Other specified postprocedural states: Secondary | ICD-10-CM | POA: Insufficient documentation

## 2017-09-05 DIAGNOSIS — M25561 Pain in right knee: Secondary | ICD-10-CM | POA: Insufficient documentation

## 2017-09-05 DIAGNOSIS — G8929 Other chronic pain: Secondary | ICD-10-CM | POA: Insufficient documentation

## 2017-09-05 NOTE — Assessment & Plan Note (Signed)
Annual comprehensive preventive exam was done as well as an evaluation and management of chronic conditions .  During the course of the visit the patient was educated and counseled about appropriate screening and preventive services including :  diabetes screening, lipid analysis with projected  10 year  risk for CAD , nutrition counseling, breast, cervical and colorectal cancer screening, and recommended immunizations.  Printed recommendations for health maintenance screenings was given.  Lab Results  Component Value Date   CHOL 212 (H) 03/25/2017   HDL 75.60 03/25/2017   LDLCALC 121 (H) 03/25/2017   LDLDIRECT 112.8 03/23/2013   TRIG 75.0 03/25/2017   CHOLHDL 3 03/25/2017   Lab Results  Component Value Date   TSH 0.74 09/02/2017   No results found for: HGBA1C

## 2017-09-05 NOTE — Assessment & Plan Note (Signed)
LDL and triglycerides are at goal with pravastatin  And LFTS are normal.  No changes today  Lab Results  Component Value Date   CHOL 212 (H) 03/25/2017   HDL 75.60 03/25/2017   LDLCALC 121 (H) 03/25/2017   LDLDIRECT 112.8 03/23/2013   TRIG 75.0 03/25/2017   CHOLHDL 3 03/25/2017   Lab Results  Component Value Date   ALT 20 09/02/2017   AST 22 09/02/2017   ALKPHOS 41 09/02/2017   BILITOT 0.4 09/02/2017

## 2017-09-05 NOTE — Assessment & Plan Note (Addendum)
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  continue clobetasol prn flares

## 2017-09-05 NOTE — Assessment & Plan Note (Signed)
She has no history of hypertension but has had several elevated readings.  She has been asked to check her pressures at home and submit readings for evaluation. Renal function will be checked today

## 2017-09-05 NOTE — Assessment & Plan Note (Signed)
Since July 2018 managed by Adline Mango,  In house MRI results pending per patient.

## 2017-09-11 DIAGNOSIS — M170 Bilateral primary osteoarthritis of knee: Secondary | ICD-10-CM | POA: Diagnosis not present

## 2017-09-19 ENCOUNTER — Encounter: Payer: Self-pay | Admitting: Internal Medicine

## 2017-09-21 MED ORDER — PRAVASTATIN SODIUM 20 MG PO TABS: 20.0000 mg | ORAL_TABLET | Freq: Every day | ORAL | 1 refills | Status: DC

## 2017-10-14 ENCOUNTER — Encounter: Payer: Self-pay | Admitting: Internal Medicine

## 2017-10-14 MED ORDER — AMLODIPINE BESYLATE 2.5 MG PO TABS: 2.5000 mg | ORAL_TABLET | Freq: Every day | ORAL | 3 refills | Status: DC

## 2017-10-30 ENCOUNTER — Other Ambulatory Visit: Payer: Self-pay | Admitting: Internal Medicine

## 2017-10-30 ENCOUNTER — Encounter: Payer: Self-pay | Admitting: Internal Medicine

## 2017-10-30 MED ORDER — AMLODIPINE BESYLATE 5 MG PO TABS: 5.0000 mg | ORAL_TABLET | Freq: Every day | ORAL | 1 refills | Status: DC

## 2017-11-04 ENCOUNTER — Encounter: Payer: Self-pay | Admitting: Internal Medicine

## 2017-11-10 DIAGNOSIS — M23231 Derangement of other medial meniscus due to old tear or injury, right knee: Secondary | ICD-10-CM | POA: Diagnosis not present

## 2017-11-10 DIAGNOSIS — M94261 Chondromalacia, right knee: Secondary | ICD-10-CM | POA: Diagnosis not present

## 2017-11-10 DIAGNOSIS — M1711 Unilateral primary osteoarthritis, right knee: Secondary | ICD-10-CM | POA: Diagnosis not present

## 2017-11-10 DIAGNOSIS — M23331 Other meniscus derangements, other medial meniscus, right knee: Secondary | ICD-10-CM | POA: Diagnosis not present

## 2017-11-10 DIAGNOSIS — G8918 Other acute postprocedural pain: Secondary | ICD-10-CM | POA: Diagnosis not present

## 2017-11-10 DIAGNOSIS — M1712 Unilateral primary osteoarthritis, left knee: Secondary | ICD-10-CM | POA: Diagnosis not present

## 2017-11-17 DIAGNOSIS — M170 Bilateral primary osteoarthritis of knee: Secondary | ICD-10-CM | POA: Diagnosis not present

## 2018-03-22 ENCOUNTER — Telehealth: Payer: Self-pay | Admitting: Internal Medicine

## 2018-03-22 DIAGNOSIS — E785 Hyperlipidemia, unspecified: Secondary | ICD-10-CM

## 2018-03-22 DIAGNOSIS — I10 Essential (primary) hypertension: Secondary | ICD-10-CM

## 2018-03-22 NOTE — Telephone Encounter (Signed)
Ordered lipid and cmp. Is there anything else that needs to be ordered?

## 2018-03-22 NOTE — Telephone Encounter (Signed)
Copied from [place]. Topic: Quick Communication - See Telephone Encounter >> Mar 22, 2018 11:19 AM Bea Graff, NT wrote: CRM for notification. See Telephone encounter for: 03/22/18. Pt states she needs to have her cholesterol checked. Need order for the lab in epic. Please call pt to schedule once order is placed.

## 2018-03-22 NOTE — Telephone Encounter (Signed)
Perfect, thanks 

## 2018-03-25 ENCOUNTER — Encounter: Payer: Self-pay | Admitting: Internal Medicine

## 2018-03-25 DIAGNOSIS — E782 Mixed hyperlipidemia: Secondary | ICD-10-CM

## 2018-03-26 ENCOUNTER — Telehealth: Payer: Self-pay | Admitting: Internal Medicine

## 2018-03-26 MED ORDER — PRAVASTATIN SODIUM 20 MG PO TABS: 20.0000 mg | ORAL_TABLET | Freq: Every day | ORAL | 0 refills | Status: DC

## 2018-03-26 NOTE — Telephone Encounter (Signed)
Please handle the following :    I will have the office call you to schedule the lab and refill your pravastatin

## 2018-03-29 NOTE — Telephone Encounter (Signed)
Please advise 

## 2018-03-30 ENCOUNTER — Other Ambulatory Visit (INDEPENDENT_AMBULATORY_CARE_PROVIDER_SITE_OTHER): Payer: Medicare Other

## 2018-03-30 DIAGNOSIS — I10 Essential (primary) hypertension: Secondary | ICD-10-CM | POA: Diagnosis not present

## 2018-03-30 DIAGNOSIS — E785 Hyperlipidemia, unspecified: Secondary | ICD-10-CM

## 2018-03-30 LAB — COMPREHENSIVE METABOLIC PANEL
ALK PHOS: 49 U/L (ref 39–117)
ALT: 22 U/L (ref 0–35)
AST: 23 U/L (ref 0–37)
Albumin: 4.4 g/dL (ref 3.5–5.2)
BILIRUBIN TOTAL: 0.5 mg/dL (ref 0.2–1.2)
BUN: 21 mg/dL (ref 6–23)
CO2: 30 mEq/L (ref 19–32)
Calcium: 10 mg/dL (ref 8.4–10.5)
Chloride: 104 mEq/L (ref 96–112)
Creatinine, Ser: 0.72 mg/dL (ref 0.40–1.20)
GFR: 84.97 mL/min (ref >60.00)
GLUCOSE: 95 mg/dL (ref 70–99)
POTASSIUM: 5.4 meq/L — AB (ref 3.5–5.1)
SODIUM: 140 meq/L (ref 135–145)
TOTAL PROTEIN: 7.4 g/dL (ref 6.0–8.3)

## 2018-03-30 LAB — LIPID PANEL
CHOLESTEROL: 241 mg/dL — AB (ref 0–200)
HDL: 76.3 mg/dL (ref >39.00)
LDL Cholesterol: 145 mg/dL — ABNORMAL HIGH (ref 0–99)
NONHDL: 164.23
Total CHOL/HDL Ratio: 3
Triglycerides: 95 mg/dL (ref 0.0–149.0)
VLDL: 19 mg/dL (ref 0.0–40.0)

## 2018-03-30 NOTE — Telephone Encounter (Signed)
Medication has been refilled and pt came in this morning for lab work.

## 2018-04-28 ENCOUNTER — Encounter: Payer: Self-pay | Admitting: Internal Medicine

## 2018-04-28 ENCOUNTER — Ambulatory Visit (INDEPENDENT_AMBULATORY_CARE_PROVIDER_SITE_OTHER): Payer: Medicare Other | Admitting: Internal Medicine

## 2018-04-28 VITALS — BP 152/80 | HR 75 | Temp 98.0°F | Resp 14 | Ht 64.5 in | Wt 158.1 lb

## 2018-04-28 DIAGNOSIS — J301 Allergic rhinitis due to pollen: Secondary | ICD-10-CM

## 2018-04-28 DIAGNOSIS — E875 Hyperkalemia: Secondary | ICD-10-CM | POA: Diagnosis not present

## 2018-04-28 DIAGNOSIS — I10 Essential (primary) hypertension: Secondary | ICD-10-CM

## 2018-04-28 DIAGNOSIS — E7800 Pure hypercholesterolemia, unspecified: Secondary | ICD-10-CM | POA: Diagnosis not present

## 2018-04-28 LAB — BASIC METABOLIC PANEL
BUN: 20 mg/dL (ref 6–23)
CALCIUM: 10 mg/dL (ref 8.4–10.5)
CO2: 29 meq/L (ref 19–32)
CREATININE: 0.72 mg/dL (ref 0.40–1.20)
Chloride: 103 mEq/L (ref 96–112)
GFR: 84.95 mL/min (ref >60.00)
GLUCOSE: 93 mg/dL (ref 70–99)
Potassium: 4.9 mEq/L (ref 3.5–5.1)
SODIUM: 139 meq/L (ref 135–145)

## 2018-04-28 MED ORDER — AMLODIPINE BESYLATE 5 MG PO TABS: 5.0000 mg | ORAL_TABLET | Freq: Every day | ORAL | 1 refills | Status: DC

## 2018-04-28 NOTE — Patient Instructions (Addendum)
  Increase your pravastatin to 40 mg daily  Using the 20 mg tablets you have.  We can repeat your cholesterol after 4-5 weeks on this dose   I am repeating your potassium level today because it was slightly elevated last month.      Your blood pressure was elevated today.  The new goals for optimal blood pressure management are 120/70.  Please check your blood pressure a few times at home and send me the readings so I can determine if you need to increase the amlodipine dose or a change in medication

## 2018-04-28 NOTE — Progress Notes (Signed)
Subjective:  Patient ID: Shannon Hickman, female    DOB: 1946/03/24  Age: 72 y.o. MRN: [id number]  CC: The primary encounter diagnosis was Hyperkalemia. Diagnoses of Pure hypercholesterolemia, Essential hypertension, and Seasonal allergic rhinitis due to pollen were also pertinent to this visit.  HPI [name] presents for follow up on hypertension, hyperlipidemia and allergic rhinitis . Patient is taking her medications as prescribed and notes no adverse effects.  Home BP readings have been done about once per week and are  generally < 130/80 .  She is avoiding added salt in her diet and walking regularly about 3 times per week for exercise  .    Outpatient Medications Prior to Visit  Medication Sig Dispense Refill  . acetaminophen (TYLENOL) 325 MG tablet Take 650 mg by mouth every 6 (six) hours as needed.    Marland Kitchen aspirin EC 81 MG tablet Take 81 mg by mouth daily.    . cetirizine (ZYRTEC) 10 MG tablet Take 10 mg by mouth daily.     . Coenzyme Q10 (COQ10) 100 MG CAPS Take 1 capsule by mouth daily.    . Multiple Vitamins-Minerals (CENTRUM SILVER ULTRA WOMENS PO) Take 1 tablet by mouth daily.    . pravastatin (PRAVACHOL) 20 MG tablet Take 1 tablet (20 mg total) by mouth daily. 90 tablet 0  . amLODipine (NORVASC) 5 MG tablet Take 1 tablet (5 mg total) daily by mouth. 90 tablet 1   No facility-administered medications prior to visit.     Review of Systems;  Patient denies headache, fevers, malaise, unintentional weight loss, skin rash, eye pain, sinus congestion and sinus pain, sore throat, dysphagia,  hemoptysis , cough, dyspnea, wheezing, chest pain, palpitations, orthopnea, edema, abdominal pain, nausea, melena, diarrhea, constipation, flank pain, dysuria, hematuria, urinary  Frequency, nocturia, numbness, tingling, seizures,  Focal weakness, Loss of consciousness,  Tremor, insomnia, depression, anxiety, and suicidal ideation.      Objective:  BP (!) 152/80 (BP Location: Left Arm,  Patient Position: Sitting, Cuff Size: Normal)   Pulse 75   Temp 98 F (36.7 C) (Oral)   Resp 14   Ht 5' 4.5" (1.638 m)   Wt 158 lb 1.9 oz (71.7 kg)   SpO2 97%   BMI 26.72 kg/m   BP Readings from Last 3 Encounters:  04/28/18 (!) 152/80  09/02/17 (!) 142/80  01/08/17 139/64    Wt Readings from Last 3 Encounters:  04/28/18 158 lb 1.9 oz (71.7 kg)  09/02/17 154 lb 6.4 oz (70 kg)  01/08/17 148 lb (67.1 kg)    General appearance: alert, cooperative and appears stated age Ears: normal TM's and external ear canals both ears Throat: lips, mucosa, and tongue normal; teeth and gums normal Neck: no adenopathy, no carotid bruit, supple, symmetrical, trachea midline and thyroid not enlarged, symmetric, no tenderness/mass/nodules Back: symmetric, no curvature. ROM normal. No CVA tenderness. Lungs: clear to auscultation bilaterally Heart: regular rate and rhythm, S1, S2 normal, no murmur, click, rub or gallop Abdomen: soft, non-tender; bowel sounds normal; no masses,  no organomegaly Pulses: 2+ and symmetric Skin: Skin color, texture, turgor normal. No rashes or lesions Lymph nodes: Cervical, supraclavicular, and axillary nodes normal.  No results found for: HGBA1C  Lab Results  Component Value Date   CREATININE 0.72 04/28/2018   CREATININE 0.72 03/30/2018   CREATININE 0.80 09/02/2017    Lab Results  Component Value Date   WBC 5.2 09/02/2017   HGB 14.6 09/02/2017   HCT 43.9 09/02/2017  PLT 180.0 09/02/2017   GLUCOSE 93 04/28/2018   CHOL 241 (H) 03/30/2018   TRIG 95.0 03/30/2018   HDL 76.30 03/30/2018   LDLDIRECT 112.8 03/23/2013   LDLCALC 145 (H) 03/30/2018   ALT 22 03/30/2018   AST 23 03/30/2018   NA 139 04/28/2018   K 4.9 04/28/2018   CL 103 04/28/2018   CREATININE 0.72 04/28/2018   BUN 20 04/28/2018   CO2 29 04/28/2018   TSH 0.74 09/02/2017   MICROALBUR 0.3 08/28/2014    No results found.  Assessment & Plan:   Problem List Items Addressed This Visit     Hyperlipidemia    LDL and triglycerides are not at goal on low dose pravastatin  And LFTS are normal.  Dose increased to 40 mg daily  Lab Results  Component Value Date   CHOL 241 (H) 03/30/2018   HDL 76.30 03/30/2018   LDLCALC 145 (H) 03/30/2018   LDLDIRECT 112.8 03/23/2013   TRIG 95.0 03/30/2018   CHOLHDL 3 03/30/2018   Lab Results  Component Value Date   ALT 22 03/30/2018   AST 23 03/30/2018   ALKPHOS 49 03/30/2018   BILITOT 0.5 03/30/2018          Relevant Medications   amLODipine (NORVASC) 5 MG tablet   Other Relevant Orders   Lipid panel   Hyperkalemia - Primary    Resolved by repeat exam today  Lab Results  Component Value Date   NA 139 04/28/2018   K 4.9 04/28/2018   CL 103 04/28/2018   CO2 29 04/28/2018         Relevant Orders   Basic metabolic panel (Completed)   Essential hypertension    Not at goal on low dose amlodipine. Home readings requested,  Discussed Increasing dose to 10 mg daily if they are > 130/80      Relevant Medications   amLODipine (NORVASC) 5 MG tablet   Allergic rhinitis    Controlled with zyrtec .        A total of 25 minutes of face to face time was spent with patient more than half of which was spent in counselling about the above mentioned conditions  and coordination of care   I have changed Shannon Hickman's amLODipine. I am also having her maintain her cetirizine, acetaminophen, aspirin EC, Multiple Vitamins-Minerals (CENTRUM SILVER ULTRA WOMENS PO), CoQ10, and pravastatin.  Meds ordered this encounter  Medications  . amLODipine (NORVASC) 5 MG tablet    Sig: Take 1 tablet (5 mg total) by mouth daily.    Dispense:  90 tablet    Refill:  1    Medications Discontinued During This Encounter  Medication Reason  . amLODipine (NORVASC) 5 MG tablet Reorder    Follow-up: Return in about 6 months (around 10/29/2018).   Crecencio Mc, MD

## 2018-05-01 DIAGNOSIS — J309 Allergic rhinitis, unspecified: Secondary | ICD-10-CM | POA: Insufficient documentation

## 2018-05-01 DIAGNOSIS — E875 Hyperkalemia: Secondary | ICD-10-CM | POA: Insufficient documentation

## 2018-05-01 NOTE — Assessment & Plan Note (Signed)
LDL and triglycerides are not at goal on low dose pravastatin  And LFTS are normal.  Dose increased to 40 mg daily  Lab Results  Component Value Date   CHOL 241 (H) 03/30/2018   HDL 76.30 03/30/2018   LDLCALC 145 (H) 03/30/2018   LDLDIRECT 112.8 03/23/2013   TRIG 95.0 03/30/2018   CHOLHDL 3 03/30/2018   Lab Results  Component Value Date   ALT 22 03/30/2018   AST 23 03/30/2018   ALKPHOS 49 03/30/2018   BILITOT 0.5 03/30/2018

## 2018-05-01 NOTE — Assessment & Plan Note (Signed)
Controlled with zyrtec 

## 2018-05-01 NOTE — Assessment & Plan Note (Signed)
Resolved by repeat exam today  Lab Results  Component Value Date   NA 139 04/28/2018   K 4.9 04/28/2018   CL 103 04/28/2018   CO2 29 04/28/2018

## 2018-05-01 NOTE — Assessment & Plan Note (Addendum)
Not at goal on low dose amlodipine. Home readings requested,  Discussed Increasing dose to 10 mg daily if they are > 130/80

## 2018-05-21 ENCOUNTER — Encounter: Payer: Self-pay | Admitting: Internal Medicine

## 2018-05-21 DIAGNOSIS — E782 Mixed hyperlipidemia: Secondary | ICD-10-CM

## 2018-05-23 MED ORDER — PRAVASTATIN SODIUM 40 MG PO TABS: 40.0000 mg | ORAL_TABLET | Freq: Every day | ORAL | 0 refills | Status: DC

## 2018-05-24 ENCOUNTER — Other Ambulatory Visit: Payer: Self-pay | Admitting: Internal Medicine

## 2018-05-24 MED ORDER — AMLODIPINE BESYLATE 10 MG PO TABS: 10.0000 mg | ORAL_TABLET | Freq: Every day | ORAL | 1 refills | Status: DC

## 2018-05-28 ENCOUNTER — Other Ambulatory Visit (INDEPENDENT_AMBULATORY_CARE_PROVIDER_SITE_OTHER): Payer: Medicare Other

## 2018-05-28 DIAGNOSIS — E7800 Pure hypercholesterolemia, unspecified: Secondary | ICD-10-CM

## 2018-05-28 LAB — LIPID PANEL
Cholesterol: 212 mg/dL — ABNORMAL HIGH (ref 0–200)
HDL: 73.9 mg/dL (ref >39.00)
LDL Cholesterol: 121 mg/dL — ABNORMAL HIGH (ref 0–99)
NonHDL: 137.84
TRIGLYCERIDES: 84 mg/dL (ref 0.0–149.0)
Total CHOL/HDL Ratio: 3
VLDL: 16.8 mg/dL (ref 0.0–40.0)

## 2018-07-26 DIAGNOSIS — H2513 Age-related nuclear cataract, bilateral: Secondary | ICD-10-CM | POA: Diagnosis not present

## 2018-07-26 DIAGNOSIS — H524 Presbyopia: Secondary | ICD-10-CM | POA: Diagnosis not present

## 2018-07-26 DIAGNOSIS — H43811 Vitreous degeneration, right eye: Secondary | ICD-10-CM | POA: Diagnosis not present

## 2018-08-05 DIAGNOSIS — Z1231 Encounter for screening mammogram for malignant neoplasm of breast: Secondary | ICD-10-CM | POA: Diagnosis not present

## 2018-08-05 LAB — HM MAMMOGRAPHY

## 2018-08-18 DIAGNOSIS — Z23 Encounter for immunization: Secondary | ICD-10-CM | POA: Diagnosis not present

## 2018-08-21 ENCOUNTER — Other Ambulatory Visit: Payer: Self-pay | Admitting: Internal Medicine

## 2018-08-21 DIAGNOSIS — E782 Mixed hyperlipidemia: Secondary | ICD-10-CM

## 2018-09-06 ENCOUNTER — Encounter: Payer: Self-pay | Admitting: Internal Medicine

## 2018-09-06 ENCOUNTER — Ambulatory Visit (INDEPENDENT_AMBULATORY_CARE_PROVIDER_SITE_OTHER): Payer: Medicare Other | Admitting: Internal Medicine

## 2018-09-06 VITALS — BP 142/68 | HR 79 | Temp 98.1°F | Resp 14 | Ht 64.5 in | Wt 147.8 lb

## 2018-09-06 DIAGNOSIS — M85859 Other specified disorders of bone density and structure, unspecified thigh: Secondary | ICD-10-CM

## 2018-09-06 DIAGNOSIS — Z79899 Other long term (current) drug therapy: Secondary | ICD-10-CM

## 2018-09-06 DIAGNOSIS — E7800 Pure hypercholesterolemia, unspecified: Secondary | ICD-10-CM | POA: Diagnosis not present

## 2018-09-06 LAB — COMPREHENSIVE METABOLIC PANEL
ALBUMIN: 4.5 g/dL (ref 3.5–5.2)
ALK PHOS: 44 U/L (ref 39–117)
ALT: 19 U/L (ref 0–35)
AST: 21 U/L (ref 0–37)
BUN: 17 mg/dL (ref 6–23)
CO2: 31 mEq/L (ref 19–32)
CREATININE: 0.76 mg/dL (ref 0.40–1.20)
Calcium: 10 mg/dL (ref 8.4–10.5)
Chloride: 102 mEq/L (ref 96–112)
GFR: 79.73 mL/min (ref >60.00)
Glucose, Bld: 93 mg/dL (ref 70–99)
Potassium: 4.9 mEq/L (ref 3.5–5.1)
SODIUM: 139 meq/L (ref 135–145)
TOTAL PROTEIN: 7.5 g/dL (ref 6.0–8.3)
Total Bilirubin: 0.5 mg/dL (ref 0.2–1.2)

## 2018-09-06 NOTE — Progress Notes (Signed)
Patient ID: Shannon Hickman, female    DOB: [birth year]  Age: 72 y.o. MRN: [id number]  The patient is here for follow up and  management of other chronic and acute problems.   The risk factors are reflected in the social history.  The roster of all physicians providing medical care to patient - is listed in the Snapshot section of the chart.  Activities of daily living:  The patient is 100% independent in all ADLs: dressing, toileting, feeding as well as independent mobility  Home safety : The patient has smoke detectors in the home. They wear seatbelts.  There are no firearms at home. There is no violence in the home.   There is no risks for hepatitis, STDs or HIV. There is no   history of blood transfusion. They have no travel history to infectious disease endemic areas of the world.  The patient has seen their dentist in the last six month. They have seen their eye doctor in the last year. They admit to slight hearing difficulty with regard to whispered voices and some television programs.  They have deferred audiologic testing in the last year.  They do not  have excessive sun exposure. Discussed the need for sun protection: hats, long sleeves and use of sunscreen if there is significant sun exposure.   Diet: the importance of a healthy diet is discussed. They do have a healthy diet. She has lost 11 lbs since august 1 adhering to 1100 calorie diet   The benefits of regular aerobic exercise were discussed. She walks on a treadmill for 30 minutes 3 times daily    Depression screen: there are no signs or vegative symptoms of depression- irritability, change in appetite, anhedonia, sadness/tearfullness.  Cognitive assessment: the patient manages all their financial and personal affairs and is actively engaged. They could relate day,date,year and events; recalled 2/3 objects at 3 minutes; performed clock-face test normally.  The following portions of the patient's history were reviewed and updated  as appropriate: allergies, current medications, past family history, past medical history,  past surgical history, past social history  and problem list.  Visual acuity was not assessed per patient preference since she has regular follow up with her ophthalmologist. Hearing and body mass index were assessed and reviewed.   During the course of the visit the patient was educated and counseled about appropriate screening and preventive services including : fall prevention , diabetes screening, nutrition counseling, colorectal cancer screening, and recommended immunizations.    CC: The primary encounter diagnosis was Long-term use of high-risk medication. Diagnoses of Osteopenia of neck of femur, unspecified laterality and Pure hypercholesterolemia were also pertinent to this visit.  1)  Hypertension: patient checks blood pressure twice weekly at home.  Readings have been for the most part <130/80 at rest . Patient is following a reduce salt diet most days and is taking medications as prescribed . 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   She has a past surgical history that includes HISTOPLASMOSIS; Appendectomy (1998); Partial colectomy; Knee arthroscopy (Left, 08/02/2015); and Bunionectomy (Left, 01/08/2017).   Her family history includes Diabetes (age of onset: 83) in her father; Heart disease (age of onset: 43) in her father; Stomach cancer in her maternal grandfather.She reports that she has never smoked. She has never used smokeless tobacco. She reports that she does not drink alcohol. Her drug history is not  on file.  Outpatient Medications Prior to Visit  Medication Sig Dispense Refill  . acetaminophen (TYLENOL) 325 MG tablet Take 650 mg by mouth every 6 (six) hours as needed.    Marland Kitchen amLODipine (NORVASC) 10 MG tablet Take 1 tablet (10 mg total) by mouth daily. 90  tablet 1  . aspirin EC 81 MG tablet Take 81 mg by mouth daily.    . cetirizine (ZYRTEC) 10 MG tablet Take 10 mg by mouth daily.     . Coenzyme Q10 (COQ10) 100 MG CAPS Take 1 capsule by mouth daily.    . Multiple Vitamins-Minerals (CENTRUM SILVER ULTRA WOMENS PO) Take 1 tablet by mouth daily.    . pravastatin (PRAVACHOL) 40 MG tablet TAKE 1 TABLET BY MOUTH ONCE DAILY 90 tablet 1   No facility-administered medications prior to visit.     Review of Systems   Patient denies headache, fevers, malaise, unintentional weight loss, skin rash, eye pain, sinus congestion and sinus pain, sore throat, dysphagia,  hemoptysis , cough, dyspnea, wheezing, chest pain, palpitations, orthopnea, edema, abdominal pain, nausea, melena, diarrhea, constipation, flank pain, dysuria, hematuria, urinary  Frequency, nocturia, numbness, tingling, seizures,  Focal weakness, Loss of consciousness,  Tremor, insomnia, depression, anxiety, and suicidal ideation.      Objective:  BP (!) 142/68 (BP Location: Left Arm, Patient Position: Sitting, Cuff Size: Normal)   Pulse 79   Temp 98.1 F (36.7 C) (Oral)   Resp 14   Ht 5' 4.5" (1.638 m)   Wt 147 lb 12.8 oz (67 kg)   SpO2 98%   BMI 24.98 kg/m   Physical Exam   General appearance: alert, cooperative and appears stated age Head: Normocephalic, without obvious abnormality, atraumatic Eyes: conjunctivae/corneas clear. PERRL, EOM's intact. Fundi benign. Ears: normal TM's and external ear canals both ears Nose: Nares normal. Septum midline. Mucosa normal. No drainage or sinus tenderness. Throat: lips, mucosa, and tongue normal; teeth and gums normal Neck: no adenopathy, no carotid bruit, no JVD, supple, symmetrical, trachea midline and thyroid not enlarged, symmetric, no tenderness/mass/nodules Lungs: clear to auscultation bilaterally Breasts: normal appearance, no masses or tenderness Heart: regular rate and rhythm, S1, S2 normal, no murmur, click, rub or  gallop Abdomen: soft, non-tender; bowel sounds normal; no masses,  no organomegaly Extremities: extremities normal, atraumatic, no cyanosis or edema Pulses: 2+ and symmetric Skin: Skin color, texture, turgor normal. No rashes or lesions Neurologic: Alert and oriented X 3, normal strength and tone. Normal symmetric reflexes. Normal coordination and gait.      Assessment & Plan:   Problem List Items Addressed This Visit    Hyperlipidemia    LDL and triglycerides areat goal on  pravastatin  And LFTS are normal. No change today Lab Results  Component Value Date   CHOL 212 (H) 05/28/2018   HDL 73.90 05/28/2018   LDLCALC 121 (H) 05/28/2018   LDLDIRECT 112.8 03/23/2013   TRIG 84.0 05/28/2018   CHOLHDL 3 05/28/2018   Lab Results  Component Value Date   ALT 19 09/06/2018   AST 21 09/06/2018   ALKPHOS 44 09/06/2018   BILITOT 0.5 09/06/2018          Osteopenia    Bone Density scores received, she has osteopenia,  Moderate.  Would repeat in 2 years and consider therapy then if there is a significant change. Continue calcium, vitamin d and weight bearing exercise on a regular basis.        Other Visit Diagnoses    Long-term use of  high-risk medication    -  Primary   Relevant Orders   Comprehensive metabolic panel (Completed)     A total of 25 minutes was spent with patient more than half of which was spent in counseling patient on the above mentioned issues , reviewing and explaining recent labs and imaging studies done, and coordination of care.   I am having [name] maintain her cetirizine, acetaminophen, aspirin EC, Multiple Vitamins-Minerals (CENTRUM SILVER ULTRA WOMENS PO), CoQ10, amLODipine, and pravastatin.  No orders of the defined types were placed in this encounter.   There are no discontinued medications.  Follow-up: Return in about 6 months (around 03/07/2019).   Crecencio Mc, MD

## 2018-09-06 NOTE — Patient Instructions (Addendum)
Your BMI is now < 25  Great job!     I  recommend getting the majority of your calcium and Vitamin D  through diet rather than supplements given the recent association of calcium supplements with increased coronary artery calcium scores.  You nee 1200 mg of calcium and 800 to 2000 ius of Vit D3 daily   Try the soy milk   ; great source of calcium (also see list)  We will repeat your DEXA scan next year  Return in 6 months for follow up,  fasting labs prior to next visit    Health Maintenance for Postmenopausal Women Menopause is a normal process in which your reproductive ability comes to an end. This process happens gradually over a span of months to years, usually between the ages of 60 and 36. Menopause is complete when you have missed 12 consecutive menstrual periods. It is important to talk with your health care provider about some of the most common conditions that affect postmenopausal women, such as heart disease, cancer, and bone loss (osteoporosis). Adopting a healthy lifestyle and getting preventive care can help to promote your health and wellness. Those actions can also lower your chances of developing some of these common conditions. What should I know about menopause? During menopause, you may experience a number of symptoms, such as:  Moderate-to-severe hot flashes.  Night sweats.  Decrease in sex drive.  Mood swings.  Headaches.  Tiredness.  Irritability.  Memory problems.  Insomnia.  Choosing to treat or not to treat menopausal changes is an individual decision that you make with your health care provider. What should I know about hormone replacement therapy and supplements? Hormone therapy products are effective for treating symptoms that are associated with menopause, such as hot flashes and night sweats. Hormone replacement carries certain risks, especially as you become older. If you are thinking about using estrogen or estrogen with progestin treatments,  discuss the benefits and risks with your health care provider. What should I know about heart disease and stroke? Heart disease, heart attack, and stroke become more likely as you age. This may be due, in part, to the hormonal changes that your body experiences during menopause. These can affect how your body processes dietary fats, triglycerides, and cholesterol. Heart attack and stroke are both medical emergencies. There are many things that you can do to help prevent heart disease and stroke:  Have your blood pressure checked at least every 1-2 years. High blood pressure causes heart disease and increases the risk of stroke.  If you are 65-17 years old, ask your health care provider if you should take aspirin to prevent a heart attack or a stroke.  Do not use any tobacco products, including cigarettes, chewing tobacco, or electronic cigarettes. If you need help quitting, ask your health care provider.  It is important to eat a healthy diet and maintain a healthy weight. ? Be sure to include plenty of vegetables, fruits, low-fat dairy products, and lean protein. ? Avoid eating foods that are high in solid fats, added sugars, or salt (sodium).  Get regular exercise. This is one of the most important things that you can do for your health. ? Try to exercise for at least 150 minutes each week. The type of exercise that you do should increase your heart rate and make you sweat. This is known as moderate-intensity exercise. ? Try to do strengthening exercises at least twice each week. Do these in addition to the moderate-intensity exercise.  Know your numbers.Ask your health care provider to check your cholesterol and your blood glucose. Continue to have your blood tested as directed by your health care provider.  What should I know about cancer screening? There are several types of cancer. Take the following steps to reduce your risk and to catch any cancer development as early as  possible. Breast Cancer  Practice breast self-awareness. ? This means understanding how your breasts normally appear and feel. ? It also means doing regular breast self-exams. Let your health care provider know about any changes, no matter how small.  If you are 2 or older, have a clinician do a breast exam (clinical breast exam or CBE) every year. Depending on your age, family history, and medical history, it may be recommended that you also have a yearly breast X-ray (mammogram).  If you have a family history of breast cancer, talk with your health care provider about genetic screening.  If you are at high risk for breast cancer, talk with your health care provider about having an MRI and a mammogram every year.  Breast cancer (BRCA) gene test is recommended for women who have family members with BRCA-related cancers. Results of the assessment will determine the need for genetic counseling and BRCA1 and for BRCA2 testing. BRCA-related cancers include these types: ? Breast. This occurs in males or females. ? Ovarian. ? Tubal. This may also be called fallopian tube cancer. ? Cancer of the abdominal or pelvic lining (peritoneal cancer). ? Prostate. ? Pancreatic.  Cervical, Uterine, and Ovarian Cancer Your health care provider may recommend that you be screened regularly for cancer of the pelvic organs. These include your ovaries, uterus, and vagina. This screening involves a pelvic exam, which includes checking for microscopic changes to the surface of your cervix (Pap test).  For women ages 21-65, health care providers may recommend a pelvic exam and a Pap test every three years. For women ages 61-65, they may recommend the Pap test and pelvic exam, combined with testing for human papilloma virus (HPV), every five years. Some types of HPV increase your risk of cervical cancer. Testing for HPV may also be done on women of any age who have unclear Pap test results.  Other health care  providers may not recommend any screening for nonpregnant women who are considered low risk for pelvic cancer and have no symptoms. Ask your health care provider if a screening pelvic exam is right for you.  If you have had past treatment for cervical cancer or a condition that could lead to cancer, you need Pap tests and screening for cancer for at least 20 years after your treatment. If Pap tests have been discontinued for you, your risk factors (such as having a new sexual partner) need to be reassessed to determine if you should start having screenings again. Some women have medical problems that increase the chance of getting cervical cancer. In these cases, your health care provider may recommend that you have screening and Pap tests more often.  If you have a family history of uterine cancer or ovarian cancer, talk with your health care provider about genetic screening.  If you have vaginal bleeding after reaching menopause, tell your health care provider.  There are currently no reliable tests available to screen for ovarian cancer.  Lung Cancer Lung cancer screening is recommended for adults 53-42 years old who are at high risk for lung cancer because of a history of smoking. A yearly low-dose CT scan of the  lungs is recommended if you:  Currently smoke.  Have a history of at least 30 pack-years of smoking and you currently smoke or have quit within the past 15 years. A pack-year is smoking an average of one pack of cigarettes per day for one year.  Yearly screening should:  Continue until it has been 15 years since you quit.  Stop if you develop a health problem that would prevent you from having lung cancer treatment.  Colorectal Cancer  This type of cancer can be detected and can often be prevented.  Routine colorectal cancer screening usually begins at age 75 and continues through age 80.  If you have risk factors for colon cancer, your health care provider may recommend  that you be screened at an earlier age.  If you have a family history of colorectal cancer, talk with your health care provider about genetic screening.  Your health care provider may also recommend using home test kits to check for hidden blood in your stool.  A small camera at the end of a tube can be used to examine your colon directly (sigmoidoscopy or colonoscopy). This is done to check for the earliest forms of colorectal cancer.  Direct examination of the colon should be repeated every 5-10 years until age 91. However, if early forms of precancerous polyps or small growths are found or if you have a family history or genetic risk for colorectal cancer, you may need to be screened more often.  Skin Cancer  Check your skin from head to toe regularly.  Monitor any moles. Be sure to tell your health care provider: ? About any new moles or changes in moles, especially if there is a change in a mole's shape or color. ? If you have a mole that is larger than the size of a pencil eraser.  If any of your family members has a history of skin cancer, especially at a young age, talk with your health care provider about genetic screening.  Always use sunscreen. Apply sunscreen liberally and repeatedly throughout the day.  Whenever you are outside, protect yourself by wearing long sleeves, pants, a wide-brimmed hat, and sunglasses.  What should I know about osteoporosis? Osteoporosis is a condition in which bone destruction happens more quickly than new bone creation. After menopause, you may be at an increased risk for osteoporosis. To help prevent osteoporosis or the bone fractures that can happen because of osteoporosis, the following is recommended:  If you are 74-48 years old, get at least 1,000 mg of calcium and at least 600 mg of vitamin D per day.  If you are older than age 71 but younger than age 52, get at least 1,200 mg of calcium and at least 600 mg of vitamin D per day.  If you  are older than age 56, get at least 1,200 mg of calcium and at least 800 mg of vitamin D per day.  Smoking and excessive alcohol intake increase the risk of osteoporosis. Eat foods that are rich in calcium and vitamin D, and do weight-bearing exercises several times each week as directed by your health care provider. What should I know about how menopause affects my mental health? Depression may occur at any age, but it is more common as you become older. Common symptoms of depression include:  Low or sad mood.  Changes in sleep patterns.  Changes in appetite or eating patterns.  Feeling an overall lack of motivation or enjoyment of activities that you previously  enjoyed.  Frequent crying spells.  Talk with your health care provider if you think that you are experiencing depression. What should I know about immunizations? It is important that you get and maintain your immunizations. These include:  Tetanus, diphtheria, and pertussis (Tdap) booster vaccine.  Influenza every year before the flu season begins.  Pneumonia vaccine.  Shingles vaccine.  Your health care provider may also recommend other immunizations. This information is not intended to replace advice given to you by your health care provider. Make sure you discuss any questions you have with your health care provider. Document Released: 01/23/2006 Document Revised: 06/20/2016 Document Reviewed: 09/04/2015 Elsevier Interactive Patient Education  2018 [employer].

## 2018-09-06 NOTE — Assessment & Plan Note (Signed)
LDL and triglycerides areat goal on  pravastatin  And LFTS are normal. No change today Lab Results  Component Value Date   CHOL 212 (H) 05/28/2018   HDL 73.90 05/28/2018   LDLCALC 121 (H) 05/28/2018   LDLDIRECT 112.8 03/23/2013   TRIG 84.0 05/28/2018   CHOLHDL 3 05/28/2018   Lab Results  Component Value Date   ALT 19 09/06/2018   AST 21 09/06/2018   ALKPHOS 44 09/06/2018   BILITOT 0.5 09/06/2018

## 2018-09-06 NOTE — Assessment & Plan Note (Signed)
Bone Density scores received, she has osteopenia,  Moderate.  Would repeat in 2 years and consider therapy then if there is a significant change. Continue calcium, vitamin d and weight bearing exercise on a regular basis.  

## 2018-10-29 ENCOUNTER — Ambulatory Visit: Payer: Medicare Other | Admitting: Internal Medicine

## 2018-12-10 ENCOUNTER — Other Ambulatory Visit: Payer: Self-pay | Admitting: Internal Medicine

## 2018-12-10 MED ORDER — AMLODIPINE BESYLATE 10 MG PO TABS: 10.0000 mg | ORAL_TABLET | Freq: Every day | ORAL | 1 refills | Status: DC

## 2018-12-10 MED ORDER — AMLODIPINE BESYLATE 10 MG PO TABS: 10.0000 mg | ORAL_TABLET | Freq: Every day | ORAL | 0 refills | Status: DC

## 2018-12-10 NOTE — Telephone Encounter (Signed)
Requested Prescriptions  Pending Prescriptions Disp Refills  . amLODipine (NORVASC) 10 MG tablet 90 tablet 0    Sig: Take 1 tablet (10 mg total) by mouth daily.     Cardiovascular:  Calcium Channel Blockers Failed - 12/10/2018 11:07 AM      Failed - Last BP in normal range    BP Readings from Last 1 Encounters:  09/06/18 (!) 142/68         Passed - Valid encounter within last 6 months    Recent Outpatient Visits          3 months ago Long-term use of high-risk medication   [facility] Crecencio Mc, MD   7 months ago Hyperkalemia   [place] [facility] Crecencio Mc, MD   1 year ago Encounter for preventive health examination   [place] [facility] Crecencio Mc, MD   2 years ago Encounter for Medicare annual wellness exam   [facility] Crecencio Mc, MD   3 years ago Osteopenia   [facility] Crecencio Mc, MD      Future Appointments            In 3 months Derrel Nip, Aris Everts, MD [facility], [facility]

## 2018-12-10 NOTE — Telephone Encounter (Signed)
Copied from [facility] [id number]. Topic: Quick Communication - Rx Refill/Question >> Dec 10, 2018 10:57 AM Sheppard Coil, [employer] wrote: Medication: amLODipine (NORVASC) 10 MG tablet  Pt requested via [facility] on 12/22, but hasn't heard anything back.  Has the patient contacted their pharmacy? No - she was waiting to hear back from MyChart message (Agent: If no, request that the patient contact the pharmacy for the refill.) (Agent: If yes, when and what did the pharmacy advise?)  Preferred Pharmacy (with phone number or street name): [facility] [address] [place] - [place] [phone] (Phone) [phone] (Fax)  Agent: Please be advised that RX refills may take up to 3 business days. We ask that you follow-up with your pharmacy.

## 2019-01-05 DIAGNOSIS — R05 Cough: Secondary | ICD-10-CM | POA: Diagnosis not present

## 2019-01-05 DIAGNOSIS — J019 Acute sinusitis, unspecified: Secondary | ICD-10-CM | POA: Diagnosis not present

## 2019-02-14 ENCOUNTER — Other Ambulatory Visit: Payer: Self-pay

## 2019-02-14 DIAGNOSIS — E782 Mixed hyperlipidemia: Secondary | ICD-10-CM

## 2019-02-14 MED ORDER — PRAVASTATIN SODIUM 40 MG PO TABS: 40.0000 mg | ORAL_TABLET | Freq: Every day | ORAL | 1 refills | Status: DC

## 2019-02-28 MED ORDER — AMLODIPINE BESYLATE 10 MG PO TABS: 10.0000 mg | ORAL_TABLET | Freq: Every day | ORAL | 1 refills | Status: DC

## 2019-03-10 ENCOUNTER — Other Ambulatory Visit: Payer: Self-pay

## 2019-03-10 ENCOUNTER — Encounter: Payer: Self-pay | Admitting: Internal Medicine

## 2019-03-10 ENCOUNTER — Ambulatory Visit (INDEPENDENT_AMBULATORY_CARE_PROVIDER_SITE_OTHER): Payer: Medicare Other | Admitting: Internal Medicine

## 2019-03-10 VITALS — BP 110/70 | HR 73 | Temp 97.8°F | Wt 133.2 lb

## 2019-03-10 DIAGNOSIS — G8929 Other chronic pain: Secondary | ICD-10-CM

## 2019-03-10 DIAGNOSIS — M25561 Pain in right knee: Secondary | ICD-10-CM | POA: Diagnosis not present

## 2019-03-10 DIAGNOSIS — E875 Hyperkalemia: Secondary | ICD-10-CM | POA: Diagnosis not present

## 2019-03-10 DIAGNOSIS — I10 Essential (primary) hypertension: Secondary | ICD-10-CM

## 2019-03-10 DIAGNOSIS — E782 Mixed hyperlipidemia: Secondary | ICD-10-CM | POA: Diagnosis not present

## 2019-03-10 LAB — COMPREHENSIVE METABOLIC PANEL
ALBUMIN: 4.4 g/dL (ref 3.5–5.2)
ALK PHOS: 45 U/L (ref 39–117)
ALT: 21 U/L (ref 0–35)
AST: 24 U/L (ref 0–37)
BUN: 16 mg/dL (ref 6–23)
CALCIUM: 9.5 mg/dL (ref 8.4–10.5)
CO2: 31 mEq/L (ref 19–32)
Chloride: 104 mEq/L (ref 96–112)
Creatinine, Ser: 0.71 mg/dL (ref 0.40–1.20)
GFR: 81.03 mL/min (ref >60.00)
Glucose, Bld: 89 mg/dL (ref 70–99)
Potassium: 4.3 mEq/L (ref 3.5–5.1)
Sodium: 140 mEq/L (ref 135–145)
TOTAL PROTEIN: 7 g/dL (ref 6.0–8.3)
Total Bilirubin: 0.6 mg/dL (ref 0.2–1.2)

## 2019-03-10 LAB — LIPID PANEL
CHOLESTEROL: 230 mg/dL — AB (ref 0–200)
HDL: 76.6 mg/dL (ref >39.00)
LDL Cholesterol: 139 mg/dL — ABNORMAL HIGH (ref 0–99)
NonHDL: 153.62
TRIGLYCERIDES: 75 mg/dL (ref 0.0–149.0)
Total CHOL/HDL Ratio: 3
VLDL: 15 mg/dL (ref 0.0–40.0)

## 2019-03-10 NOTE — Patient Instructions (Signed)
Your blood pressure is well controlled on amlodipine I would like you to  take it in the evening  instead of morning,  Because a well controlled recent study noted that patients  Who took their blood pressure medications at night had fewer   heart attacks and strokes.

## 2019-03-10 NOTE — Progress Notes (Signed)
Subjective:  Patient ID: Shannon Hickman, female    DOB: 10/31/1946  Age: 73 y.o. MRN: [id number]  CC: The primary encounter diagnosis was Mixed hyperlipidemia. Diagnoses of Chronic pain of right knee, Hyperkalemia, and Essential hypertension were also pertinent to this visit.  HPI Shannon Hickman presents for follow up on hypertension and hyperlipidemia managed with and amlodipine   Patient is taking her medications as prescribed and notes no adverse effects.  Home BP readings have been done about once per week and are  generally < 130/80 .  She is avoiding added salt in her diet and walking regularly about 5 times per week for exercise  . She has lost 14 lbs intentionally   Her mother is Shannon Hickman .   Outpatient Medications Prior to Visit  Medication Sig Dispense Refill  . acetaminophen (TYLENOL) 325 MG tablet Take 650 mg by mouth every 6 (six) hours as needed.    Marland Kitchen amLODipine (NORVASC) 10 MG tablet Take 1 tablet (10 mg total) by mouth daily. 90 tablet 1  . aspirin EC 81 MG tablet Take 81 mg by mouth daily.    . cetirizine (ZYRTEC) 10 MG tablet Take 10 mg by mouth daily.     . Coenzyme Q10 (COQ10) 100 MG CAPS Take 1 capsule by mouth daily.    . Multiple Vitamins-Minerals (CENTRUM SILVER ULTRA WOMENS PO) Take 1 tablet by mouth daily.    . pravastatin (PRAVACHOL) 40 MG tablet Take 1 tablet (40 mg total) by mouth daily. 90 tablet 1   No facility-administered medications prior to visit.     Review of Systems;  Patient denies headache, fevers, malaise, unintentional weight loss, skin rash, eye pain, sinus congestion and sinus pain, sore throat, dysphagia,  hemoptysis , cough, dyspnea, wheezing, chest pain, palpitations, orthopnea, edema, abdominal pain, nausea, melena, diarrhea, constipation, flank pain, dysuria, hematuria, urinary  Frequency, nocturia, numbness, tingling, seizures,  Focal weakness, Loss of consciousness,  Tremor, insomnia, depression, anxiety, and suicidal ideation.       Objective:  BP 110/70   Pulse 73   Temp 97.8 F (36.6 C) (Oral)   Wt 133 lb 3.2 oz (60.4 kg)   SpO2 96%   BMI 22.51 kg/m   BP Readings from Last 3 Encounters:  03/10/19 110/70  09/06/18 (!) 142/68  04/28/18 (!) 152/80    Wt Readings from Last 3 Encounters:  03/10/19 133 lb 3.2 oz (60.4 kg)  09/06/18 147 lb 12.8 oz (67 kg)  04/28/18 158 lb 1.9 oz (71.7 kg)    General appearance: alert, cooperative and appears stated age Ears: normal TM's and external ear canals both ears Throat: lips, mucosa, and tongue normal; teeth and gums normal Neck: no adenopathy, no carotid bruit, supple, symmetrical, trachea midline and thyroid not enlarged, symmetric, no tenderness/mass/nodules Back: symmetric, no curvature. ROM normal. No CVA tenderness. Lungs: clear to auscultation bilaterally Heart: regular rate and rhythm, S1, S2 normal, no murmur, click, rub or gallop Abdomen: soft, non-tender; bowel sounds normal; no masses,  no organomegaly Pulses: 2+ and symmetric Skin: Skin color, texture, turgor normal. No rashes or lesions Lymph nodes: Cervical, supraclavicular, and axillary nodes normal.  No results found for: HGBA1C  Lab Results  Component Value Date   CREATININE 0.71 03/10/2019   CREATININE 0.76 09/06/2018   CREATININE 0.72 04/28/2018    Lab Results  Component Value Date   WBC 5.2 09/02/2017   HGB 14.6 09/02/2017   HCT 43.9 09/02/2017   PLT 180.0 09/02/2017  GLUCOSE 89 03/10/2019   CHOL 230 (H) 03/10/2019   TRIG 75.0 03/10/2019   HDL 76.60 03/10/2019   LDLDIRECT 112.8 03/23/2013   LDLCALC 139 (H) 03/10/2019   ALT 21 03/10/2019   AST 24 03/10/2019   NA 140 03/10/2019   K 4.3 03/10/2019   CL 104 03/10/2019   CREATININE 0.71 03/10/2019   BUN 16 03/10/2019   CO2 31 03/10/2019   TSH 0.74 09/02/2017   MICROALBUR 0.3 08/28/2014    No results found.  Assessment & Plan:   Problem List Items Addressed This Visit    Essential hypertension    Well controlled on  current regimen. Renal function stable, no changes today. Lab Results  Component Value Date   CREATININE 0.71 03/10/2019   Lab Results  Component Value Date   NA 140 03/10/2019   K 4.3 03/10/2019   CL 104 03/10/2019   CO2 31 03/10/2019         Hyperlipidemia - Primary    LDL and triglyceride are at goal on  Pravastatin.  10 yr risk using FRC is now 8%.    And LFTS are normal. No change today Lab Results  Component Value Date   CHOL 230 (H) 03/10/2019   HDL 76.60 03/10/2019   LDLCALC 139 (H) 03/10/2019   LDLDIRECT 112.8 03/23/2013   TRIG 75.0 03/10/2019   CHOLHDL 3 03/10/2019   Lab Results  Component Value Date   ALT 21 03/10/2019   AST 24 03/10/2019   ALKPHOS 45 03/10/2019   BILITOT 0.6 03/10/2019          Relevant Orders   Lipid panel (Completed)   Chronic pain of right knee   Hyperkalemia   Relevant Orders   Comprehensive metabolic panel (Completed)      I am having Shannon Hickman maintain her cetirizine, acetaminophen, aspirin EC, Multiple Vitamins-Minerals (CENTRUM SILVER ULTRA WOMENS PO), CoQ10, pravastatin, and amLODipine.  No orders of the defined types were placed in this encounter.   There are no discontinued medications.  Follow-up: Return in about 6 months (around 09/10/2019) for CPe.   Shannon Mc, MD

## 2019-03-12 NOTE — Assessment & Plan Note (Addendum)
Well controlled on current regimen. Renal function stable, no changes today. Lab Results  Component Value Date   CREATININE 0.71 03/10/2019   Lab Results  Component Value Date   NA 140 03/10/2019   K 4.3 03/10/2019   CL 104 03/10/2019   CO2 31 03/10/2019

## 2019-03-12 NOTE — Assessment & Plan Note (Signed)
LDL and triglyceride are at goal on  Pravastatin.  10 yr risk using FRC is now 8%.    And LFTS are normal. No change today Lab Results  Component Value Date   CHOL 230 (H) 03/10/2019   HDL 76.60 03/10/2019   LDLCALC 139 (H) 03/10/2019   LDLDIRECT 112.8 03/23/2013   TRIG 75.0 03/10/2019   CHOLHDL 3 03/10/2019   Lab Results  Component Value Date   ALT 21 03/10/2019   AST 24 03/10/2019   ALKPHOS 45 03/10/2019   BILITOT 0.6 03/10/2019

## 2019-04-05 ENCOUNTER — Telehealth: Payer: Self-pay | Admitting: Internal Medicine

## 2019-04-05 NOTE — Telephone Encounter (Signed)
Called patient to schedule AWV, but patient did not answer; could not leave voicemail. SF

## 2019-07-28 DIAGNOSIS — Z23 Encounter for immunization: Secondary | ICD-10-CM | POA: Diagnosis not present

## 2019-08-02 ENCOUNTER — Ambulatory Visit (INDEPENDENT_AMBULATORY_CARE_PROVIDER_SITE_OTHER): Payer: Medicare Other

## 2019-08-02 ENCOUNTER — Other Ambulatory Visit: Payer: Self-pay

## 2019-08-02 DIAGNOSIS — Z0000 Encounter for general adult medical examination without abnormal findings: Secondary | ICD-10-CM

## 2019-08-02 NOTE — Progress Notes (Addendum)
Subjective:   Shannon Hickman is a 73 y.o. female who presents for Medicare Annual (Subsequent) preventive examination.  Review of Systems:  No ROS.  Medicare Wellness Virtual Visit.  Visual/audio telehealth visit, UTA vital signs.   See social history for additional risk factors.   Cardiac Risk Factors include: advanced age (>46men, >48 women);hypertension     Objective:     Vitals: There were no vitals taken for this visit.  There is no height or weight on file to calculate BMI.  Advanced Directives 08/02/2019 01/08/2017 07/31/2015  Does Patient Have a Medical Advance Directive? Yes Yes Yes  Type of Paramedic of [place];Living will [place];Living will [place];Living will  Does patient want to make changes to medical advance directive? No - Patient declined - -  Copy of [place] in Chart? No - copy requested - -    Tobacco Social History   Tobacco Use  Smoking Status Never Smoker  Smokeless Tobacco Never Used     Counseling given: Not Answered   Clinical Intake:  Pre-visit preparation completed: Yes        Diabetes: No  How often do you need to have someone help you when you read instructions, pamphlets, or other written materials from your doctor or pharmacy?: 1 - Never  Interpreter Needed?: No     Past Medical History:  Diagnosis Date  . Chicken pox   . Diverticulitis   . Heart murmur   . Inflammatory polyps of colon ([place])   . Migraine   . Urinary tract infection    Past Surgical History:  Procedure Laterality Date  . APPENDECTOMY  1998  . BUNIONECTOMY Left 01/08/2017   Procedure: Arthrodesis Metatarsalphalangeal joint MTPJ Left;  Surgeon: Albertine Patricia, DPM;  Location: [facility];  Service: Podiatry;  Laterality: Left;  . HISTOPLASMOSIS    . KNEE ARTHROSCOPY Left 08/02/2015   Procedure: ARTHROSCOPY KNEE, PARTIAL MEDIAL MENISCECTOMY;  Surgeon: Hessie Knows, MD;  Location: [facility];  Service: Orthopedics;  Laterality: Left;  . PARTIAL COLECTOMY     diverticular rupture, Rochel Brome    Family History  Problem Relation Age of Onset  . Heart disease Father 97  . Diabetes Father 72  . Stomach cancer Maternal Grandfather   . Cancer Neg Hx    Social History   Socioeconomic History  . Marital status: Married    Spouse name: Not on file  . Number of children: Not on file  . Years of education: Not on file  . Highest education level: Not on file  Occupational History  . Not on file  Social Needs  . Financial resource strain: Not hard at all  . Food insecurity    Worry: Never true    Inability: Never true  . Transportation needs    Medical: No    Non-medical: No  Tobacco Use  . Smoking status: Never Smoker  . Smokeless tobacco: Never Used  Substance and Sexual Activity  . Alcohol use: No  . Drug use: Not on file  . Sexual activity: Yes  Lifestyle  . Physical activity    Days per week: 2 days    Minutes per session: 20 min  . Stress: Not at all  Relationships  . Social Herbalist on phone: Not on file    Gets together: Not on file    Attends religious service: Not on file    Active member  of club or organization: Not on file    Attends meetings of clubs or organizations: Not on file    Relationship status: Not on file  Other Topics Concern  . Not on file  Social History Narrative  . Not on file    Outpatient Encounter Medications as of 08/02/2019  Medication Sig  . acetaminophen (TYLENOL) 325 MG tablet Take 650 mg by mouth every 6 (six) hours as needed.  Marland Kitchen amLODipine (NORVASC) 10 MG tablet Take 1 tablet (10 mg total) by mouth daily.  Marland Kitchen aspirin EC 81 MG tablet Take 81 mg by mouth daily.  . cetirizine (ZYRTEC) 10 MG tablet Take 10 mg by mouth daily.   . Coenzyme Q10 (COQ10) 100 MG CAPS Take 1 capsule by mouth daily.  . Multiple Vitamins-Minerals (CENTRUM SILVER ULTRA WOMENS PO) Take 1 tablet by mouth  daily.  . pravastatin (PRAVACHOL) 40 MG tablet Take 1 tablet (40 mg total) by mouth daily.   No facility-administered encounter medications on file as of 08/02/2019.     Activities of Daily Living In your present state of health, do you have any difficulty performing the following activities: 08/02/2019  Hearing? N  Vision? N  Difficulty concentrating or making decisions? N  Walking or climbing stairs? N  Dressing or bathing? N  Doing errands, shopping? N  Preparing Food and eating ? N  Using the Toilet? N  In the past six months, have you accidently leaked urine? N  Do you have problems with loss of bowel control? N  Managing your Medications? N  Managing your Finances? N  Housekeeping or managing your Housekeeping? N  Some recent data might be hidden    Patient Care Team: Crecencio Mc, MD as PCP - General (Internal Medicine)    Assessment:   This is a routine wellness examination for Shannon Hickman.  I connected with patient 08/02/19 at  8:30 AM EDT by an audio enabled telemedicine application and verified that I am speaking with the correct person using two identifiers. Patient stated full name and DOB. Patient gave permission to continue with virtual visit. Patient's location was at home and Nurse's location was at [place] office.   Health Maintenance Due: See completed HM at the end of note.   Eye- Visual acuity not assessed. Virtual visit. Wears corrective lenses. Followed by their ophthalmologist every 12 months.   Dental- every 6 months.    Hearing-demonstrates normal hearing during visit.  Safety  Patient feels safe at home- yes Patient does have smoke detectors at home- yes Patient does wear sunscreen or protective clothing when in direct sunlight - yes Patient does wear seat belt when in a moving vehicle - yes Patient drives- yes Adequate lighting in walkways free from debris- yes Grab bars and handrails used as appropriate- yes Ambulates with no assistive device   Social  Alcohol intake - no      Smoking history- never   Smokers in home? None Illicit drug use? None  Covid-19 precautions and sickness symptoms discussed. Wears mask as appropriate.   Activities of Daily Living Patient denies needing assistance with: household chores, feeding themselves, getting from bed to chair, getting to the toilet, bathing/showering, dressing, managing money, or preparing meals.  No failures at ADL's or IADL's.   Medication- taking all medications as directed  Memory: Patient is alert. Patient denies difficulty focusing or concentrating. Correctly identified the president of the [place], season and recall. Patient likes to read, play computer games, complete puzzles for brain  stimulation.  BMI- discussed the importance of a healthy diet, water intake and the benefits of aerobic exercise.  Educational material provided.  Physical activity- no routine. Encouraged to stay active.   Diet: Weight watchers Water: good intake Caffeine: none Ensure/Protein supplement: none  Advanced Directive End of life planning; Advance aging; Advanced directives discussed.  Copy of current HCPOA/Living Will requested.    Other Providers Patient Care Team: Crecencio Mc, MD as PCP - General (Internal Medicine)  Exercise Activities and Dietary recommendations Current Exercise Habits: The patient does not participate in regular exercise at present  Goals      Patient Stated   . Increase physical activity (pt-stated)     Purchase and use stationary bike once        Fall Risk Fall Risk  08/02/2019 09/06/2018 09/02/2017 09/01/2016 08/31/2015  Falls in the past year? 0 No No No Yes  Number falls in past yr: - - - - 1  Injury with Fall? - - - - No  Follow up - - - - Falls prevention discussed   Depression Screen PHQ 2/9 Scores 08/02/2019 09/06/2018 09/02/2017 09/01/2016  PHQ - 2 Score 0 0 0 0  PHQ- 9 Score - 0 1 -    Depression- PHQ 2 &9 complete.  There are no  signs/symptoms or verbal communication regarding depression, irritability, anhedonia, sadness/tearfullness.   Cognitive Function     6CIT Screen 08/02/2019  What Year? 0 points  What month? 0 points  What time? 0 points  Count back from 20 0 points  Months in reverse 0 points  Repeat phrase 0 points  Total Score 0    Immunization History  Administered Date(s) Administered  . Influenza Split 09/15/2012, 09/28/2014, 08/26/2016  . Influenza, High Dose Seasonal PF 09/01/2017, 08/18/2018, 07/28/2019  . Influenza-Unspecified 08/15/2013, 09/18/2014, 08/28/2015, 08/27/2016, 09/01/2017  . Pneumococcal Conjugate-13 08/25/2014  . Pneumococcal Polysaccharide-23 03/16/2013, 07/28/2019  . Tdap 01/16/2011  . Zoster 01/16/2011   Immunizations The following Immunizations were discussed: Influenza, shingles, pneumonia, and tetanus.   Screening Tests Health Maintenance  Topic Date Due  . MAMMOGRAM  08/06/2019  . TETANUS/TDAP  01/16/2021  . COLONOSCOPY  06/20/2022  . INFLUENZA VACCINE  Completed  . DEXA SCAN  Completed  . Hepatitis C Screening  Completed  . PNA vac Low Risk Adult  Completed     Plan:   Keep all routine maintenance appointments.   Cpe -09/12/19  Medicare Attestation I have personally reviewed: The patient's medical and social history Their use of alcohol, tobacco or illicit drugs Their current medications and supplements The patient's functional ability including ADLs,fall risks, home safety risks, cognitive, and hearing and visual impairment Diet and physical activities Evidence for depression   In addition, I have reviewed and discussed with patient certain preventive protocols, quality metrics, and best practice recommendations. A written personalized care plan for preventive services as well as general preventive health recommendations were provided to patient.     OBrien-Blaney, Denisa L, LPN  7/67/3419   I have reviewed the above information and agree with  above.   Deborra Medina, MD

## 2019-08-02 NOTE — Patient Instructions (Addendum)
  Shannon Hickman , Thank you for taking time to come for your Medicare Wellness Visit. I appreciate your ongoing commitment to your health goals. Please review the following plan we discussed and let me know if I can assist you in the future.   These are the goals we discussed: Goals      Patient Stated   . Increase physical activity (pt-stated)     Purchase and use stationary bike once        This is a list of the screening recommended for you and due dates:  Health Maintenance  Topic Date Due  . Mammogram  08/06/2019  . Tetanus Vaccine  01/16/2021  . Colon Cancer Screening  06/20/2022  . Flu Shot  Completed  . DEXA scan (bone density measurement)  Completed  .  Hepatitis C: One time screening is recommended by Center for Disease Control  (CDC) for  adults born from [birth year] through 1964.   Completed  . Pneumonia vaccines  Completed

## 2019-08-15 DIAGNOSIS — Z1231 Encounter for screening mammogram for malignant neoplasm of breast: Secondary | ICD-10-CM | POA: Diagnosis not present

## 2019-08-15 LAB — HM MAMMOGRAPHY

## 2019-08-15 MED ORDER — PRAVASTATIN SODIUM 40 MG PO TABS: 40.0000 mg | ORAL_TABLET | Freq: Every day | ORAL | 1 refills | Status: DC

## 2019-08-25 ENCOUNTER — Encounter: Payer: Self-pay | Admitting: Internal Medicine

## 2019-09-05 ENCOUNTER — Other Ambulatory Visit: Payer: Self-pay

## 2019-09-05 MED ORDER — AMLODIPINE BESYLATE 10 MG PO TABS: 10.0000 mg | ORAL_TABLET | Freq: Every day | ORAL | 1 refills | Status: DC

## 2019-09-12 ENCOUNTER — Encounter: Payer: Medicare Other | Admitting: Internal Medicine

## 2019-10-12 ENCOUNTER — Other Ambulatory Visit: Payer: Self-pay

## 2019-10-14 ENCOUNTER — Encounter: Payer: Self-pay | Admitting: Internal Medicine

## 2019-10-14 ENCOUNTER — Other Ambulatory Visit: Payer: Self-pay

## 2019-10-14 ENCOUNTER — Ambulatory Visit (INDEPENDENT_AMBULATORY_CARE_PROVIDER_SITE_OTHER): Payer: Medicare Other | Admitting: Internal Medicine

## 2019-10-14 VITALS — BP 130/68 | HR 71 | Temp 97.5°F | Ht 62.95 in | Wt 139.2 lb

## 2019-10-14 DIAGNOSIS — I10 Essential (primary) hypertension: Secondary | ICD-10-CM

## 2019-10-14 DIAGNOSIS — Z780 Asymptomatic menopausal state: Secondary | ICD-10-CM | POA: Diagnosis not present

## 2019-10-14 DIAGNOSIS — E782 Mixed hyperlipidemia: Secondary | ICD-10-CM | POA: Diagnosis not present

## 2019-10-14 DIAGNOSIS — Z0000 Encounter for general adult medical examination without abnormal findings: Secondary | ICD-10-CM

## 2019-10-14 MED ORDER — ZOSTER VAC RECOMB ADJUVANTED 50 MCG/0.5ML IM SUSR
0.5000 mL | Freq: Once | INTRAMUSCULAR | 1 refills | Status: AC
Start: 2019-10-14 — End: 2019-10-14

## 2019-10-14 NOTE — Progress Notes (Signed)
Patient ID: Shannon Hickman, female    DOB: 1946/05/31  Age: 73 y.o. MRN: [id number]  The patient is here for annual follow up and management of other chronic and acute problems.   The risk factors are reflected in the social history.  The roster of all physicians providing medical care to patient - is listed in the Snapshot section of the chart.  Activities of daily living:  The patient is 100% independent in all ADLs: dressing, toileting, feeding as well as independent mobility  Home safety : The patient has smoke detectors in the home. They wear seatbelts.  There are no firearms at home. There is no violence in the home.   There is no risks for hepatitis, STDs or HIV. There is no   history of blood transfusion. They have no travel history to infectious disease endemic areas of the world.  The patient has seen their dentist in the last six month. They have seen their eye doctor in the last year. She denies  hearing difficulty with regard to whispered voices and some television programs.  They have deferred audiologic testing in the last year.  They do not  have excessive sun exposure. Discussed the need for sun protection: hats, long sleeves and use of sunscreen if there is significant sun exposure.   Diet: the importance of a healthy diet is discussed. She does have a healthy diet.  The benefits of regular aerobic exercise were discussed. She walks 4 times per week ,  20 minutes.   Depression screen: there are no signs or vegative symptoms of depression- irritability, change in appetite, anhedonia, sadness/tearfullness.  Cognitive assessment: the patient manages all their financial and personal affairs and is actively engaged. They could relate day,date,year and events; recalled 2/3 objects at 3 minutes; performed clock-face test normally.  The following portions of the patient's history were reviewed and updated as appropriate: allergies, current medications, past family history, past  medical history,  past surgical history, past social history  and problem list.  Visual acuity was not assessed per patient preference since she has regular follow up with her ophthalmologist. Hearing and body mass index were assessed and reviewed.   During the course of the visit the patient was educated and counseled about appropriate screening and preventive services including : fall prevention , diabetes screening, nutrition counseling, colorectal cancer screening, and recommended immunizations.    CC: The primary encounter diagnosis was Postmenopausal estrogen deficiency. Diagnoses of Mixed hyperlipidemia, Essential hypertension, and Encounter for preventive health examination were also pertinent to this visit.  1) hyperlipidemia managed with pravastatin.  Not fasting. Tolerating meds.   2)  Hypertension managed with amlodipine.  Does not check readings at home.  No side effects from medications  3) The patient has no signs or symptoms of COVID 19 infection (fever, cough, sore throat  or shortness of breath beyond what is typical for patient).  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   She has a past surgical history that includes HISTOPLASMOSIS; Appendectomy (1998); Partial colectomy; Knee arthroscopy (Left, 08/02/2015); and Bunionectomy (Left, 01/08/2017).   Her family history includes Diabetes (age of onset: 42) in her father; Heart disease (age of onset: 70) in her father; Stomach cancer in her maternal grandfather.She reports that she has  never smoked. She has never used smokeless tobacco. She reports that she does not drink alcohol. No history on file for drug.  Outpatient Medications Prior to Visit  Medication Sig Dispense Refill  .  acetaminophen (TYLENOL) 325 MG tablet Take 650 mg by mouth every 6 (six) hours as needed.    Marland Kitchen amLODipine (NORVASC) 10 MG tablet Take 1 tablet (10 mg total) by mouth daily. 90 tablet 1  . aspirin EC 81 MG tablet Take 81 mg by mouth daily.    . cetirizine (ZYRTEC) 10 MG tablet Take 10 mg by mouth daily.     . Coenzyme Q10 (COQ10) 100 MG CAPS Take 1 capsule by mouth daily.    . Multiple Vitamins-Minerals (CENTRUM SILVER ULTRA WOMENS PO) Take 1 tablet by mouth daily.    . pravastatin (PRAVACHOL) 40 MG tablet Take 1 tablet (40 mg total) by mouth daily. 90 tablet 1   No facility-administered medications prior to visit.     Review of Systems   Patient denies headache, fevers, malaise, unintentional weight loss, skin rash, eye pain, sinus congestion and sinus pain, sore throat, dysphagia,  hemoptysis , cough, dyspnea, wheezing, chest pain, palpitations, orthopnea, edema, abdominal pain, nausea, melena, diarrhea, constipation, flank pain, dysuria, hematuria, urinary  Frequency, nocturia, numbness, tingling, seizures,  Focal weakness, Loss of consciousness,  Tremor, insomnia, depression, anxiety, and suicidal ideation.      Objective:  BP 130/68   Pulse 71   Temp (!) 97.5 F (36.4 C) (Temporal)   Ht 5' 2.95" (1.599 m)   Wt 139 lb 3.2 oz (63.1 kg)   SpO2 98%   BMI 24.70 kg/m   Physical Exam   General appearance: alert, cooperative and appears stated age Head: Normocephalic, without obvious abnormality, atraumatic Eyes: conjunctivae/corneas clear. PERRL, EOM's intact. Fundi benign. Ears: normal TM's and external ear canals both ears Nose: Nares normal. Septum midline. Mucosa normal. No drainage or sinus tenderness. Throat: lips, mucosa, and tongue normal; teeth and gums normal Neck: no adenopathy, no carotid bruit, no JVD, supple, symmetrical, trachea midline and thyroid not enlarged, symmetric, no tenderness/mass/nodules Lungs: clear to auscultation bilaterally Breasts: normal appearance,  no masses or tenderness Heart: regular rate and rhythm, S1, S2 normal, no murmur, click, rub or gallop Abdomen: soft, non-tender; bowel sounds normal; no masses,  no organomegaly Extremities: extremities normal, atraumatic, no cyanosis or edema Pulses: 2+ and symmetric Skin: Skin color, texture, turgor normal. No rashes or lesions Neurologic: Alert and oriented X 3, normal strength and tone. Normal symmetric reflexes. Normal coordination and gait.      Assessment & Plan:   Problem List Items Addressed This Visit      Unprioritized   Essential hypertension    Well controlled on current regimen. Renal function stable, no changes today.  Lab Results  Component Value Date   CREATININE 0.73 10/14/2019   Lab Results  Component Value Date   NA 140 10/14/2019   K 4.8 10/14/2019   CL 103 10/14/2019   CO2 26 10/14/2019         Relevant Orders   Comprehensive metabolic panel (Completed)   Hyperlipidemia    LDL and triglyceride are at goal on  Pravastatin.  10 yr risk using FRC is now 8%.    And LFTS are normal. No change today Lab Results  Component Value Date   CHOL 211 (H) 10/14/2019   HDL 82 10/14/2019   LDLCALC 110 (H) 10/14/2019   LDLDIRECT 104 (H) 10/14/2019   TRIG 99  10/14/2019   CHOLHDL 2.6 10/14/2019   Lab Results  Component Value Date   ALT 17 10/14/2019   AST 21 10/14/2019   ALKPHOS 45 03/10/2019   BILITOT 0.4 10/14/2019          Relevant Orders   Lipid panel (Completed)   Direct LDL (Completed)   TSH (Completed)   Encounter for preventive health examination    age appropriate education and counseling updated, referrals for preventative services and immunizations addressed, dietary and smoking counseling addressed, most recent labs reviewed.  I have personally reviewed and have noted:  1) the patient's medical and social history 2) The pt's use of alcohol, tobacco, and illicit drugs 3) The patient's current medications and supplements 4) Functional  ability including ADL's, fall risk, home safety risk, hearing and visual impairment 5) Diet and physical activities 6) Evidence for depression or mood disorder 7) The patient's height, weight, and BMI have been recorded in the chart  I have made referrals, and provided counseling and education based on review of the above      Postmenopausal estrogen deficiency - Primary   Relevant Orders   DG Bone Density      I am having [name] start on Zoster Vaccine Adjuvanted. I am also having her maintain her cetirizine, acetaminophen, aspirin EC, Multiple Vitamins-Minerals (CENTRUM SILVER ULTRA WOMENS PO), CoQ10, pravastatin, and amLODipine.  Meds ordered this encounter  Medications  . Zoster Vaccine Adjuvanted [facility]) injection    Sig: Inject 0.5 mLs into the muscle once for 1 dose.    Dispense:  1 each    Refill:  1    There are no discontinued medications.  Follow-up: No follow-ups on file.   Crecencio Mc, MD

## 2019-10-14 NOTE — Patient Instructions (Signed)
Your bone density (DEXA scan) has been ordered.  You are encouraged  to call to make your appointment at [facility]     The ShingRx vaccine is now available in local pharmacies and is much more protective than the old one  Zostavax  (it is about 97%  Effective in preventing shingles). .   It is therefore ADVISED for all interested adults over 50 to prevent shingles so I have printed you a prescription for it.  (it requires a 2nd dose 2 to 6 months after the first one) .  It will cause you to have flu  like symptoms for 2 days If your pharmacy is not available to give it to you,  The [facility] pharmacy will give it to you.  They are located on the ground floor of the [place] [facility] After Age 72 After age 10, you are at a higher risk for certain long-term diseases and infections as well as injuries from falls. Falls are a major cause of broken bones and head injuries in people who are older than age 92. Getting regular preventive care can help to keep you healthy and well. Preventive care includes getting regular testing and making lifestyle changes as recommended by your health care provider. Talk with your health care provider about:  Which screenings and tests you should have. A screening is a test that checks for a disease when you have no symptoms.  A diet and exercise plan that is right for you. What should I know about screenings and tests to prevent falls? Screening and testing are the best ways to find a health problem early. Early diagnosis and treatment give you the best chance of managing medical conditions that are common after age 69. Certain conditions and lifestyle choices may make you more likely to have a fall. Your health care provider may recommend:  Regular vision checks. Poor vision and conditions such as cataracts can make you more likely to have a fall. If you wear glasses, make sure to get your prescription updated if  your vision changes.  Medicine review. Work with your health care provider to regularly review all of the medicines you are taking, including over-the-counter medicines. Ask your health care provider about any side effects that may make you more likely to have a fall. Tell your health care provider if any medicines that you take make you feel dizzy or sleepy.  Osteoporosis screening. Osteoporosis is a condition that causes the bones to get weaker. This can make the bones weak and cause them to break more easily.  Blood pressure screening. Blood pressure changes and medicines to control blood pressure can make you feel dizzy.  Strength and balance checks. Your health care provider may recommend certain tests to check your strength and balance while standing, walking, or changing positions.  Foot health exam. Foot pain and numbness, as well as not wearing proper footwear, can make you more likely to have a fall.  Depression screening. You may be more likely to have a fall if you have a fear of falling, feel emotionally low, or feel unable to do activities that you used to do.  Alcohol use screening. Using too much alcohol can affect your balance and may make you more likely to have a fall. What actions can I take to lower my risk of falls? General instructions  Talk with your health care provider about your risks for falling. Tell your health  care provider if: ? You fall. Be sure to tell your health care provider about all falls, even ones that seem minor. ? You feel dizzy, sleepy, or off-balance.  Take over-the-counter and prescription medicines only as told by your health care provider. These include any supplements.  Eat a healthy diet and maintain a healthy weight. A healthy diet includes low-fat dairy products, low-fat (lean) meats, and fiber from whole grains, beans, and lots of fruits and vegetables. Home safety  Remove any tripping hazards, such as rugs, cords, and  clutter.  Install safety equipment such as grab bars in bathrooms and safety rails on stairs.  Keep rooms and walkways well-lit. Activity   Follow a regular exercise program to stay fit. This will help you maintain your balance. Ask your health care provider what types of exercise are appropriate for you.  If you need a cane or walker, use it as recommended by your health care provider.  Wear supportive shoes that have nonskid soles. Lifestyle  Do not drink alcohol if your health care provider tells you not to drink.  If you drink alcohol, limit how much you have: ? 0-1 drink a day for women. ? 0-2 drinks a day for men.  Be aware of how much alcohol is in your drink. In the [place] one drink equals one typical bottle of beer (12 oz), one-half glass of wine (5 oz), or one shot of hard liquor (1 oz).  Do not use any products that contain nicotine or tobacco, such as cigarettes and e-cigarettes. If you need help quitting, ask your health care provider. Summary  Having a healthy lifestyle and getting preventive care can help to protect your health and wellness after age 86.  Screening and testing are the best way to find a health problem early and help you avoid having a fall. Early diagnosis and treatment give you the best chance for managing medical conditions that are more common for people who are older than age 51.  Falls are a major cause of broken bones and head injuries in people who are older than age 110. Take precautions to prevent a fall at home.  Work with your health care provider to learn what changes you can make to improve your health and wellness and to prevent falls. This information is not intended to replace advice given to you by your health care provider. Make sure you discuss any questions you have with your health care provider. Document Released: 10/14/2017 Document Revised: 03/24/2019 Document Reviewed: 10/14/2017 Elsevier Patient Education  2020 [employer].

## 2019-10-15 LAB — COMPREHENSIVE METABOLIC PANEL
AG Ratio: 1.8 (calc) (ref 1.0–2.5)
ALT: 17 U/L (ref 6–29)
AST: 21 U/L (ref 10–35)
Albumin: 4.4 g/dL (ref 3.6–5.1)
Alkaline phosphatase (APISO): 41 U/L (ref 37–153)
BUN: 18 mg/dL (ref 7–25)
CO2: 26 mmol/L (ref 20–32)
Calcium: 9.8 mg/dL (ref 8.6–10.4)
Chloride: 103 mmol/L (ref 98–110)
Creat: 0.73 mg/dL (ref 0.60–0.93)
Globulin: 2.5 g/dL (calc) (ref 1.9–3.7)
Glucose, Bld: 87 mg/dL (ref 65–99)
Potassium: 4.8 mmol/L (ref 3.5–5.3)
Sodium: 140 mmol/L (ref 135–146)
Total Bilirubin: 0.4 mg/dL (ref 0.2–1.2)
Total Protein: 6.9 g/dL (ref 6.1–8.1)

## 2019-10-15 LAB — LIPID PANEL
Cholesterol: 211 mg/dL — ABNORMAL HIGH (ref <200)
HDL: 82 mg/dL (ref >=50)
LDL Cholesterol (Calc): 110 mg/dL (calc) — ABNORMAL HIGH
Non-HDL Cholesterol (Calc): 129 mg/dL (calc) (ref <130)
Total CHOL/HDL Ratio: 2.6 (calc) (ref <5.0)
Triglycerides: 99 mg/dL (ref <150)

## 2019-10-15 LAB — LDL CHOLESTEROL, DIRECT: Direct LDL: 104 mg/dL — ABNORMAL HIGH (ref <100)

## 2019-10-15 LAB — TSH: TSH: 0.44 mIU/L (ref 0.40–4.50)

## 2019-10-16 NOTE — Assessment & Plan Note (Signed)
LDL and triglyceride are at goal on  Pravastatin.  10 yr risk using FRC is now 8%.    And LFTS are normal. No change today Lab Results  Component Value Date   CHOL 211 (H) 10/14/2019   HDL 82 10/14/2019   LDLCALC 110 (H) 10/14/2019   LDLDIRECT 104 (H) 10/14/2019   TRIG 99 10/14/2019   CHOLHDL 2.6 10/14/2019   Lab Results  Component Value Date   ALT 17 10/14/2019   AST 21 10/14/2019   ALKPHOS 45 03/10/2019   BILITOT 0.4 10/14/2019

## 2019-10-16 NOTE — Assessment & Plan Note (Signed)

## 2019-10-16 NOTE — Assessment & Plan Note (Signed)
Well controlled on current regimen. Renal function stable, no changes today.  Lab Results  Component Value Date   CREATININE 0.73 10/14/2019   Lab Results  Component Value Date   NA 140 10/14/2019   K 4.8 10/14/2019   CL 103 10/14/2019   CO2 26 10/14/2019

## 2019-10-24 ENCOUNTER — Encounter: Payer: Self-pay | Admitting: Internal Medicine

## 2019-12-19 ENCOUNTER — Ambulatory Visit
Admission: RE | Admit: 2019-12-19 | Discharge: 2019-12-19 | Disposition: A | Discharge status: Home / Self Care | Payer: Medicare Other | Source: Ambulatory Visit | Attending: Internal Medicine | Admitting: Internal Medicine

## 2019-12-19 DIAGNOSIS — M818 Other osteoporosis without current pathological fracture: Secondary | ICD-10-CM | POA: Diagnosis not present

## 2019-12-19 DIAGNOSIS — Z780 Asymptomatic menopausal state: Secondary | ICD-10-CM | POA: Insufficient documentation

## 2020-01-07 DIAGNOSIS — Z23 Encounter for immunization: Secondary | ICD-10-CM | POA: Diagnosis not present

## 2020-01-28 DIAGNOSIS — Z23 Encounter for immunization: Secondary | ICD-10-CM | POA: Diagnosis not present

## 2020-02-10 MED ORDER — PRAVASTATIN SODIUM 40 MG PO TABS: 40.0000 mg | ORAL_TABLET | Freq: Every day | ORAL | 1 refills | Status: DC

## 2020-02-22 ENCOUNTER — Other Ambulatory Visit: Payer: Self-pay

## 2020-02-22 ENCOUNTER — Encounter: Payer: Self-pay | Admitting: Medical Oncology

## 2020-02-22 ENCOUNTER — Telehealth: Payer: Self-pay | Admitting: Internal Medicine

## 2020-02-22 ENCOUNTER — Emergency Department
Admission: EM | Admit: 2020-02-22 | Discharge: 2020-02-22 | Disposition: A | Discharge status: Home / Self Care | Payer: Medicare Other | Attending: Emergency Medicine | Admitting: Emergency Medicine

## 2020-02-22 DIAGNOSIS — Z79899 Other long term (current) drug therapy: Secondary | ICD-10-CM | POA: Diagnosis not present

## 2020-02-22 DIAGNOSIS — R55 Syncope and collapse: Secondary | ICD-10-CM

## 2020-02-22 DIAGNOSIS — I10 Essential (primary) hypertension: Secondary | ICD-10-CM | POA: Insufficient documentation

## 2020-02-22 HISTORY — DX: Essential (primary) hypertension: I10

## 2020-02-22 HISTORY — DX: Pure hypercholesterolemia, unspecified: E78.00

## 2020-02-22 LAB — CBC
HCT: 41.1 % (ref 36.0–46.0)
Hemoglobin: 13.7 g/dL (ref 12.0–15.0)
MCH: 33.3 pg (ref 26.0–34.0)
MCHC: 33.3 g/dL (ref 30.0–36.0)
MCV: 99.8 fL (ref 80.0–100.0)
Platelets: 159 10*3/uL (ref 150–400)
RBC: 4.12 MIL/uL (ref 3.87–5.11)
RDW: 12 % (ref 11.5–15.5)
WBC: 5.4 10*3/uL (ref 4.0–10.5)
nRBC: 0 % (ref 0.0–0.2)

## 2020-02-22 LAB — COMPREHENSIVE METABOLIC PANEL
ALT: 23 U/L (ref 0–44)
AST: 27 U/L (ref 15–41)
Albumin: 4.3 g/dL (ref 3.5–5.0)
Alkaline Phosphatase: 38 U/L (ref 38–126)
Anion gap: 7 (ref 5–15)
BUN: 16 mg/dL (ref 8–23)
CO2: 28 mmol/L (ref 22–32)
Calcium: 9.3 mg/dL (ref 8.9–10.3)
Chloride: 104 mmol/L (ref 98–111)
Creatinine, Ser: 0.5 mg/dL (ref 0.44–1.00)
GFR calc Af Amer: >60 mL/min (ref >60)
GFR calc non Af Amer: >60 mL/min (ref >60)
Glucose, Bld: 94 mg/dL (ref 70–99)
Potassium: 4.2 mmol/L (ref 3.5–5.1)
Sodium: 139 mmol/L (ref 135–145)
Total Bilirubin: 0.8 mg/dL (ref 0.3–1.2)
Total Protein: 7.1 g/dL (ref 6.5–8.1)

## 2020-02-22 LAB — TROPONIN I (HIGH SENSITIVITY): Troponin I (High Sensitivity): 5 ng/L (ref <18)

## 2020-02-22 NOTE — Discharge Instructions (Addendum)
Please call the number provided for cardiology today to arrange a follow-up appointment since possible to discuss possible Holter monitoring.

## 2020-02-22 NOTE — ED Triage Notes (Signed)
Pt from home via pov- pt reports around 0630 this am she got out of bed and into the shower, while in the shower had an episode where she felt like she was going to pass out so she sat down in shower, LOC for about 45 secs per pts husband. Pt A/o x 4, denies any complaints at this time.

## 2020-02-22 NOTE — Telephone Encounter (Signed)
SENT TO ACCESS NURSE Pt called and said that she passed out in the shower this morning for about 60 seconds Pt stated that her hearing went dull and she started seeing bright lights  Pt BP was 120/70

## 2020-02-22 NOTE — ED Provider Notes (Signed)
Shannon Hickman  Time seen: 9:57 AM  I have reviewed the triage vital signs and the nursing notes.   HISTORY  Chief Complaint Loss of Consciousness   HPI Shannon Hickman is a 74 y.o. female with a past medical history of hypertension, hyperlipidemia presents to the emergency department after a syncope versus near syncopal event.  According to the patient she got up around 630 this morning, got into the shower.  States after a minute or 2 in the shower she began feeling hot, states her hearing was muffled in her vision that started to turn into a bright light for patient.  Patient states she is able to sit down in the shower and called for her husband.  She states her husband tried to talk to her but she was unable to respond this lasted approximately 45 seconds until the patient became responsive once again.  Is not clear if patient actually lost consciousness.  Patient denies any history of syncope in the past.  Denies any chest pain or palpitations prior to the event.  Largely negative review of systems including abdominal pain nausea vomiting diarrhea dysuria recent fever cough congestion or shortness of breath.   Past Medical History:  Diagnosis Date  . Chicken pox   . Diverticulitis   . Heart murmur   . High cholesterol   . Hypertension   . 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 Migraine   . Urinary tract infection     Patient Active Problem List   Diagnosis Date Noted  . Allergic rhinitis 05/01/2018  . Encounter for preventive health examination 09/05/2017  . S/P lateral meniscus repair of left knee 09/05/2017  . Chronic pain of right knee 09/05/2017  . OA (osteoarthritis) of knee 09/02/2016  . Postmenopausal estrogen deficiency 09/01/2016  . Hyperlipidemia 09/01/2016  . Osteopenia 08/31/2015  . Chronic vulvitis 08/27/2014  . Essential hypertension 07/07/2013    Past Surgical History:  Procedure Laterality Date  .  APPENDECTOMY  1998  . BUNIONECTOMY Left 01/08/2017   Procedure: Arthrodesis Metatarsalphalangeal joint MTPJ Left;  Surgeon: Albertine Patricia, DPM;  Location: [facility];  Service: Podiatry;  Laterality: Left;  . HISTOPLASMOSIS    . KNEE ARTHROSCOPY Left 08/02/2015   Procedure: ARTHROSCOPY KNEE, PARTIAL MEDIAL MENISCECTOMY;  Surgeon: Hessie Knows, MD;  Location: [facility];  Service: Orthopedics;  Laterality: Left;  . PARTIAL COLECTOMY     diverticular rupture, Rochel Brome     Prior to Admission medications   Medication Sig Start Date End Date Taking? Authorizing Provider  acetaminophen (TYLENOL) 325 MG tablet Take 650 mg by mouth every 6 (six) hours as needed.    [provider]  amLODipine (NORVASC) 10 MG tablet Take 1 tablet (10 mg total) by mouth daily. 09/05/19   Crecencio Mc, MD  aspirin EC 81 MG tablet Take 81 mg by mouth daily.    [provider]  cetirizine (ZYRTEC) 10 MG tablet Take 10 mg by mouth daily.     [provider]  Coenzyme Q10 (COQ10) 100 MG CAPS Take 1 capsule by mouth daily.    [provider]  Multiple Vitamins-Minerals (CENTRUM SILVER ULTRA WOMENS PO) Take 1 tablet by mouth daily.    [provider]  pravastatin (PRAVACHOL) 40 MG tablet Take 1 tablet (40 mg total) by mouth daily. 02/10/20   Crecencio Mc, MD    Allergies  Allergen Reactions  . Minocycline     blisterinr  oral ulcers    Family History  Problem Relation Age of Onset  . Heart disease Father 63  . Diabetes Father 18  . Stomach cancer Maternal Grandfather   . Cancer Neg Hx     Social History Social History   Tobacco Use  . Smoking status: Never Smoker  . Smokeless tobacco: Never Used  Substance Use Topics  . Alcohol use: No  . Drug use: Not on file    Review of Systems Constitutional: Negative for fever. Cardiovascular: Negative for chest pain. Respiratory: Negative for shortness of breath. Gastrointestinal: Negative for abdominal  pain, vomiting and diarrhea. Genitourinary: Negative for urinary compaints Musculoskeletal: Negative for musculoskeletal complaints Skin: Negative for skin complaints  Neurological: Negative for headache All other ROS negative  ____________________________________________   PHYSICAL EXAM:  VITAL SIGNS: ED Triage Vitals [02/22/20 0949]  Enc Vitals Group     BP (!) 151/72     Pulse Rate 86     Resp 16     Temp 98 F (36.7 C)     Temp Source Oral     SpO2 100 %     Weight 135 lb (61.2 kg)     Height 5\' 4"  (1.626 m)   Constitutional: Alert and oriented. Well appearing and in no distress. Eyes: Normal exam ENT      Head: Normocephalic and atraumatic.      Mouth/Throat: Mucous membranes are moist. Cardiovascular: Normal rate, regular rhythm Respiratory: Normal respiratory effort without tachypnea nor retractions. Breath sounds are clear Gastrointestinal: Soft and nontender. No distention.   Musculoskeletal: Nontender with normal range of motion in all extremities.  Neurologic:  Normal speech and language. No gross focal neurologic deficits Skin:  Skin is warm, dry and intact.  Psychiatric: Mood and affect are normal.  ____________________________________________    EKG  EKG viewed and interpreted by myself shows a normal sinus rhythm at 76 bpm with a narrow QRS, normal axis, normal intervals, no concerning ST changes   INITIAL IMPRESSION / ASSESSMENT AND PLAN / ED COURSE  Pertinent labs & imaging results that were available during my care of the patient were reviewed by me and considered in my medical decision making (see chart for details).   Presents to the emergency department for syncope versus near syncope.  Overall the patient appears well, no distress.  No chest pain or palpitations at any time.  Event occurred in a hot shower per patient shortly after awakening.  We will check labs including cardiac enzyme.  We will monitor on cardiac monitoring.  Patient is EKG is  reassuring.  No concerning findings on physical exam.  Lab work is reassuring.  EKG is reassuring.  No findings on cardiac monitoring.  We will discharge patient home with cardiology follow-up.  Patient agreeable to plan of care.  Shannon Hickman was evaluated in Emergency Department on 02/22/2020 for the symptoms described in the history of present illness. She was evaluated in the context of the global COVID-19 pandemic, which necessitated consideration that the patient might be at risk for infection with the SARS-CoV-2 virus that causes COVID-19. Institutional protocols and algorithms that pertain to the evaluation of patients at risk for COVID-19 are in a state of rapid change based on information released by regulatory bodies including the CDC and federal and state organizations. These policies and algorithms were followed during the patient's care in the ED.  ____________________________________________   FINAL CLINICAL IMPRESSION(S) / ED DIAGNOSES  Syncope   Harvest Dark, MD 02/22/20  [id number]  

## 2020-02-23 DIAGNOSIS — R55 Syncope and collapse: Secondary | ICD-10-CM | POA: Diagnosis not present

## 2020-02-23 DIAGNOSIS — R010 Benign and innocent cardiac murmurs: Secondary | ICD-10-CM | POA: Diagnosis not present

## 2020-02-23 DIAGNOSIS — E782 Mixed hyperlipidemia: Secondary | ICD-10-CM | POA: Diagnosis not present

## 2020-02-23 DIAGNOSIS — I10 Essential (primary) hypertension: Secondary | ICD-10-CM | POA: Diagnosis not present

## 2020-02-23 NOTE — Telephone Encounter (Signed)
Patient Name: Shannon Hickman Gender: Female DOB: 26-Aug-1946 Age: 74 Y 72 M 26 D Return Phone Number: [phone] (Primary), [phone] (Secondary) Address[place] [address] Client [place] Client Site [facility] - Day Physician Deborra Medina - MD Contact Type Call Who Is Calling Patient / Member / Family / Caregiver Call Type Triage / Clinical Relationship To Patient Self Return Phone Number [phone] (Primary) Chief Complaint FAINTING or Speculator Reason for Call Symptomatic / Request for Health Information Initial Comment Passed out this morning in the shower for 60 seconds, hearing went dull and vision faded into a bright light. [facility] Translation No Nurse Assessment Nurse: Gildardo Pounds, RN, Amy Date/Time Eilene Ghazi Time): 02/22/2020 8:57:31 AM Confirm and document reason for call. If symptomatic, describe symptoms. ---Caller states she passed out this morning around 6:30AM in the shower for 60 seconds. Her hearing went dull and her vision faded into a bright light. She did not fall, she sat down on the shelf in the shower. Her husband came in, saw she was sitting down & asked her a question. She did not respond for about a minute. No dizziness or lightheadedness now. Has the patient had close contact with a person known or suspected to have the novel coronavirus illness OR traveled / lives in area with major community spread (including international travel) in the last 14 days from the onset of symptoms? * If Asymptomatic, screen for exposure and travel within the last 14 days. ---No Does the patient have any new or worsening symptoms? ---Yes Will a triage be completed? ---Yes Related visit to physician within the last 2 weeks? ---No Does the PT have any chronic conditions? (i.e. diabetes, asthma, this includes High risk factors for pregnancy,  etc.) ---No Is this a behavioral health or substance abuse call? ---NoPLEASE NOTE: All timestamps contained within this report are represented as [place] Standard Time. CONFIDENTIALTY NOTICE: This fax transmission is intended only for the addressee. It contains information that is legally privileged, confidential or otherwise protected from use or disclosure. If you are not the intended recipient, you are strictly prohibited from reviewing, disclosing, copying using or disseminating any of this information or taking any action in reliance on or regarding this information. If you have received this fax in error, please notify us immediately by telephone so that we can arrange for its return to [place]. Phone: [phone], Toll-Free: [phone], Fax: [phone] Page: 2 of 2 Call Id: [id number] Guidelines Guideline Title Affirmed Question Affirmed Notes Nurse Date/Time Eilene Ghazi Time) Fainting [1] Age > 50 years AND [2] now alert and feels fine Lovelace, RN, Amy 02/22/2020 8:58:55 AM Disp. Time Eilene Ghazi Time) Disposition Final User 02/22/2020 8:56:13 AM Send to Urgent Queue Donato Heinz 02/22/2020 9:01:29 AM Go to ED Now (or PCP triage) Yes Lovelace, RN, Amy Caller Disagree/Comply Comply Caller Understands Yes PreDisposition InappropriateToAsk Care Advice Given Per Guideline GO TO ED NOW (OR PCP TRIAGE): ANOTHER ADULT SHOULD DRIVE: * It is better and safer if another adult drives instead of you. CALL BACK IF: * You become worse. CARE ADVICE given per Fainting (Adult) guideline. * IF NO PCP (PRIMARY CARE PROVIDER) SECOND-LEVEL TRIAGE: You need to be seen within the next hour. Go to the [facility] at _____________ [place] as soon as you can.

## 2020-02-29 DIAGNOSIS — R55 Syncope and collapse: Secondary | ICD-10-CM | POA: Diagnosis not present

## 2020-03-01 DIAGNOSIS — R55 Syncope and collapse: Secondary | ICD-10-CM | POA: Diagnosis not present

## 2020-03-02 MED ORDER — AMLODIPINE BESYLATE 10 MG PO TABS: 10.0000 mg | ORAL_TABLET | Freq: Every day | ORAL | 1 refills | Status: DC

## 2020-03-06 DIAGNOSIS — I351 Nonrheumatic aortic (valve) insufficiency: Secondary | ICD-10-CM | POA: Diagnosis not present

## 2020-03-06 DIAGNOSIS — R55 Syncope and collapse: Secondary | ICD-10-CM | POA: Diagnosis not present

## 2020-03-06 DIAGNOSIS — I340 Nonrheumatic mitral (valve) insufficiency: Secondary | ICD-10-CM | POA: Diagnosis not present

## 2020-03-06 DIAGNOSIS — I10 Essential (primary) hypertension: Secondary | ICD-10-CM | POA: Diagnosis not present

## 2020-03-08 DIAGNOSIS — R55 Syncope and collapse: Secondary | ICD-10-CM | POA: Diagnosis not present

## 2020-03-13 DIAGNOSIS — I351 Nonrheumatic aortic (valve) insufficiency: Secondary | ICD-10-CM | POA: Diagnosis not present

## 2020-03-13 DIAGNOSIS — I10 Essential (primary) hypertension: Secondary | ICD-10-CM | POA: Diagnosis not present

## 2020-03-13 DIAGNOSIS — R55 Syncope and collapse: Secondary | ICD-10-CM | POA: Diagnosis not present

## 2020-03-13 DIAGNOSIS — E785 Hyperlipidemia, unspecified: Secondary | ICD-10-CM | POA: Diagnosis not present

## 2020-05-06 DIAGNOSIS — R300 Dysuria: Secondary | ICD-10-CM | POA: Diagnosis not present

## 2020-05-06 DIAGNOSIS — N390 Urinary tract infection, site not specified: Secondary | ICD-10-CM | POA: Diagnosis not present

## 2020-07-11 ENCOUNTER — Encounter: Payer: Self-pay | Admitting: Internal Medicine

## 2020-07-11 ENCOUNTER — Telehealth (INDEPENDENT_AMBULATORY_CARE_PROVIDER_SITE_OTHER): Payer: Medicare Other | Admitting: Internal Medicine

## 2020-07-11 DIAGNOSIS — M810 Age-related osteoporosis without current pathological fracture: Secondary | ICD-10-CM | POA: Diagnosis not present

## 2020-07-11 NOTE — Progress Notes (Signed)
Virtual Visit via [place]  This visit type was conducted due to national recommendations for restrictions regarding the COVID-19 pandemic (e.g. social distancing).  This format is felt to be most appropriate for this patient at this time.  All issues noted in this document were discussed and addressed.  No physical exam was performed (except for noted visual exam findings with Video Visits).   I connected with@ on 07/11/20 at  4:30 PM EDT by a video enabled telemedicine application and verified that I am speaking with the correct person using two identifiers. Location patient: home Location provider: work or home office Persons participating in the virtual visit: patient, provider  I discussed the limitations, risks, security and privacy concerns of performing an evaluation and management service by telephone and the availability of in person appointments. I also discussed with the patient that there may be a patient responsible charge related to this service. The patient expressed understanding and agreed to proceed.  Reason for visit: follow upon DEXA results   HPI:  74 yr old female with no history of fractures presents for discussion of recent DEXA results. Comparison of previous DEXA and current DEXA done with patient .  She has progressed to osteoporosis.  No history of steroid use,  Weight loss  Bariatric surgery.  Calcium and VitaminD intake reviewed.    The risks and benefits of various treatments was discussed, incluidng bisphosphonate, SERMs (Evsta) and Prolia  Were discussed at length   ROS: See pertinent positives and negatives per HPI. Were   Past Medical History:  Diagnosis Date  . Chicken pox   . Diverticulitis   . Heart murmur   . High cholesterol   . Hypertension   . 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 Migraine   . Urinary tract infection     Past Surgical History:  Procedure Laterality Date  . APPENDECTOMY  1998  . BUNIONECTOMY Left 01/08/2017    Procedure: Arthrodesis Metatarsalphalangeal joint MTPJ Left;  Surgeon: Albertine Patricia, DPM;  Location: [place] [facility];  Service: Podiatry;  Laterality: Left;  . HISTOPLASMOSIS    . KNEE ARTHROSCOPY Left 08/02/2015   Procedure: ARTHROSCOPY KNEE, PARTIAL MEDIAL MENISCECTOMY;  Surgeon: Hessie Knows, MD;  Location: [facility];  Service: Orthopedics;  Laterality: Left;  . PARTIAL COLECTOMY     diverticular rupture, Rochel Brome     Family History  Problem Relation Age of Onset  . Heart disease Father 10  . Diabetes Father 73  . Stomach cancer Maternal Grandfather   . Cancer Neg Hx     SOCIAL HX:  reports that she has never smoked. She has never used smokeless tobacco. She reports that she does not drink alcohol. No history on file for drug use.   Current Outpatient Medications:  .  acetaminophen (TYLENOL) 325 MG tablet, Take 650 mg by mouth every 6 (six) hours as needed., Disp: , Rfl:  .  amLODipine (NORVASC) 10 MG tablet, Take 1 tablet (10 mg total) by mouth daily., Disp: 90 tablet, Rfl: 1 .  aspirin EC 81 MG tablet, Take 81 mg by mouth daily., Disp: , Rfl:  .  cetirizine (ZYRTEC) 10 MG tablet, Take 10 mg by mouth daily. , Disp: , Rfl:  .  Coenzyme Q10 (COQ10) 100 MG CAPS, Take 1 capsule by mouth daily., Disp: , Rfl:  .  Multiple Vitamins-Minerals (CENTRUM SILVER ULTRA WOMENS PO), Take 1 tablet by mouth daily., Disp: , Rfl:  .  pravastatin (PRAVACHOL) 40 MG tablet, Take 1  tablet (40 mg total) by mouth daily. (Patient taking differently: Take 40 mg by mouth daily. ), Disp: 90 tablet, Rfl: 1  EXAM:  VITALS per patient if applicable:  GENERAL: alert, oriented, appears well and in no acute distress  HEENT: atraumatic, conjunttiva clear, no obvious abnormalities on inspection of external nose and ears  NECK: normal movements of the head and neck  LUNGS: on inspection no signs of respiratory distress, breathing rate appears normal, no obvious gross SOB, gasping or wheezing  CV: no  obvious cyanosis  MS: moves all visible extremities without noticeable abnormality  PSYCH/NEURO: pleasant and cooperative, no obvious depression or anxiety, speech and thought processing grossly intact  ASSESSMENT AND PLAN:  Discussed the following assessment and plan:  Age-related osteoporosis without current pathological fracture  Osteoporosis Discussed treatment options,  Calcium and Vit d requirements.  Evista advised given her age and lack of significant combordities .  She remains contemplative/    I discussed the assessment and treatment plan with the patient. The patient was provided an opportunity to ask questions and all were answered. The patient agreed with the plan and demonstrated an understanding of the instructions.   The patient was advised to call back or seek an in-person evaluation if the symptoms worsen or if the condition fails to improve as anticipated.  I provided 20 minutes of non-face-to-face time during this encounter.   Crecencio Mc, MD

## 2020-07-11 NOTE — Patient Instructions (Signed)
Raloxifene is generic for Evista

## 2020-07-14 DIAGNOSIS — M810 Age-related osteoporosis without current pathological fracture: Secondary | ICD-10-CM | POA: Insufficient documentation

## 2020-07-14 NOTE — Assessment & Plan Note (Addendum)
Discussed treatment options,  Calcium and Vit d requirements.  Evista advised given her age and lack of significant combordities .  She remains contemplative/

## 2020-07-15 MED ORDER — RALOXIFENE HCL 60 MG PO TABS: 60.0000 mg | ORAL_TABLET | Freq: Every day | ORAL | 5 refills | Status: DC

## 2020-08-02 ENCOUNTER — Ambulatory Visit (INDEPENDENT_AMBULATORY_CARE_PROVIDER_SITE_OTHER): Payer: Medicare Other

## 2020-08-02 VITALS — Ht 64.0 in | Wt 135.0 lb

## 2020-08-02 DIAGNOSIS — Z0000 Encounter for general adult medical examination without abnormal findings: Secondary | ICD-10-CM

## 2020-08-02 NOTE — Progress Notes (Addendum)
Subjective:   Shannon Hickman is a 74 y.o. female who presents for Medicare Annual (Subsequent) preventive examination.  Review of Systems    No ROS.  Medicare Wellness Virtual Visit.   Cardiac Risk Factors include: advanced age (>32men, >96 women);hypertension     Objective:    Today's Vitals   08/02/20 0833  Weight: 135 lb (61.2 kg)  Height: 5\' 4"  (1.626 m)   Body mass index is 23.17 kg/m.  Advanced Directives 08/02/2020 02/22/2020 08/02/2019 01/08/2017 07/31/2015  Does Patient Have a Medical Advance Directive? Yes No Yes Yes Yes  Type of Paramedic of [place];Living will - [place];Living will [place];Living will [place];Living will  Does patient want to make changes to medical advance directive? No - Patient declined - No - Patient declined - -  Copy of [place] in Chart? No - copy requested - No - copy requested - -    Current Medications (verified) Outpatient Encounter Medications as of 08/02/2020  Medication Sig   acetaminophen (TYLENOL) 325 MG tablet Take 650 mg by mouth every 6 (six) hours as needed.   amLODipine (NORVASC) 10 MG tablet Take 1 tablet (10 mg total) by mouth daily.   aspirin EC 81 MG tablet Take 81 mg by mouth daily.   cetirizine (ZYRTEC) 10 MG tablet Take 10 mg by mouth daily.    Coenzyme Q10 (COQ10) 100 MG CAPS Take 1 capsule by mouth daily.   Multiple Vitamins-Minerals (CENTRUM SILVER ULTRA WOMENS PO) Take 1 tablet by mouth daily.   pravastatin (PRAVACHOL) 40 MG tablet Take 1 tablet (40 mg total) by mouth daily. (Patient taking differently: Take 40 mg by mouth daily. )   raloxifene (EVISTA) 60 MG tablet Take 1 tablet (60 mg total) by mouth daily.   No facility-administered encounter medications on file as of 08/02/2020.    Allergies (verified) Minocycline   History: Past Medical History:  Diagnosis Date   Chicken pox    Diverticulitis     Heart murmur    High cholesterol    Hypertension    Inflammatory polyps of colon ([place])    Migraine    Urinary tract infection    Past Surgical History:  Procedure Laterality Date   APPENDECTOMY  1998   BUNIONECTOMY Left 01/08/2017   Procedure: Arthrodesis Metatarsalphalangeal joint MTPJ Left;  Surgeon: Albertine Patricia, DPM;  Location: [facility];  Service: Podiatry;  Laterality: Left;   HISTOPLASMOSIS     KNEE ARTHROSCOPY Left 08/02/2015   Procedure: ARTHROSCOPY KNEE, PARTIAL MEDIAL MENISCECTOMY;  Surgeon: Hessie Knows, MD;  Location: [facility];  Service: Orthopedics;  Laterality: Left;   PARTIAL COLECTOMY     diverticular rupture, Rochel Brome    Family History  Problem Relation Age of Onset   Heart disease Father 82   Diabetes Father 48   Stomach cancer Maternal Grandfather    Cancer Neg Hx    Social History   Socioeconomic History   Marital status: Married    Spouse name: Not on file   Number of children: Not on file   Years of education: Not on file   Highest education level: Not on file  Occupational History   Not on file  Tobacco Use   Smoking status: Never Smoker   Smokeless tobacco: Never Used  Substance and Sexual Activity   Alcohol use: No   Drug use: Not on file   Sexual activity: Yes  Other Topics  Concern   Not on file  Social History Narrative   Not on file   Social Determinants of Health   Financial Resource Strain: Low Risk    Difficulty of Paying Living Expenses: Not hard at all  Food Insecurity: No Food Insecurity   Worried About Charity fundraiser in the Last Year: Never true   [place] in the Last Year: Never true  Transportation Needs: No Transportation Needs   Lack of Transportation (Medical): No   Lack of Transportation (Non-Medical): No  Physical Activity:    Days of Exercise per Week: Not on file   Minutes of Exercise per Session: Not on file  Stress: No Stress Concern Present   Feeling of Stress : Not at all  Social  Connections: Socially Integrated   Frequency of Communication with Friends and Family: More than three times a week   Frequency of Social Gatherings with Friends and Family: More than three times a week   Attends Religious Services: More than 4 times per year   Active Member of [employer] or Organizations: Yes   Attends Music therapist: Not on file   Marital Status: Married    Tobacco Counseling Counseling given: Not Answered   Clinical Intake:  Pre-visit preparation completed: Yes        Diabetes: No  How often do you need to have someone help you when you read instructions, pamphlets, or other written materials from your doctor or pharmacy?: 1 - Never        Activities of Daily Living In your present state of health, do you have any difficulty performing the following activities: 08/02/2020  Hearing? N  Vision? N  Difficulty concentrating or making decisions? N  Walking or climbing stairs? N  Dressing or bathing? N  Doing errands, shopping? N  Preparing Food and eating ? N  Using the Toilet? N  In the past six months, have you accidently leaked urine? N  Do you have problems with loss of bowel control? N  Managing your Medications? N  Managing your Finances? N  Housekeeping or managing your Housekeeping? N  Some recent data might be hidden    Patient Care Team: Crecencio Mc, MD as PCP - General (Internal Medicine)  Indicate any recent Medical Services you may have received from other than [facility] providers in the past year (date may be approximate).     Assessment:   This is a routine wellness examination for Shannon Hickman.  I connected with Shannon Hickman today by telephone and verified that I am speaking with the correct person using two identifiers. Location patient: home Location provider: work Persons participating in the virtual visit: patient, Marine scientist.    I discussed the limitations, risks, security and privacy concerns of performing an evaluation and  management service by telephone and the availability of in person appointments. The patient expressed understanding and verbally consented to this telephonic visit.    Interactive audio and video telecommunications were attempted between this provider and patient, however failed, due to patient having technical difficulties OR patient did not have access to video capability.  We continued and completed visit with audio only.  Some vital signs may be absent or patient reported.   Hearing/Vision screen  Hearing Screening   125Hz  250Hz  500Hz  1000Hz  2000Hz  3000Hz  4000Hz  6000Hz  8000Hz   Right ear:           Left ear:           Comments: Patient is able  to hear conversational tones without difficulty.  No issues reported.  Vision Screening Comments: Followed by [facility] Wears corrective lenses Visual acuity not assessed, virtual visit.  They have seen their ophthalmologist in the last 12 months.     Dietary issues and exercise activities discussed: Current Exercise Habits: Home exercise routine, Intensity: Mild  Goals      Maintain Healthy Lifestyle     Stay active Stay hydrated Healthy diet       Depression Screen PHQ 2/9 Scores 08/02/2020 08/02/2019 09/06/2018 09/02/2017 09/01/2016 08/31/2015 08/25/2014  PHQ - 2 Score 0 0 0 0 0 0 1  PHQ- 9 Score - - 0 1 - - -    Fall Risk Fall Risk  08/02/2020 07/11/2020 08/02/2019 09/06/2018 09/02/2017  Falls in the past year? (No Data) 1 0 No No  Comment None since previously reported in the last 30 days - - - -  Number falls in past yr: - 0 - - -  Injury with Fall? - 0 - - -  Follow up Falls evaluation completed Falls evaluation completed - - -   Handrails in use when climbing stairs? Yes  Home free of loose throw rugs in walkways, pet beds, electrical cords, etc? Yes  Adequate lighting in your home to reduce risk of falls? Yes   ASSISTIVE DEVICES UTILIZED TO PREVENT FALLS: Life alert? No  Use of a cane, walker or w/c? No  Grab bars in the  bathroom? Yes  Shower chair or bench in shower? Yes  Elevated toilet seat or a handicapped toilet? Yes   TIMED UP AND GO:  Was the test performed? No . Virtual visit.   Cognitive Function:   Patient is alert and oriented x3.  Enjoys online brain challenging games.  Manages home finances.    6CIT Screen 08/02/2019  What Year? 0 points  What month? 0 points  What time? 0 points  Count back from 20 0 points  Months in reverse 0 points  Repeat phrase 0 points  Total Score 0    Immunizations Immunization History  Administered Date(s) Administered   Influenza Split 09/15/2012, 09/28/2014, 08/26/2016   Influenza, High Dose Seasonal PF 09/01/2017, 08/18/2018, 07/28/2019   Influenza-Unspecified 08/15/2013, 09/18/2014, 08/28/2015, 08/27/2016, 09/01/2017   PFIZER SARS-COV-2 Vaccination 01/07/2020, 01/28/2020   Pneumococcal Conjugate-13 08/25/2014   Pneumococcal Polysaccharide-23 03/16/2013, 07/28/2019   Tdap 01/16/2011   Zoster 01/16/2011    Health Maintenance Health Maintenance  Topic Date Due   INFLUENZA VACCINE  11/02/2020 (Originally 07/15/2020)   MAMMOGRAM  08/14/2020   TETANUS/TDAP  01/16/2021   COLONOSCOPY  06/20/2022   DEXA SCAN  Completed   COVID-19 Vaccine  Completed   Hepatitis C Screening  Completed   PNA vac Low Risk Adult  Completed   Influenza vaccine- out of stock  Dental Screening: Recommended annual dental exams for proper oral hygiene. Visits every 6 months.   Community Resource Referral / Chronic Care Management: CRR required this visit?  No   CCM required this visit?  No      Plan:   Keep all routine maintenance appointments.   I have personally reviewed and noted the following in the patient's chart:   Medical and social history Use of alcohol, tobacco or illicit drugs  Current medications and supplements Functional ability and status Nutritional status Physical activity Advanced directives List of other physicians Hospitalizations,  surgeries, and ER visits in previous 12 months Vitals Screenings to include cognitive, depression, and falls Referrals and appointments  In  addition, I have reviewed and discussed with patient certain preventive protocols, quality metrics, and best practice recommendations. A written personalized care plan for preventive services as well as general preventive health recommendations were provided to patient via mychart.     OBrien-Blaney, Javien Tesch L, LPN   [id number]/[id number]     I have reviewed the above information and agree with above.   Deborra Medina, MD

## 2020-08-02 NOTE — Patient Instructions (Addendum)
Shannon Hickman , Thank you for taking time to come for your Medicare Wellness Visit. I appreciate your ongoing commitment to your health goals. Please review the following plan we discussed and let me know if I can assist you in the future.   These are the goals we discussed: Goals    . Maintain Healthy Lifestyle     Stay active Stay hydrated Healthy diet       This is a list of the screening recommended for you and due dates:  Health Maintenance  Topic Date Due  . Flu Shot  11/02/2020*  . Mammogram  08/14/2020  . Tetanus Vaccine  01/16/2021  . Colon Cancer Screening  06/20/2022  . DEXA scan (bone density measurement)  Completed  . COVID-19 Vaccine  Completed  .  Hepatitis C: One time screening is recommended by Center for Disease Control  (CDC) for  adults born from [birth year] through 1964.   Completed  . Pneumonia vaccines  Completed  *Topic was postponed. The date shown is not the original due date.    Immunizations Immunization History  Administered Date(s) Administered  . Influenza Split 09/15/2012, 09/28/2014, 08/26/2016  . Influenza, High Dose Seasonal PF 09/01/2017, 08/18/2018, 07/28/2019  . Influenza-Unspecified 08/15/2013, 09/18/2014, 08/28/2015, 08/27/2016, 09/01/2017  . PFIZER SARS-COV-2 Vaccination 01/07/2020, 01/28/2020  . Pneumococcal Conjugate-13 08/25/2014  . Pneumococcal Polysaccharide-23 03/16/2013, 07/28/2019  . Tdap 01/16/2011  . Zoster 01/16/2011   Advanced directives: End of life planning; Advance aging; Advanced directives discussed.  Copy of current HCPOA/Living Will requested.    Conditions/risks identified: none new  Follow up in one year for your annual wellness visit.   Preventive Care 48 Years and Older, Female Preventive care refers to lifestyle choices and visits with your health care provider that can promote health and wellness. What does preventive care include?  A yearly physical exam. This is also called an annual well check.  Dental  exams once or twice a year.  Routine eye exams. Ask your health care provider how often you should have your eyes checked.  Personal lifestyle choices, including:  Daily care of your teeth and gums.  Regular physical activity.  Eating a healthy diet.  Avoiding tobacco and drug use.  Limiting alcohol use.  Practicing safe sex.  Taking low-dose aspirin every day.  Taking vitamin and mineral supplements as recommended by your health care provider. What happens during an annual well check? The services and screenings done by your health care provider during your annual well check will depend on your age, overall health, lifestyle risk factors, and family history of disease. Counseling  Your health care provider may ask you questions about your:  Alcohol use.  Tobacco use.  Drug use.  Emotional well-being.  Home and relationship well-being.  Sexual activity.  Eating habits.  History of falls.  Memory and ability to understand (cognition).  Work and work Statistician.  Reproductive health. Screening  You may have the following tests or measurements:  Height, weight, and BMI.  Blood pressure.  Lipid and cholesterol levels. These may be checked every 5 years, or more frequently if you are over 16 years old.  Skin check.  Lung cancer screening. You may have this screening every year starting at age 60 if you have a 30-pack-year history of smoking and currently smoke or have quit within the past 15 years.  Fecal occult blood test (FOBT) of the stool. You may have this test every year starting at age 65.  Flexible sigmoidoscopy or colonoscopy.  You may have a sigmoidoscopy every 5 years or a colonoscopy every 10 years starting at age 57.  Hepatitis C blood test.  Hepatitis B blood test.  Sexually transmitted disease (STD) testing.  Diabetes screening. This is done by checking your blood sugar (glucose) after you have not eaten for a while (fasting). You may  have this done every 1-3 years.  Bone density scan. This is done to screen for osteoporosis. You may have this done starting at age 52.  Mammogram. This may be done every 1-2 years. Talk to your health care provider about how often you should have regular mammograms. Talk with your health care provider about your test results, treatment options, and if necessary, the need for more tests. Vaccines  Your health care provider may recommend certain vaccines, such as:  Influenza vaccine. This is recommended every year.  Tetanus, diphtheria, and acellular pertussis (Tdap, Td) vaccine. You may need a Td booster every 10 years.  Zoster vaccine. You may need this after age 52.  Pneumococcal 13-valent conjugate (PCV13) vaccine. One dose is recommended after age 69.  Pneumococcal polysaccharide (PPSV23) vaccine. One dose is recommended after age 70. Talk to your health care provider about which screenings and vaccines you need and how often you need them. This information is not intended to replace advice given to you by your health care provider. Make sure you discuss any questions you have with your health care provider. Document Released: 12/28/2015 Document Revised: 08/20/2016 Document Reviewed: 10/02/2015 Elsevier Interactive Patient Education  2017 [place] Prevention in the Home Falls can cause injuries. They can happen to people of all ages. There are many things you can do to make your home safe and to help prevent falls. What can I do on the outside of my home?  Regularly fix the edges of walkways and driveways and fix any cracks.  Remove anything that might make you trip as you walk through a door, such as a raised step or threshold.  Trim any bushes or trees on the path to your home.  Use bright outdoor lighting.  Clear any walking paths of anything that might make someone trip, such as rocks or tools.  Regularly check to see if handrails are loose or broken. Make  sure that both sides of any steps have handrails.  Any raised decks and porches should have guardrails on the edges.  Have any leaves, snow, or ice cleared regularly.  Use sand or salt on walking paths during winter.  Clean up any spills in your garage right away. This includes oil or grease spills. What can I do in the bathroom?  Use night lights.  Install grab bars by the toilet and in the tub and shower. Do not use towel bars as grab bars.  Use non-skid mats or decals in the tub or shower.  If you need to sit down in the shower, use a plastic, non-slip stool.  Keep the floor dry. Clean up any water that spills on the floor as soon as it happens.  Remove soap buildup in the tub or shower regularly.  Attach bath mats securely with double-sided non-slip rug tape.  Do not have throw rugs and other things on the floor that can make you trip. What can I do in the bedroom?  Use night lights.  Make sure that you have a light by your bed that is easy to reach.  Do not use any sheets or blankets that are too big  for your bed. They should not hang down onto the floor.  Have a firm chair that has side arms. You can use this for support while you get dressed.  Do not have throw rugs and other things on the floor that can make you trip. What can I do in the kitchen?  Clean up any spills right away.  Avoid walking on wet floors.  Keep items that you use a lot in easy-to-reach places.  If you need to reach something above you, use a strong step stool that has a grab bar.  Keep electrical cords out of the way.  Do not use floor polish or wax that makes floors slippery. If you must use wax, use non-skid floor wax.  Do not have throw rugs and other things on the floor that can make you trip. What can I do with my stairs?  Do not leave any items on the stairs.  Make sure that there are handrails on both sides of the stairs and use them. Fix handrails that are broken or loose.  Make sure that handrails are as long as the stairways.  Check any carpeting to make sure that it is firmly attached to the stairs. Fix any carpet that is loose or worn.  Avoid having throw rugs at the top or bottom of the stairs. If you do have throw rugs, attach them to the floor with carpet tape.  Make sure that you have a light switch at the top of the stairs and the bottom of the stairs. If you do not have them, ask someone to add them for you. What else can I do to help prevent falls?  Wear shoes that:  Do not have high heels.  Have rubber bottoms.  Are comfortable and fit you well.  Are closed at the toe. Do not wear sandals.  If you use a stepladder:  Make sure that it is fully opened. Do not climb a closed stepladder.  Make sure that both sides of the stepladder are locked into place.  Ask someone to hold it for you, if possible.  Clearly mark and make sure that you can see:  Any grab bars or handrails.  First and last steps.  Where the edge of each step is.  Use tools that help you move around (mobility aids) if they are needed. These include:  Canes.  Walkers.  Scooters.  Crutches.  Turn on the lights when you go into a dark area. Replace any light bulbs as soon as they burn out.  Set up your furniture so you have a clear path. Avoid moving your furniture around.  If any of your floors are uneven, fix them.  If there are any pets around you, be aware of where they are.  Review your medicines with your doctor. Some medicines can make you feel dizzy. This can increase your chance of falling. Ask your doctor what other things that you can do to help prevent falls. This information is not intended to replace advice given to you by your health care provider. Make sure you discuss any questions you have with your health care provider. Document Released: 09/27/2009 Document Revised: 05/08/2016 Document Reviewed: 01/05/2015 Elsevier Interactive Patient  Education  2017 [employer].

## 2020-08-24 DIAGNOSIS — Z1231 Encounter for screening mammogram for malignant neoplasm of breast: Secondary | ICD-10-CM | POA: Diagnosis not present

## 2020-08-24 LAB — HM MAMMOGRAPHY

## 2020-09-04 ENCOUNTER — Other Ambulatory Visit: Payer: Self-pay

## 2020-09-04 MED ORDER — AMLODIPINE BESYLATE 10 MG PO TABS: 10.0000 mg | ORAL_TABLET | Freq: Every day | ORAL | 1 refills | Status: DC

## 2020-09-07 DIAGNOSIS — Z23 Encounter for immunization: Secondary | ICD-10-CM | POA: Diagnosis not present

## 2020-09-26 DIAGNOSIS — E782 Mixed hyperlipidemia: Secondary | ICD-10-CM | POA: Diagnosis not present

## 2020-09-26 DIAGNOSIS — I10 Essential (primary) hypertension: Secondary | ICD-10-CM | POA: Diagnosis not present

## 2020-09-27 LAB — HEPATIC FUNCTION PANEL
ALT: 20 (ref 7–35)
AST: 26 (ref 13–35)
Alkaline Phosphatase: 50 (ref 25–125)
Bilirubin, Total: 0.5

## 2020-09-27 LAB — BASIC METABOLIC PANEL
BUN: 21 (ref 4–21)
CO2: 26 — AB (ref 13–22)
Chloride: 105 (ref 99–108)
Creatinine: 0.7 (ref 0.5–1.1)
Potassium: 4.5 (ref 3.4–5.3)
Sodium: 140 (ref 137–147)

## 2020-09-27 LAB — LIPID PANEL
Cholesterol: 206 — AB (ref 0–200)
HDL: 85 — AB (ref 35–70)
LDL Cholesterol: 107
Triglycerides: 79 (ref 40–160)

## 2020-09-27 LAB — COMPREHENSIVE METABOLIC PANEL
Albumin: 4.6 (ref 3.5–5.0)
Calcium: 9.4 (ref 8.7–10.7)

## 2020-10-02 NOTE — Telephone Encounter (Signed)
Pt returned your call-appts avail this week

## 2020-10-09 ENCOUNTER — Telehealth: Payer: Medicare Other | Admitting: Internal Medicine

## 2020-10-15 ENCOUNTER — Other Ambulatory Visit: Payer: Self-pay

## 2020-10-15 ENCOUNTER — Telehealth (INDEPENDENT_AMBULATORY_CARE_PROVIDER_SITE_OTHER): Payer: Medicare Other | Admitting: Internal Medicine

## 2020-10-15 ENCOUNTER — Encounter: Payer: Self-pay | Admitting: Internal Medicine

## 2020-10-15 DIAGNOSIS — E782 Mixed hyperlipidemia: Secondary | ICD-10-CM | POA: Diagnosis not present

## 2020-10-15 DIAGNOSIS — M810 Age-related osteoporosis without current pathological fracture: Secondary | ICD-10-CM

## 2020-10-15 DIAGNOSIS — I10 Essential (primary) hypertension: Secondary | ICD-10-CM | POA: Diagnosis not present

## 2020-10-15 DIAGNOSIS — I351 Nonrheumatic aortic (valve) insufficiency: Secondary | ICD-10-CM | POA: Diagnosis not present

## 2020-10-15 NOTE — Assessment & Plan Note (Addendum)
Diagnosed By ECHO March 2021 ([facility] follow up on syncopal episode that occurred during a hot shower.

## 2020-10-15 NOTE — Progress Notes (Signed)
Virtual Visit via [facility]  This visit type was conducted due to national recommendations for restrictions regarding the COVID-19 pandemic (e.g. social distancing).  This format is felt to be most appropriate for this patient at this time.  All issues noted in this document were discussed and addressed.  No physical exam was performed (except for noted visual exam findings with Video Visits).   I connected with@ on 10/15/20 at  8:00 AM EDT by a video enabled telemedicine applicationand verified that I am speaking with the correct person using two identifiers. Location patient: home Location provider: work or home office Persons participating in the virtual visit: patient, provider  I discussed the limitations, risks, security and privacy concerns of performing an evaluation and management service by telephone and the availability of in person appointments. I also discussed with the patient that there may be a patient responsible charge related to this service. The patient expressed understanding and agreed to proceed.  Reason for visit: follow up on osteoporosis and hyperlipidemia   HPI:  Has been seeing cardiology Humphrey Rolls) since March 2021 admission for syncopal episode.  During cardiology follow up dose of pravastatin was increased from 40 mg to 80 mg  by Humphrey Rolls, and after recent labs Dr Laurelyn Sickle PA changed her medication to  80 mg atorvastatin based on an  increase in 10 yr risk from 15 to 18%.  (labs not available) Patient wants to switch cardiologists to Dr Saralyn Pilar (husband's cardiologist for 30 yrs)  Has been taking Evista since late July:  Having increased edema, cholesterol, hot flashes  Nocturnal leg cramps since then.   ROS: See pertinent positives and negatives per HPI.  Past Medical History:  Diagnosis Date  . Chicken pox   . Diverticulitis   . Heart murmur   . High cholesterol   . Hypertension   . Inflammatory polyps of colon ([place])   . Migraine   . Urinary tract infection      Past Surgical History:  Procedure Laterality Date  . APPENDECTOMY  1998  . BUNIONECTOMY Left 01/08/2017   Procedure: Arthrodesis Metatarsalphalangeal joint MTPJ Left;  Surgeon: Albertine Patricia, DPM;  Location: [facility];  Service: Podiatry;  Laterality: Left;  . HISTOPLASMOSIS    . KNEE ARTHROSCOPY Left 08/02/2015   Procedure: ARTHROSCOPY KNEE, PARTIAL MEDIAL MENISCECTOMY;  Surgeon: Hessie Knows, MD;  Location: [facility];  Service: Orthopedics;  Laterality: Left;  . PARTIAL COLECTOMY     diverticular rupture, Rochel Brome     Family History  Problem Relation Age of Onset  . Heart disease Father 92  . Diabetes Father 47  . Stomach cancer Maternal Grandfather   . Cancer Neg Hx     SOCIAL HX:  reports that she has never smoked. She has never used smokeless tobacco. She reports that she does not drink alcohol. No history on file for drug use.   Current Outpatient Medications:  .  acetaminophen (TYLENOL) 325 MG tablet, Take 650 mg by mouth every 6 (six) hours as needed., Disp: , Rfl:  .  amLODipine (NORVASC) 10 MG tablet, Take 1 tablet (10 mg total) by mouth daily., Disp: 90 tablet, Rfl: 1 .  aspirin EC 81 MG tablet, Take 81 mg by mouth daily., Disp: , Rfl:  .  cetirizine (ZYRTEC) 10 MG tablet, Take 10 mg by mouth daily. , Disp: , Rfl:  .  Coenzyme Q10 (COQ10) 100 MG CAPS, Take 1 capsule by mouth daily., Disp: , Rfl:  .  Multiple Vitamins-Minerals (CENTRUM SILVER  ULTRA WOMENS PO), Take 1 tablet by mouth daily., Disp: , Rfl:  .  pravastatin (PRAVACHOL) 40 MG tablet, Take 1 tablet (40 mg total) by mouth daily. (Patient taking differently: Take 40 mg by mouth daily. ), Disp: 90 tablet, Rfl: 1 .  raloxifene (EVISTA) 60 MG tablet, Take 1 tablet (60 mg total) by mouth daily., Disp: 30 tablet, Rfl: 5  EXAM:  VITALS per patient if applicable:  GENERAL: alert, oriented, appears well and in no acute distress  HEENT: atraumatic, conjunttiva clear, no obvious abnormalities on  inspection of external nose and ears  NECK: normal movements of the head and neck  LUNGS: on inspection no signs of respiratory distress, breathing rate appears normal, no obvious gross SOB, gasping or wheezing  CV: no obvious cyanosis  MS: moves all visible extremities without noticeable abnormality  PSYCH/NEURO: pleasant and cooperative, no obvious depression or anxiety, speech and thought processing grossly intact  ASSESSMENT AND PLAN:  Discussed the following assessment and plan:  Age-related osteoporosis without current pathological fracture  Mixed hyperlipidemia  Nonrheumatic aortic valve insufficiency  Essential hypertension  Osteoporosis Treated with Evista since late July  Suspending  med for one month due to reported S/E of edema.  Cramps  Hot flashes and elevated LDL .  Records from [facility] requested   Hyperlipidemia Dr Humphrey Rolls increased her pravastatin to 80 mg after march visit.  Repeat lipids were  reportedly noted raised her 10 yr risk to 18% DAnd her statin has been increased to 80 mg atorvastatin daily by Dr.  Laurelyn Sickle PA r  Nonrheumatic aortic valve insufficiency Diagnosed By ECHO March 2021 ([place])during ER follow up on syncopal episode that occurred during a hot shower.   Essential hypertension Well controlled on current regimen by home reports . Renal function stable, no changes today.  Lab Results  Component Value Date   CREATININE 0.50 02/22/2020   Lab Results  Component Value Date   NA 139 02/22/2020   K 4.2 02/22/2020   CL 104 02/22/2020   CO2 28 02/22/2020       I discussed the assessment and treatment plan with the patient. The patient was provided an opportunity to ask questions and all were answered. The patient agreed with the plan and demonstrated an understanding of the instructions.   The patient was advised to call back or seek an in-person evaluation if the symptoms worsen or if the condition fails to improve as anticipated.  I provided  30 minutes of face-to-face time during this encounter.   Crecencio Mc, MD

## 2020-10-15 NOTE — Assessment & Plan Note (Signed)
Treated with Evista since late July  Suspending  med for one month due to reported S/E of edema.  Cramps  Hot flashes and elevated LDL .  Records from [place] requested

## 2020-10-15 NOTE — Assessment & Plan Note (Addendum)
Dr Humphrey Rolls increased her pravastatin to 80 mg after march visit.  Repeat lipids were  reportedly noted raised her 10 yr risk to 18% DAnd her statin has been increased to 80 mg atorvastatin daily by Dr.  Laurelyn Sickle PA r

## 2020-10-15 NOTE — Assessment & Plan Note (Signed)
Well controlled on current regimen by home reports . Renal function stable, no changes today.  Lab Results  Component Value Date   CREATININE 0.50 02/22/2020   Lab Results  Component Value Date   NA 139 02/22/2020   K 4.2 02/22/2020   CL 104 02/22/2020   CO2 28 02/22/2020

## 2020-10-28 NOTE — Telephone Encounter (Signed)
Office notes from  [facility] .  ECHO  Was not included.  The cholesterol did not change from June to October. Did the change in cholesterol dose happen after that?

## 2020-10-28 NOTE — Telephone Encounter (Signed)
We received records from [facility] . They sent [place] labs from June and October,  And office notes from June visit. They did not include the ECHO.

## 2020-10-31 ENCOUNTER — Other Ambulatory Visit: Payer: Self-pay | Admitting: Internal Medicine

## 2020-10-31 MED ORDER — ATORVASTATIN CALCIUM 40 MG PO TABS: 40.0000 mg | ORAL_TABLET | Freq: Every day | ORAL | 3 refills | Status: DC

## 2020-12-18 ENCOUNTER — Telehealth: Payer: Self-pay | Admitting: Internal Medicine

## 2020-12-18 NOTE — Telephone Encounter (Signed)
Patient dropped off a temporary Handicapp form to be completed. Form is up front in color folder.

## 2020-12-19 NOTE — Telephone Encounter (Signed)
Noted...placed up front for pick up

## 2020-12-19 NOTE — Telephone Encounter (Signed)
Patient called want to pick up form

## 2020-12-19 NOTE — Telephone Encounter (Signed)
Placed in quick sign folder.  

## 2020-12-19 NOTE — Telephone Encounter (Signed)
LMTCB. Ned to find out if pt wants to come by and pick up form or have [place] mail it to her.

## 2020-12-28 DIAGNOSIS — H43811 Vitreous degeneration, right eye: Secondary | ICD-10-CM | POA: Diagnosis not present

## 2020-12-28 DIAGNOSIS — G43B0 Ophthalmoplegic migraine, not intractable: Secondary | ICD-10-CM | POA: Diagnosis not present

## 2020-12-28 DIAGNOSIS — H524 Presbyopia: Secondary | ICD-10-CM | POA: Diagnosis not present

## 2020-12-28 DIAGNOSIS — H2513 Age-related nuclear cataract, bilateral: Secondary | ICD-10-CM | POA: Diagnosis not present

## 2021-01-24 DIAGNOSIS — E7800 Pure hypercholesterolemia, unspecified: Secondary | ICD-10-CM | POA: Diagnosis not present

## 2021-01-24 DIAGNOSIS — I351 Nonrheumatic aortic (valve) insufficiency: Secondary | ICD-10-CM | POA: Diagnosis not present

## 2021-01-24 DIAGNOSIS — I10 Essential (primary) hypertension: Secondary | ICD-10-CM | POA: Diagnosis not present

## 2021-01-29 MED ORDER — RALOXIFENE HCL 60 MG PO TABS: 60.0000 mg | ORAL_TABLET | Freq: Every day | ORAL | 1 refills | Status: DC

## 2021-02-26 MED ORDER — AMLODIPINE BESYLATE 10 MG PO TABS: 10.0000 mg | ORAL_TABLET | Freq: Every day | ORAL | 1 refills | Status: DC

## 2021-04-03 DIAGNOSIS — Z23 Encounter for immunization: Secondary | ICD-10-CM | POA: Diagnosis not present

## 2021-05-14 DIAGNOSIS — M2011 Hallux valgus (acquired), right foot: Secondary | ICD-10-CM | POA: Diagnosis not present

## 2021-06-03 ENCOUNTER — Telehealth: Payer: Self-pay

## 2021-06-03 NOTE — Telephone Encounter (Signed)
Only the raloxifene needs suspension 2 weeks prior to surgery and resumed 4 weeks after surgery

## 2021-06-03 NOTE — Telephone Encounter (Signed)
Pt dropped off application for handicap placard. Placed in folder up front.

## 2021-06-03 NOTE — Telephone Encounter (Signed)
Placed in your quick sign folder

## 2021-06-11 ENCOUNTER — Other Ambulatory Visit: Payer: Self-pay | Admitting: Podiatry

## 2021-06-11 DIAGNOSIS — M2011 Hallux valgus (acquired), right foot: Secondary | ICD-10-CM | POA: Diagnosis not present

## 2021-06-12 ENCOUNTER — Encounter: Payer: Self-pay | Admitting: Podiatry

## 2021-06-24 NOTE — Discharge Instructions (Signed)
[facility]  POST OPERATIVE INSTRUCTIONS FOR DR. FOWLER AND [facility] POD[facility]PART[facility]   Take your medication as prescribed.  Pain medication should be taken only as needed.  Keep the dressing clean, dry and intact.  Keep your foot elevated above the heart level for the first 48 hours.  Walking to the bathroom and brief periods of walking are acceptable, unless we have instructed you to be non-weight bearing.  Always wear your post-op shoe when walking.  Always use your crutches if you are to be non-weight bearing.  Do not take a shower. Baths are permissible as long as the foot is kept out of the water.   Every hour you are awake:  Bend your knee 15 times. Flex foot 15 times Massage calf 15 times  Call [facility] ([phone]) if any of the following problems occur: You develop a temperature or fever. The bandage becomes saturated with blood. Medication does not stop your pain. Injury of the foot occurs. Any symptoms of infection including redness, odor, or red streaks running from wound. 

## 2021-06-25 NOTE — Anesthesia Preprocedure Evaluation (Addendum)
Anesthesia Evaluation  Patient identified by MRN, date of birth, ID band Patient awake    Reviewed: Allergy & Precautions, NPO status , Patient's Chart, lab work & pertinent test results  History of Anesthesia Complications Negative for: history of anesthetic complications  Airway Mallampati: I   Neck ROM: Full    Dental no notable dental hx.    Pulmonary neg pulmonary ROS,    Pulmonary exam normal breath sounds clear to auscultation       Cardiovascular hypertension, Normal cardiovascular exam+ Valvular Problems/Murmurs AI  Rhythm:Regular Rate:Normal     Neuro/Psych  Headaches,    GI/Hepatic negative GI ROS,   Endo/Other  negative endocrine ROS  Renal/GU negative Renal ROS     Musculoskeletal  (+) Arthritis ,   Abdominal   Peds  Hematology negative hematology ROS (+)   Anesthesia Other Findings   Reproductive/Obstetrics                            Anesthesia Physical Anesthesia Plan  ASA: 2  Anesthesia Plan: General   Post-op Pain Management:    Induction: Intravenous  PONV Risk Score and Plan: 3 and Ondansetron, Dexamethasone and Treatment may vary due to age or medical condition  Airway Management Planned: LMA  Additional Equipment:   Intra-op Plan:   Post-operative Plan: Extubation in OR  Informed Consent: I have reviewed the patients History and Physical, chart, labs and discussed the procedure including the risks, benefits and alternatives for the proposed anesthesia with the patient or authorized representative who has indicated his/her understanding and acceptance.       Plan Discussed with: CRNA  Anesthesia Plan Comments:        Anesthesia Quick Evaluation

## 2021-06-26 ENCOUNTER — Ambulatory Visit
Admission: RE | Admit: 2021-06-26 | Discharge: 2021-06-26 | Disposition: A | Discharge status: Home / Self Care | Payer: Medicare Other | Attending: Podiatry | Admitting: Podiatry

## 2021-06-26 ENCOUNTER — Ambulatory Visit: Payer: Medicare Other | Admitting: Anesthesiology

## 2021-06-26 ENCOUNTER — Encounter: Payer: Self-pay | Admitting: Podiatry

## 2021-06-26 ENCOUNTER — Encounter
Admission: RE | Disposition: A | Discharge status: Home / Self Care | Payer: Self-pay | Source: Home / Self Care | Attending: Podiatry

## 2021-06-26 ENCOUNTER — Other Ambulatory Visit: Payer: Self-pay

## 2021-06-26 DIAGNOSIS — Z7982 Long term (current) use of aspirin: Secondary | ICD-10-CM | POA: Diagnosis not present

## 2021-06-26 DIAGNOSIS — Z79899 Other long term (current) drug therapy: Secondary | ICD-10-CM | POA: Insufficient documentation

## 2021-06-26 DIAGNOSIS — M2011 Hallux valgus (acquired), right foot: Secondary | ICD-10-CM | POA: Diagnosis not present

## 2021-06-26 DIAGNOSIS — Z881 Allergy status to other antibiotic agents status: Secondary | ICD-10-CM | POA: Diagnosis not present

## 2021-06-26 HISTORY — PX: BUNIONECTOMY: SHX129

## 2021-06-26 SURGERY — BUNIONECTOMY
Anesthesia: General | Site: Toe | Laterality: Right

## 2021-06-26 MED ORDER — LACTATED RINGERS IV SOLN: INTRAVENOUS | Status: DC

## 2021-06-26 MED ORDER — SODIUM CHLORIDE 0.9 % IR SOLN
Status: DC | PRN
  Administered 2021-06-26: 1

## 2021-06-26 MED ORDER — BUPIVACAINE LIPOSOME 1.3 % IJ SUSP
INTRAMUSCULAR | Status: DC | PRN
  Administered 2021-06-26 (×2): 10 mL

## 2021-06-26 MED ORDER — GLYCOPYRROLATE 0.2 MG/ML IJ SOLN
INTRAMUSCULAR | Status: DC | PRN
  Administered 2021-06-26: .1 mg via INTRAVENOUS

## 2021-06-26 MED ORDER — BUPIVACAINE HCL (PF) 0.25 % IJ SOLN
INTRAMUSCULAR | Status: DC | PRN
  Administered 2021-06-26: 10 mL

## 2021-06-26 MED ORDER — ONDANSETRON HCL 4 MG/2ML IJ SOLN
INTRAMUSCULAR | Status: DC | PRN
  Administered 2021-06-26: 4 mg via INTRAVENOUS

## 2021-06-26 MED ORDER — LIDOCAINE HCL (CARDIAC) PF 100 MG/5ML IV SOSY
PREFILLED_SYRINGE | INTRAVENOUS | Status: DC | PRN
  Administered 2021-06-26: 40 mg via INTRATRACHEAL

## 2021-06-26 MED ORDER — POVIDONE-IODINE 7.5 % EX SOLN: Freq: Once | CUTANEOUS | Status: DC

## 2021-06-26 MED ORDER — OXYCODONE-ACETAMINOPHEN 5-325 MG PO TABS: 1.0000 | ORAL_TABLET | Freq: Four times a day (QID) | ORAL | 0 refills | Status: DC | PRN

## 2021-06-26 MED ORDER — FENTANYL CITRATE (PF) 100 MCG/2ML IJ SOLN
INTRAMUSCULAR | Status: DC | PRN
  Administered 2021-06-26: 50 ug via INTRAVENOUS
  Administered 2021-06-26: 25 ug via INTRAVENOUS

## 2021-06-26 MED ORDER — DEXAMETHASONE SODIUM PHOSPHATE 4 MG/ML IJ SOLN
INTRAMUSCULAR | Status: DC | PRN
  Administered 2021-06-26: 4 mg via INTRAVENOUS

## 2021-06-26 MED ORDER — EPHEDRINE SULFATE 50 MG/ML IJ SOLN
INTRAMUSCULAR | Status: DC | PRN
  Administered 2021-06-26 (×4): 5 mg via INTRAVENOUS

## 2021-06-26 MED ORDER — MIDAZOLAM HCL 5 MG/5ML IJ SOLN
INTRAMUSCULAR | Status: DC | PRN
  Administered 2021-06-26: 2 mg via INTRAVENOUS

## 2021-06-26 MED ORDER — CEFAZOLIN SODIUM-DEXTROSE 2-4 GM/100ML-% IV SOLN
2.0000 g | INTRAVENOUS | Status: AC
  Administered 2021-06-26: 2 g via INTRAVENOUS

## 2021-06-26 MED ORDER — PROPOFOL 10 MG/ML IV BOLUS
INTRAVENOUS | Status: DC | PRN
  Administered 2021-06-26: 120 mg via INTRAVENOUS

## 2021-06-26 SURGICAL SUPPLY — 43 items
BENZOIN TINCTURE PRP APPL 2/3 (GAUZE/BANDAGES/DRESSINGS) ×2 IMPLANT
BLADE OSC/SAGITTAL MD 9X18.5 (BLADE) ×1 IMPLANT
BLADE SAW LAPIPLASTY 40X11 (BLADE) ×1 IMPLANT
BLADE SURG 15 STRL LF DISP TIS (BLADE) IMPLANT
BLADE SURG 15 STRL SS (BLADE) ×1
BNDG COHESIVE 4X5 TAN STRL (GAUZE/BANDAGES/DRESSINGS) ×2 IMPLANT
BNDG CONFORM 3 STRL LF (GAUZE/BANDAGES/DRESSINGS) ×1 IMPLANT
BNDG ELASTIC 4X5.8 VLCR STR LF (GAUZE/BANDAGES/DRESSINGS) ×2 IMPLANT
BNDG ESMARK 4X12 TAN STRL LF (GAUZE/BANDAGES/DRESSINGS) ×2 IMPLANT
BNDG STRETCH 4X75 STRL LF (GAUZE/BANDAGES/DRESSINGS) ×2 IMPLANT
BOOT STEPPER DURA SM (SOFTGOODS) ×1 IMPLANT
CANISTER SUCT 1200ML W/VALVE (MISCELLANEOUS) ×2 IMPLANT
CONTROL 360 (Bone Implant) ×1 IMPLANT
COVER LIGHT HANDLE UNIVERSAL (MISCELLANEOUS) ×4 IMPLANT
CUFF TOURN SGL QUICK 18X4 (TOURNIQUET CUFF) ×1 IMPLANT
DRAPE FLUOR MINI C-ARM 54X84 (DRAPES) ×2 IMPLANT
DURAPREP 26ML APPLICATOR (WOUND CARE) ×2 IMPLANT
ELECT REM PT RETURN 9FT ADLT (ELECTROSURGICAL) ×2
ELECTRODE REM PT RTRN 9FT ADLT (ELECTROSURGICAL) ×1 IMPLANT
GAUZE SPONGE 4X4 12PLY STRL (GAUZE/BANDAGES/DRESSINGS) ×2 IMPLANT
GAUZE XEROFORM 1X8 LF (GAUZE/BANDAGES/DRESSINGS) ×2 IMPLANT
GLOVE SRG 8 PF TXTR STRL LF DI (GLOVE) ×1 IMPLANT
GLOVE SURG ENC MOIS LTX SZ7.5 (GLOVE) ×2 IMPLANT
GLOVE SURG UNDER POLY LF SZ8 (GLOVE) ×1
GOWN STRL REUS W/ TWL LRG LVL3 (GOWN DISPOSABLE) ×2 IMPLANT
GOWN STRL REUS W/TWL LRG LVL3 (GOWN DISPOSABLE) ×2
K-WIRE DBL END TROCAR 6X.045 (WIRE) ×2
KIT TURNOVER KIT A (KITS) ×2 IMPLANT
KWIRE DBL END TROCAR 6X.045 (WIRE) IMPLANT
NS IRRIG 500ML POUR BTL (IV SOLUTION) ×2 IMPLANT
PACK EXTREMITY ARMC (MISCELLANEOUS) ×2 IMPLANT
PENCIL SMOKE EVACUATOR (MISCELLANEOUS) ×2 IMPLANT
STAPLE SPR MET 9X9X9 1.5 (Staple) ×1 IMPLANT
STOCKINETTE IMPERVIOUS LG (DRAPES) ×2 IMPLANT
STRIP CLOSURE SKIN 1/4X4 (GAUZE/BANDAGES/DRESSINGS) ×2 IMPLANT
SUT ETHILON 5-0 FS-2 18 BLK (SUTURE) ×1 IMPLANT
SUT MNCRL 4-0 (SUTURE) ×1
SUT MNCRL 4-0 27XMFL (SUTURE) ×1
SUT VIC AB 3-0 SH 27 (SUTURE) ×1
SUT VIC AB 3-0 SH 27X BRD (SUTURE) IMPLANT
SUT VIC AB 4-0 SH 27 (SUTURE) ×1
SUT VIC AB 4-0 SH 27XANBCTRL (SUTURE) IMPLANT
SUTURE MNCRL 4-0 27XMF (SUTURE) IMPLANT

## 2021-06-26 NOTE — Anesthesia Procedure Notes (Signed)
Procedure Name: LMA Insertion Date/Time: 06/26/2021 12:11 PM Performed by: Mayme Genta, CRNA Pre-anesthesia Checklist: Patient identified, Emergency Drugs available, Suction available, Timeout performed and Patient being monitored Patient Re-evaluated:Patient Re-evaluated prior to induction Oxygen Delivery Method: Circle system utilized Preoxygenation: Pre-oxygenation with 100% oxygen Induction Type: IV induction LMA: LMA inserted LMA Size: 4.0 Number of attempts: 1 Placement Confirmation: positive ETCO2 and breath sounds checked- equal and bilateral Tube secured with: Tape

## 2021-06-26 NOTE — H&P (Signed)
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  The various methods of treatment have been discussed with the patient.  No guarantees were given.  After consideration of risks, benefits and other options for treatment, the patient has consented to surgery.  I have reviewed the patients' chart and labs.     A history and physical examination was performed in my office.  The patient was reexamined.  There have been no changes to this history and physical examination.  Samara Deist A

## 2021-06-26 NOTE — Anesthesia Postprocedure Evaluation (Signed)
Anesthesia Post Note  Patient: Shannon Hickman  Procedure(s) Performed: Lillard Anes- LAPIDUS TYPE, PHALANX OSTEOTOMY- AKIN (Right: Toe)     Patient location during evaluation: PACU Anesthesia Type: General Level of consciousness: awake and alert, oriented and patient cooperative Pain management: pain level controlled Vital Signs Assessment: post-procedure vital signs reviewed and stable Respiratory status: spontaneous breathing, nonlabored ventilation and respiratory function stable Cardiovascular status: blood pressure returned to baseline and stable Postop Assessment: adequate PO intake Anesthetic complications: no   No notable events documented.  Darrin Nipper

## 2021-06-26 NOTE — Transfer of Care (Signed)
Immediate Anesthesia Transfer of Care Note  Patient: Shannon Hickman  Procedure(s) Performed: Lillard Anes- LAPIDUS TYPE, PHALANX OSTEOTOMY- AKIN (Right: Toe)  Patient Location: PACU  Anesthesia Type: General  Level of Consciousness: awake, alert  and patient cooperative  Airway and Oxygen Therapy: Patient Spontanous Breathing and Patient connected to supplemental oxygen  Post-op Assessment: Post-op Vital signs reviewed, Patient's Cardiovascular Status Stable, Respiratory Function Stable, Patent Airway and No signs of Nausea or vomiting  Post-op Vital Signs: Reviewed and stable  Complications: No notable events documented.

## 2021-06-26 NOTE — Op Note (Signed)
Operative note   Surgeon:Mireya Meditz Lawyer: None    Preop diagnosis: Hallux valgus deformity right foot    Postop diagnosis: Same    Procedure: 1 Lapidus hallux valgus correction right foot  2.  Akin proximal phalanx osteotomy right foot    EBL: Minimal    Anesthesia:local and general.  Local consisted of a total of 10 cc of 0.25% bupivacaine and 20 cc of Exparel long-acting anesthetic    Hemostasis: Mid calf tourniquet inflated to 200 mmHg for 90 minutes    Specimen: None    Complications: None    Operative indications:Shannon Hickman is an 75 y.o. that presents today for surgical intervention.  The risks/benefits/alternatives/complications have been discussed and consent has been given.    Procedure:  Patient was brought into the OR and placed on the operating table in thesupine position. After anesthesia was obtained theright lower extremity was prepped and draped in usual sterile fashion.  Attention was directed to the dorsal aspect of the foot where a dorsal incision was made at the first met cuneiforms joint.  Sharp and blunt dissection was carried down to the periosteum.  Subperiosteal dissection was then undertaken.  This exposed the first met cuneiform joint.  This was then freed and loosened.  A small fulcrum was placed between the base of the first metatarsal and second metatarsal.  Next the joint positioner was placed on the medial aspect of the metatarsal.  A small stab incision was made at the second metatarsal.  Compression was placed for the positioner.  Good realignment of the first intermetatarsal angle was noted at this time.  Attention was to redirected to the first met cuneiform joint.  At this time the osteotomy cut guide was placed into the joint region.  2 vertical cuts were then made.  The cartilage material was removed from the first met cuneiform joint and the joint was then prepped with a 2.0 mm drill bit.  The joint compressor was then placed.  Good  compression and realignment was noted.  Next the medial and dorsal locking plates were then placed from the [place] set.  Realignment and stability was noted in all planes.  There was residual valgus of the great toe.  At this time the decision was made to perform the Akin osteotomy.  The proximal phalanx was exposed.  A small V osteotomy with the apex lateral was created.  This was then compressed and a small compression bone staple was applied.  Good realignment was noted.  Closure was then performed with a 3-0 and 4-0 Vicryl the deeper layers and a 5-0 Monocryl undyed for the skin.  Further local anesthesia was performed at the end of the case.  A bulky sterile dressing was then applied.    Patient tolerated the procedure and anesthesia well.  Was transported from the OR to the PACU with all vital signs stable and vascular status intact. To be discharged per routine protocol.  Will follow up in approximately 1 week in the outpatient clinic.

## 2021-06-28 ENCOUNTER — Encounter: Payer: Self-pay | Admitting: Podiatry

## 2021-07-02 DIAGNOSIS — M2011 Hallux valgus (acquired), right foot: Secondary | ICD-10-CM | POA: Diagnosis not present

## 2021-07-29 DIAGNOSIS — E7800 Pure hypercholesterolemia, unspecified: Secondary | ICD-10-CM | POA: Diagnosis not present

## 2021-07-29 DIAGNOSIS — I10 Essential (primary) hypertension: Secondary | ICD-10-CM | POA: Diagnosis not present

## 2021-07-29 DIAGNOSIS — I351 Nonrheumatic aortic (valve) insufficiency: Secondary | ICD-10-CM | POA: Diagnosis not present

## 2021-08-05 ENCOUNTER — Ambulatory Visit (INDEPENDENT_AMBULATORY_CARE_PROVIDER_SITE_OTHER): Payer: Medicare Other

## 2021-08-05 VITALS — Ht 64.0 in | Wt 148.0 lb

## 2021-08-05 DIAGNOSIS — Z0000 Encounter for general adult medical examination without abnormal findings: Secondary | ICD-10-CM | POA: Diagnosis not present

## 2021-08-05 DIAGNOSIS — Z1231 Encounter for screening mammogram for malignant neoplasm of breast: Secondary | ICD-10-CM | POA: Diagnosis not present

## 2021-08-05 NOTE — Progress Notes (Signed)
Subjective:   Shannon Hickman is a 75 y.o. female who presents for Medicare Annual (Subsequent) preventive examination.  Review of Systems    No ROS.  Medicare Wellness Virtual Visit.  Visual/audio telehealth visit, UTA vital signs.   See social history for additional risk factors.   Cardiac Risk Factors include: advanced age (>49mn, >>27women);hypertension     Objective:    Today's Vitals   08/05/21 0832  Weight: 148 lb (67.1 kg)  Height: '5\' 4"'$  (1.626 m)   Body mass index is 25.4 kg/m.  Advanced Directives 08/05/2021 06/26/2021 08/02/2020 02/22/2020 08/02/2019 01/08/2017 07/31/2015  Does Patient Have a Medical Advance Directive? Yes Yes Yes No Yes Yes Yes  Type of AParamedicof ABirchwoodLiving will HHomer GlenLiving will H[place]antationLiving will - HBalfourLiving will HFive PointsLiving will HCortlandLiving will  Does patient want to make changes to medical advance directive? No - Patient declined No - Patient declined No - Patient declined - No - Patient declined - -  Copy of HThief River Fallsin Chart? No - copy requested Yes - validated most recent copy scanned in chart (See row information) No - copy requested - No - copy requested - -    Current Medications (verified) Outpatient Encounter Medications as of 08/05/2021  Medication Sig   acetaminophen (TYLENOL) 325 MG tablet Take 650 mg by mouth every 6 (six) hours as needed.   amLODipine (NORVASC) 10 MG tablet Take 1 tablet (10 mg total) by mouth daily.   atorvastatin (LIPITOR) 40 MG tablet Take 1 tablet (40 mg total) by mouth daily.   CALCIUM PO Take by mouth daily.   cetirizine (ZYRTEC) 10 MG tablet Take 10 mg by mouth daily.    Coenzyme Q10 (COQ10) 100 MG CAPS Take 1 capsule by mouth daily.   Multiple Vitamins-Minerals (CENTRUM SILVER ULTRA WOMENS PO) Take 1 tablet by mouth daily.   raloxifene (EVISTA)  60 MG tablet Take 1 tablet (60 mg total) by mouth daily.   vitamin B-12 (CYANOCOBALAMIN) 1000 MCG tablet Take 1,000 mcg by mouth daily.   [DISCONTINUED] oxyCODONE-acetaminophen (PERCOCET) 5-325 MG tablet Take 1-2 tablets by mouth every 6 (six) hours as needed for severe pain. Max 6 tabs per day   No facility-administered encounter medications on file as of 08/05/2021.    Allergies (verified) Minocycline   History: Past Medical History:  Diagnosis Date   Chicken pox    Diverticulitis    Heart murmur    High cholesterol    Hypertension    Inflammatory polyps of colon ([place]    Migraine    Urinary tract infection    Past Surgical History:  Procedure Laterality Date   APPENDECTOMY  1998   BUNIONECTOMY Left 01/08/2017   Procedure: Arthrodesis Metatarsalphalangeal joint MTPJ Left;  Surgeon: MAlbertine Patricia DPM;  [facility] [facility]  Service: Podiatry;  Laterality: Left;   BUNIONECTOMY Right 06/26/2021   Procedure: BUNIONECTOMY- LAPIDUS TYPE, PHALANX OSTEOTOMY- AKIN;  Surgeon: FSamara Deist DPM;  [facility]  Service: Podiatry;  Laterality: Right;   HISTOPLASMOSIS     KNEE ARTHROSCOPY Left 08/02/2015   Procedure: ARTHROSCOPY KNEE, PARTIAL MEDIAL MENISCECTOMY;  Surgeon: MHessie Knows MD;  Location: [facility];  Service: Orthopedics;  Laterality: Left;   PARTIAL COLECTOMY     diverticular rupture, WRochel Brome   Family History  Problem Relation Age of Onset   Heart disease Father 646  Diabetes Father 32   Stomach cancer Maternal Grandfather    Cancer Neg Hx    Social History   Socioeconomic History   Marital status: Married    Spouse name: Not on file   Number of children: Not on file   Years of education: Not on file   Highest education level: Not on file  Occupational History   Not on file  Tobacco Use   Smoking status: Never   Smokeless tobacco: Never  Vaping Use   Vaping Use: Never used  Substance and Sexual Activity   Alcohol use: No    Drug use: Not on file   Sexual activity: Yes  Other Topics Concern   Not on file  Social History Narrative   Not on file   Social Determinants of Health   Financial Resource Strain: Low Risk    Difficulty of Paying Living Expenses: Not hard at all  Food Insecurity: No Food Insecurity   Worried About Charity fundraiser in the Last Year: Never true   [place] in the Last Year: Never true  Transportation Needs: No Transportation Needs   Lack of Transportation (Medical): No   Lack of Transportation (Non-Medical): No  Physical Activity: Not on file  Stress: No Stress Concern Present   Feeling of Stress : Not at all  Social Connections: Socially Integrated   Frequency of Communication with Friends and Family: More than three times a week   Frequency of Social Gatherings with Friends and Family: More than three times a week   Attends Religious Services: More than 4 times per year   Active Member of [employer] or Organizations: Yes   Attends Music therapist: Not on file   Marital Status: Married    Tobacco Counseling Counseling given: Not Answered   Clinical Intake:  Pre-visit preparation completed: Yes        Diabetes: No  How often do you need to have someone help you when you read instructions, pamphlets, or other written materials from your doctor or pharmacy?: 1 - Never  Interpreter Needed?: No      Activities of Daily Living In your present state of health, do you have any difficulty performing the following activities: 08/05/2021 06/26/2021  Hearing? N N  Vision? N N  Difficulty concentrating or making decisions? N N  Walking or climbing stairs? Y N  Comment R foot in walking boot. Paces self. -  Dressing or bathing? N N  Doing errands, shopping? Y -  Comment R foot is currently unable to assist with driving. -  Preparing Food and eating ? N -  Using the Toilet? N -  In the past six months, have you accidently leaked urine? N -  Do you have  problems with loss of bowel control? N -  Managing your Medications? N -  Managing your Finances? N -  Housekeeping or managing your Housekeeping? N -  Some recent data might be hidden    Patient Care Team: Crecencio Mc, MD as PCP - General (Internal Medicine)  Indicate any recent Medical Services you may have received from other than [facility] providers in the past year (date may be approximate).     Assessment:   This is a routine wellness examination for Joniece.  I connected with Trey today by telephone and verified that I am speaking with the correct person using two identifiers. Location patient: home Location provider: work Persons participating in the virtual visit: patient, Marine scientist.  I discussed the limitations, risks, security and privacy concerns of performing an evaluation and management service by telephone and the availability of in person appointments. The patient expressed understanding and verbally consented to this telephonic visit.    Interactive audio and video telecommunications were attempted between this provider and patient, however failed, due to patient having technical difficulties OR patient did not have access to video capability.  We continued and completed visit with audio only.  Some vital signs may be absent or patient reported.   Hearing/Vision screen Hearing Screening - Comments:: Patient is able to hear conversational tones without difficulty.  No issues reported. Vision Screening - Comments:: Followed by [facility]  Wears corrective lenses They have seen their ophthalmologist in the last 12 months.   Dietary issues and exercise activities discussed:   Regular diet Good water   Goals Addressed             This Visit's Progress    Maintain Healthy Lifestyle       Stay active Healthy diet       Depression Screen PHQ 2/9 Scores 08/05/2021 08/02/2020 08/02/2019 09/06/2018 09/02/2017 09/01/2016 08/31/2015  PHQ - 2 Score 0 0 0 0 0 0 0   PHQ- 9 Score - - - 0 1 - -    Fall Risk Fall Risk  08/05/2021 10/15/2020 08/02/2020 07/11/2020 08/02/2019  Falls in the past year? 0 1 (No Data) 1 0  Comment - - None since previously reported in the last 30 days - -  Number falls in past yr: 0 0 - 0 -  Injury with Fall? 0 0 - 0 -  Follow up Falls evaluation completed Falls evaluation completed Falls evaluation completed Falls evaluation completed -    FALL RISK PREVENTION PERTAINING TO THE HOME: Adequate lighting in your home to reduce risk of falls? Yes   ASSISTIVE DEVICES UTILIZED TO PREVENT FALLS: Life alert? No  Use of a cane, walker or w/c? No   TIMED UP AND GO: Was the test performed? No .   Cognitive Function:  Patient is alert and oriented x3.    6CIT Screen 08/02/2019  What Year? 0 points  What month? 0 points  What time? 0 points  Count back from 20 0 points  Months in reverse 0 points  Repeat phrase 0 points  Total Score 0    Immunizations Immunization History  Administered Date(s) Administered   Influenza Split 09/15/2012, 09/28/2014, 08/26/2016   Influenza, High Dose Seasonal PF 09/01/2017, 08/18/2018, 07/28/2019, 09/07/2020   Influenza-Unspecified 08/15/2013, 09/18/2014, 08/28/2015, 08/27/2016, 09/01/2017   PFIZER(Purple Top)SARS-COV-2 Vaccination 01/07/2020, 01/28/2020, 09/07/2020, 04/03/2021   Pneumococcal Conjugate-13 08/25/2014   Pneumococcal Polysaccharide-23 03/16/2013, 07/28/2019   Tdap 01/16/2011   Zoster, Live 01/16/2011    Health Maintenance There are no preventive care reminders to display for this patient. Health Maintenance  Topic Date Due   COVID-19 Vaccine (5 - Booster for Pfizer series) 08/21/2021 (Originally 08/03/2021)   INFLUENZA VACCINE  09/21/2021 (Originally 07/15/2021)   Zoster Vaccines- Shingrix (1 of 2) 11/05/2021 (Originally 08/28/1965)   TETANUS/TDAP  08/05/2022 (Originally 01/16/2021)   MAMMOGRAM  08/24/2021   COLONOSCOPY (Pts 45-10yr Insurance coverage will need to be  confirmed)  06/20/2022   DEXA SCAN  Completed   Hepatitis C Screening  Completed   PNA vac Low Risk Adult  Completed   HPV VACCINES  Aged Out   Lung Cancer Screening: (Low Dose CT Chest recommended if Age 74-80years, 30 pack-year currently smoking OR have quit  w/in 15years.) does not qualify.   Vision Screening: Recommended annual ophthalmology exams for early detection of glaucoma and other disorders of the eye.  Dental Screening: Recommended annual dental exams for proper oral hygiene  Community Resource Referral / Chronic Care Management: CRR required this visit?  No   CCM required this visit?  No      Plan:   Keep all routine maintenance appointments.   I have personally reviewed and noted the following in the patient's chart:   Medical and social history Use of alcohol, tobacco or illicit drugs  Current medications and supplements including opioid prescriptions. Not taking opioid.  Functional ability and status Nutritional status Physical activity Advanced directives List of other physicians Hospitalizations, surgeries, and ER visits in previous 12 months Vitals Screenings to include cognitive, depression, and falls Referrals and appointments  In addition, I have reviewed and discussed with patient certain preventive protocols, quality metrics, and best practice recommendations. A written personalized care plan for preventive services as well as general preventive health recommendations were provided to patient via mychart.     Varney Biles, LPN   [id number]

## 2021-08-05 NOTE — Patient Instructions (Addendum)
  Ms. Reis , Thank you for taking time to come for your Medicare Wellness Visit. I appreciate your ongoing commitment to your health goals. Please review the following plan we discussed and let me know if I can assist you in the future.   These are the goals we discussed:  Goals      Maintain Healthy Lifestyle     Stay active Healthy diet        This is a list of the screening recommended for you and due dates:  Health Maintenance  Topic Date Due   COVID-19 Vaccine (5 - Booster for Pfizer series) 08/21/2021*   Flu Shot  09/21/2021*   Zoster (Shingles) Vaccine (1 of 2) 11/05/2021*   Tetanus Vaccine  08/05/2022*   Mammogram  08/24/2021   Colon Cancer Screening  06/20/2022   DEXA scan (bone density measurement)  Completed   Hepatitis C Screening: USPSTF Recommendation to screen - Ages 39-79 yo.  Completed   Pneumonia vaccines  Completed   HPV Vaccine  Aged Out  *Topic was postponed. The date shown is not the original due date.

## 2021-08-08 DIAGNOSIS — M2011 Hallux valgus (acquired), right foot: Secondary | ICD-10-CM | POA: Diagnosis not present

## 2021-08-09 ENCOUNTER — Telehealth: Payer: Self-pay | Admitting: Internal Medicine

## 2021-08-09 DIAGNOSIS — R7301 Impaired fasting glucose: Secondary | ICD-10-CM

## 2021-08-09 DIAGNOSIS — Z79899 Other long term (current) drug therapy: Secondary | ICD-10-CM

## 2021-08-09 DIAGNOSIS — E782 Mixed hyperlipidemia: Secondary | ICD-10-CM

## 2021-08-09 DIAGNOSIS — I10 Essential (primary) hypertension: Secondary | ICD-10-CM

## 2021-08-09 NOTE — Telephone Encounter (Signed)
Patient is scheduled to have labs at her next office visit but she was told by Dr.Tullo in the past that Medicare will not pay for both on the same day.Please advise the patient.

## 2021-08-12 NOTE — Telephone Encounter (Signed)
Pt would like to have lab work done prior to appt on 09/20/2021. I have ordered CBC, A1c, CMP, lipid panel and microalbumin. Is there anything else that needs to be ordered?

## 2021-08-26 DIAGNOSIS — Z1231 Encounter for screening mammogram for malignant neoplasm of breast: Secondary | ICD-10-CM | POA: Diagnosis not present

## 2021-08-26 LAB — HM MAMMOGRAPHY

## 2021-08-27 MED ORDER — AMLODIPINE BESYLATE 10 MG PO TABS: 10.0000 mg | ORAL_TABLET | Freq: Every day | ORAL | 0 refills | Status: DC

## 2021-08-30 ENCOUNTER — Encounter: Payer: Self-pay | Admitting: Internal Medicine

## 2021-09-09 MED ORDER — RALOXIFENE HCL 60 MG PO TABS: 60.0000 mg | ORAL_TABLET | Freq: Every day | ORAL | 0 refills | Status: DC

## 2021-09-16 ENCOUNTER — Other Ambulatory Visit (INDEPENDENT_AMBULATORY_CARE_PROVIDER_SITE_OTHER): Payer: Medicare Other

## 2021-09-16 ENCOUNTER — Other Ambulatory Visit: Payer: Self-pay

## 2021-09-16 DIAGNOSIS — E782 Mixed hyperlipidemia: Secondary | ICD-10-CM

## 2021-09-16 DIAGNOSIS — Z79899 Other long term (current) drug therapy: Secondary | ICD-10-CM

## 2021-09-16 DIAGNOSIS — I10 Essential (primary) hypertension: Secondary | ICD-10-CM | POA: Diagnosis not present

## 2021-09-16 DIAGNOSIS — R7301 Impaired fasting glucose: Secondary | ICD-10-CM

## 2021-09-16 LAB — CBC WITH DIFFERENTIAL/PLATELET
Basophils Absolute: 0 10*3/uL (ref 0.0–0.1)
Basophils Relative: 0.8 % (ref 0.0–3.0)
Eosinophils Absolute: 0.1 10*3/uL (ref 0.0–0.7)
Eosinophils Relative: 3.5 % (ref 0.0–5.0)
HCT: 40.9 % (ref 36.0–46.0)
Hemoglobin: 13.6 g/dL (ref 12.0–15.0)
Lymphocytes Relative: 22.1 % (ref 12.0–46.0)
Lymphs Abs: 0.9 10*3/uL (ref 0.7–4.0)
MCHC: 33.2 g/dL (ref 30.0–36.0)
MCV: 101.3 fl — ABNORMAL HIGH (ref 78.0–100.0)
Monocytes Absolute: 0.5 10*3/uL (ref 0.1–1.0)
Monocytes Relative: 11.3 % (ref 3.0–12.0)
Neutro Abs: 2.6 10*3/uL (ref 1.4–7.7)
Neutrophils Relative %: 62.3 % (ref 43.0–77.0)
Platelets: 188 10*3/uL (ref 150.0–400.0)
RBC: 4.03 Mil/uL (ref 3.87–5.11)
RDW: 13.9 % (ref 11.5–15.5)
WBC: 4.2 10*3/uL (ref 4.0–10.5)

## 2021-09-16 LAB — LIPID PANEL
Cholesterol: 158 mg/dL (ref 0–200)
HDL: 72.3 mg/dL (ref >39.00)
LDL Cholesterol: 72 mg/dL (ref 0–99)
NonHDL: 85.62
Total CHOL/HDL Ratio: 2
Triglycerides: 68 mg/dL (ref 0.0–149.0)
VLDL: 13.6 mg/dL (ref 0.0–40.0)

## 2021-09-16 LAB — COMPREHENSIVE METABOLIC PANEL
ALT: 35 U/L (ref 0–35)
AST: 31 U/L (ref 0–37)
Albumin: 4.4 g/dL (ref 3.5–5.2)
Alkaline Phosphatase: 80 U/L (ref 39–117)
BUN: 17 mg/dL (ref 6–23)
CO2: 28 mEq/L (ref 19–32)
Calcium: 9.5 mg/dL (ref 8.4–10.5)
Chloride: 104 mEq/L (ref 96–112)
Creatinine, Ser: 0.74 mg/dL (ref 0.40–1.20)
GFR: 79.91 mL/min (ref >60.00)
Glucose, Bld: 84 mg/dL (ref 70–99)
Potassium: 5.1 mEq/L (ref 3.5–5.1)
Sodium: 140 mEq/L (ref 135–145)
Total Bilirubin: 0.5 mg/dL (ref 0.2–1.2)
Total Protein: 6.9 g/dL (ref 6.0–8.3)

## 2021-09-16 LAB — HEMOGLOBIN A1C: Hgb A1c MFr Bld: 5.3 % (ref 4.6–6.5)

## 2021-09-16 NOTE — Addendum Note (Signed)
Addended by: Leeanne Rio on: 09/16/2021 04:22 PM   Modules accepted: Orders

## 2021-09-16 NOTE — Addendum Note (Signed)
Addended by: Leeanne Rio on: 09/16/2021 12:36 PM   Modules accepted: Orders

## 2021-09-17 DIAGNOSIS — Z23 Encounter for immunization: Secondary | ICD-10-CM | POA: Diagnosis not present

## 2021-09-17 LAB — MICROALBUMIN / CREATININE URINE RATIO
Creatinine,U: 32.7 mg/dL
Microalb Creat Ratio: 2.1 mg/g (ref 0.0–30.0)
Microalb, Ur: <0.7 mg/dL (ref 0.0–1.9)

## 2021-09-20 ENCOUNTER — Other Ambulatory Visit: Payer: Self-pay

## 2021-09-20 ENCOUNTER — Encounter: Payer: Self-pay | Admitting: Internal Medicine

## 2021-09-20 ENCOUNTER — Ambulatory Visit (INDEPENDENT_AMBULATORY_CARE_PROVIDER_SITE_OTHER): Payer: Medicare Other | Admitting: Internal Medicine

## 2021-09-20 VITALS — BP 130/72 | HR 81 | Temp 96.2°F | Ht 64.0 in | Wt 149.6 lb

## 2021-09-20 DIAGNOSIS — I10 Essential (primary) hypertension: Secondary | ICD-10-CM

## 2021-09-20 DIAGNOSIS — E538 Deficiency of other specified B group vitamins: Secondary | ICD-10-CM

## 2021-09-20 DIAGNOSIS — M810 Age-related osteoporosis without current pathological fracture: Secondary | ICD-10-CM | POA: Diagnosis not present

## 2021-09-20 DIAGNOSIS — E782 Mixed hyperlipidemia: Secondary | ICD-10-CM

## 2021-09-20 NOTE — Progress Notes (Signed)
Patient ID: Shannon Hickman, female    DOB: 1946/10/05  Age: 75 y.o. MRN: [id number]  The patient is here for follow up and  management of other chronic and acute problems.   The risk factors are reflected in the social history.  The roster of all physicians providing medical care to patient - is listed in the Snapshot section of the chart.  Activities of daily living:  The patient is 100% independent in all ADLs: dressing, toileting, feeding as well as independent mobility  Home safety : The patient has smoke detectors in the home. They wear seatbelts.  There are no firearms at home. There is no violence in the home.   There is no risks for hepatitis, STDs or HIV. There is no   history of blood transfusion. They have no travel history to infectious disease endemic areas of the world.  The patient has seen their dentist in the last six months.  They have seen their eye doctor in the last year. They admit to slight hearing difficulty with regard to whispered voices and some television programs.  They have deferred audiologic testing in the last year.  They do not  have excessive sun exposure. Discussed the need for sun protection: hats, long sleeves and use of sunscreen if there is significant sun exposure.   Diet: the importance of a healthy diet is discussed. They do have a healthy diet.  The benefits of regular aerobic exercise were discussed. She walks 4 times per week ,  20 minutes.   Depression screen: there are no signs or vegative symptoms of depression- irritability, change in appetite, anhedonia, sadness/tearfullness.  Cognitive assessment: the patient manages all their financial and personal affairs and is actively engaged. They could relate day,date,year and events; recalled 2/3 objects at 3 minutes; performed clock-face test normally.  The following portions of the patient's history were reviewed and updated as appropriate: allergies, current medications, past family history, past  medical history,  past surgical history, past social history  and problem list.  Visual acuity was not assessed per patient preference since she has regular follow up with her ophthalmologist. Hearing and body mass index were assessed and reviewed.   During the course of the visit the patient was educated and counseled about appropriate screening and preventive services including : fall prevention , diabetes screening, nutrition counseling, colorectal cancer screening, and recommended immunizations.    CC: The primary encounter diagnosis was Age-related osteoporosis without current pathological fracture. Diagnoses of Essential hypertension, B12 deficiency, and Mixed hyperlipidemia were also pertinent to this visit.  1) Hypertension: patient checks blood pressure twice weekly at home.  Readings have been for the most part < 140/80 at rest . Patient is following a reduce salt diet most days and is taking medications as prescribed   History Terriyah has a past medical history of Chicken pox, Diverticulitis, Heart murmur, High cholesterol, Hypertension, Inflammatory polyps of colon ([facility]), Migraine, and Urinary tract infection.   She has a past surgical history that includes HISTOPLASMOSIS; Appendectomy (1998); Partial colectomy; Knee arthroscopy (Left, 08/02/2015); Bunionectomy (Left, 01/08/2017); and Bunionectomy (Right, 06/26/2021).   Her family history includes Diabetes (age of onset: 31) in her father; Heart disease (age of onset: 70) in her father; Stomach cancer in her maternal grandfather.She reports that she has never smoked. She has never used smokeless tobacco. She reports that she does not drink alcohol. No history on file for drug use.  Outpatient Medications Prior to Visit  Medication Sig Dispense Refill  acetaminophen (TYLENOL) 325 MG tablet Take 650 mg by mouth every 6 (six) hours as needed.     amLODipine (NORVASC) 10 MG tablet Take 1 tablet (10 mg total) by mouth daily. 90 tablet 0    atorvastatin (LIPITOR) 40 MG tablet Take 1 tablet (40 mg total) by mouth daily. 90 tablet 3   CALCIUM PO Take by mouth daily.     cetirizine (ZYRTEC) 10 MG tablet Take 10 mg by mouth daily.      Coenzyme Q10 (COQ10) 100 MG CAPS Take 1 capsule by mouth daily.     Multiple Vitamins-Minerals (CENTRUM SILVER ULTRA WOMENS PO) Take 1 tablet by mouth daily.     raloxifene (EVISTA) 60 MG tablet Take 1 tablet (60 mg total) by mouth daily. 90 tablet 0   vitamin B-12 (CYANOCOBALAMIN) 1000 MCG tablet Take 1,000 mcg by mouth daily.     No facility-administered medications prior to visit.    Review of Systems  Patient denies headache, fevers, malaise, unintentional weight loss, skin rash, eye pain, sinus congestion and sinus pain, sore throat, dysphagia,  hemoptysis , cough, dyspnea, wheezing, chest pain, palpitations, orthopnea, edema, abdominal pain, nausea, melena, diarrhea, constipation, flank pain, dysuria, hematuria, urinary  Frequency, nocturia, numbness, tingling, seizures,  Focal weakness, Loss of consciousness,  Tremor, insomnia, depression, anxiety, and suicidal ideation.    Objective:  BP 130/72 (BP Location: Left Arm, Patient Position: Sitting, Cuff Size: Normal)   Pulse 81   Temp (!) 96.2 F (35.7 C) (Temporal)   Ht 5\' 4"  (1.626 m)   Wt 149 lb 9.6 oz (67.9 kg)   SpO2 99%   BMI 25.68 kg/m   Physical Exam  General appearance: alert, cooperative and appears stated age Head: Normocephalic, without obvious abnormality, atraumatic Eyes: conjunctivae/corneas clear. PERRL, EOM's intact. Fundi benign. Ears: normal TM's and external ear canals both ears Nose: Nares normal. Septum midline. Mucosa normal. No drainage or sinus tenderness. Throat: lips, mucosa, and tongue normal; teeth and gums normal Neck: no adenopathy, no carotid bruit, no JVD, supple, symmetrical, trachea midline and thyroid not enlarged, symmetric, no tenderness/mass/nodules Lungs: clear to auscultation  bilaterally Breasts: normal appearance, no masses or tenderness Heart: regular rate and rhythm, S1, S2 normal, no murmur, click, rub or gallop Abdomen: soft, non-tender; bowel sounds normal; no masses,  no organomegaly Extremities: extremities normal, atraumatic, no cyanosis or edema Pulses: 2+ and symmetric Skin: Skin color, texture, turgor normal. No rashes or lesions Neurologic: Alert and oriented X 3, normal strength and tone. Normal symmetric reflexes. Normal coordination and gait.     Assessment & Plan:   Problem List Items Addressed This Visit       Unprioritized   Essential hypertension    Well controlled on amlodipine . Renal function stable, no changes today.      Relevant Orders   Comprehensive metabolic panel   Hyperlipidemia    Controlled on  80 mg atorvastatin daily.  LFTs normal.   Lab Results  Component Value Date   CHOL 158 09/16/2021   HDL 72.30 09/16/2021   LDLCALC 72 09/16/2021   LDLDIRECT 104 (H) 10/14/2019   TRIG 68.0 09/16/2021   CHOLHDL 2 09/16/2021   Lab Results  Component Value Date   ALT 35 09/16/2021   AST 31 09/16/2021   ALKPHOS 80 09/16/2021   BILITOT 0.5 09/16/2021         Osteoporosis - Primary   Relevant Orders   DG Bone Density   Other Visit Diagnoses  B12 deficiency       Relevant Orders   B12 and Folate Panel      No orders of the defined types were placed in this encounter.   There are no discontinued medications.  Follow-up: Return in about 6 months (around 03/21/2022).   Crecencio Mc, MD

## 2021-09-20 NOTE — Patient Instructions (Signed)
DEXA scan has been ordered.  Continue Evista,  Calcium,  Vitamin D and weight bearing exercise (walking is best)   Colonoscopy is due next year.  [facility] and [facility] have several very good proceduralists:  Madolyn Frieze, Andrey Farmer,  Guido Sander,  are all very well liked

## 2021-09-22 NOTE — Assessment & Plan Note (Signed)
Controlled on  80 mg atorvastatin daily.  LFTs normal.   Lab Results  Component Value Date   CHOL 158 09/16/2021   HDL 72.30 09/16/2021   LDLCALC 72 09/16/2021   LDLDIRECT 104 (H) 10/14/2019   TRIG 68.0 09/16/2021   CHOLHDL 2 09/16/2021   Lab Results  Component Value Date   ALT 35 09/16/2021   AST 31 09/16/2021   ALKPHOS 80 09/16/2021   BILITOT 0.5 09/16/2021

## 2021-09-22 NOTE — Assessment & Plan Note (Signed)
Well controlled on amlodipine . Renal function stable, no changes today.

## 2021-09-24 DIAGNOSIS — M2011 Hallux valgus (acquired), right foot: Secondary | ICD-10-CM | POA: Diagnosis not present

## 2021-10-24 DIAGNOSIS — J019 Acute sinusitis, unspecified: Secondary | ICD-10-CM | POA: Diagnosis not present

## 2021-10-24 DIAGNOSIS — R059 Cough, unspecified: Secondary | ICD-10-CM | POA: Diagnosis not present

## 2021-10-31 DIAGNOSIS — M1711 Unilateral primary osteoarthritis, right knee: Secondary | ICD-10-CM | POA: Diagnosis not present

## 2021-10-31 DIAGNOSIS — M25561 Pain in right knee: Secondary | ICD-10-CM | POA: Diagnosis not present

## 2021-11-22 ENCOUNTER — Encounter: Payer: Self-pay | Admitting: Internal Medicine

## 2021-11-22 MED ORDER — AMLODIPINE BESYLATE 10 MG PO TABS: 10.0000 mg | ORAL_TABLET | Freq: Every day | ORAL | 1 refills | Status: DC

## 2021-12-15 ENCOUNTER — Other Ambulatory Visit: Payer: Self-pay | Admitting: Internal Medicine

## 2021-12-17 MED ORDER — RALOXIFENE HCL 60 MG PO TABS: 60.0000 mg | ORAL_TABLET | Freq: Every day | ORAL | 3 refills | Status: DC

## 2021-12-23 ENCOUNTER — Ambulatory Visit
Admission: RE | Admit: 2021-12-23 | Discharge: 2021-12-23 | Disposition: A | Discharge status: Home / Self Care | Payer: Medicare Other | Source: Ambulatory Visit | Attending: Internal Medicine | Admitting: Internal Medicine

## 2021-12-23 ENCOUNTER — Other Ambulatory Visit: Payer: Self-pay

## 2021-12-23 DIAGNOSIS — M8589 Other specified disorders of bone density and structure, multiple sites: Secondary | ICD-10-CM | POA: Diagnosis not present

## 2021-12-23 DIAGNOSIS — M810 Age-related osteoporosis without current pathological fracture: Secondary | ICD-10-CM | POA: Insufficient documentation

## 2021-12-23 DIAGNOSIS — Z780 Asymptomatic menopausal state: Secondary | ICD-10-CM | POA: Diagnosis not present

## 2021-12-26 DIAGNOSIS — R399 Unspecified symptoms and signs involving the genitourinary system: Secondary | ICD-10-CM | POA: Diagnosis not present

## 2021-12-26 DIAGNOSIS — N390 Urinary tract infection, site not specified: Secondary | ICD-10-CM | POA: Diagnosis not present

## 2022-01-03 ENCOUNTER — Other Ambulatory Visit: Payer: Self-pay | Admitting: Internal Medicine

## 2022-01-30 DIAGNOSIS — I10 Essential (primary) hypertension: Secondary | ICD-10-CM | POA: Diagnosis not present

## 2022-01-30 DIAGNOSIS — E7800 Pure hypercholesterolemia, unspecified: Secondary | ICD-10-CM | POA: Diagnosis not present

## 2022-01-30 DIAGNOSIS — I351 Nonrheumatic aortic (valve) insufficiency: Secondary | ICD-10-CM | POA: Diagnosis not present

## 2022-02-10 DIAGNOSIS — I351 Nonrheumatic aortic (valve) insufficiency: Secondary | ICD-10-CM | POA: Diagnosis not present

## 2022-02-11 DIAGNOSIS — I351 Nonrheumatic aortic (valve) insufficiency: Secondary | ICD-10-CM | POA: Diagnosis not present

## 2022-02-11 DIAGNOSIS — Z1211 Encounter for screening for malignant neoplasm of colon: Secondary | ICD-10-CM | POA: Diagnosis not present

## 2022-02-11 DIAGNOSIS — I10 Essential (primary) hypertension: Secondary | ICD-10-CM | POA: Diagnosis not present

## 2022-03-05 DIAGNOSIS — I351 Nonrheumatic aortic (valve) insufficiency: Secondary | ICD-10-CM | POA: Diagnosis not present

## 2022-03-05 DIAGNOSIS — E7800 Pure hypercholesterolemia, unspecified: Secondary | ICD-10-CM | POA: Diagnosis not present

## 2022-03-05 DIAGNOSIS — I10 Essential (primary) hypertension: Secondary | ICD-10-CM | POA: Diagnosis not present

## 2022-03-18 DIAGNOSIS — Z980 Intestinal bypass and anastomosis status: Secondary | ICD-10-CM | POA: Diagnosis not present

## 2022-03-18 DIAGNOSIS — Z1211 Encounter for screening for malignant neoplasm of colon: Secondary | ICD-10-CM | POA: Diagnosis not present

## 2022-03-18 LAB — HM COLONOSCOPY

## 2022-03-20 ENCOUNTER — Other Ambulatory Visit (INDEPENDENT_AMBULATORY_CARE_PROVIDER_SITE_OTHER): Payer: Medicare Other

## 2022-03-20 DIAGNOSIS — E538 Deficiency of other specified B group vitamins: Secondary | ICD-10-CM

## 2022-03-20 DIAGNOSIS — I10 Essential (primary) hypertension: Secondary | ICD-10-CM | POA: Diagnosis not present

## 2022-03-20 LAB — B12 AND FOLATE PANEL
Folate: >24.2 ng/mL (ref >5.9)
Vitamin B-12: 914 pg/mL — ABNORMAL HIGH (ref 211–911)

## 2022-03-20 LAB — COMPREHENSIVE METABOLIC PANEL
ALT: 30 U/L (ref 0–35)
AST: 35 U/L (ref 0–37)
Albumin: 4.2 g/dL (ref 3.5–5.2)
Alkaline Phosphatase: 45 U/L (ref 39–117)
BUN: 13 mg/dL (ref 6–23)
CO2: 28 mEq/L (ref 19–32)
Calcium: 9.3 mg/dL (ref 8.4–10.5)
Chloride: 106 mEq/L (ref 96–112)
Creatinine, Ser: 0.61 mg/dL (ref 0.40–1.20)
GFR: 87.99 mL/min (ref >60.00)
Glucose, Bld: 93 mg/dL (ref 70–99)
Potassium: 3.7 mEq/L (ref 3.5–5.1)
Sodium: 142 mEq/L (ref 135–145)
Total Bilirubin: 0.5 mg/dL (ref 0.2–1.2)
Total Protein: 6.7 g/dL (ref 6.0–8.3)

## 2022-03-24 ENCOUNTER — Ambulatory Visit (INDEPENDENT_AMBULATORY_CARE_PROVIDER_SITE_OTHER): Payer: Medicare Other | Admitting: Internal Medicine

## 2022-03-24 ENCOUNTER — Encounter: Payer: Self-pay | Admitting: Internal Medicine

## 2022-03-24 DIAGNOSIS — I10 Essential (primary) hypertension: Secondary | ICD-10-CM | POA: Diagnosis not present

## 2022-03-24 DIAGNOSIS — M810 Age-related osteoporosis without current pathological fracture: Secondary | ICD-10-CM

## 2022-03-24 DIAGNOSIS — G8929 Other chronic pain: Secondary | ICD-10-CM

## 2022-03-24 DIAGNOSIS — I351 Nonrheumatic aortic (valve) insufficiency: Secondary | ICD-10-CM

## 2022-03-24 DIAGNOSIS — M25561 Pain in right knee: Secondary | ICD-10-CM

## 2022-03-24 DIAGNOSIS — I872 Venous insufficiency (chronic) (peripheral): Secondary | ICD-10-CM | POA: Diagnosis not present

## 2022-03-24 NOTE — Progress Notes (Addendum)
? ?Subjective:  ?Patient ID: Shannon Hickman, female    DOB: 1946-11-18  Age: 76 y.o. MRN: [id number] ? ?CC: Diagnoses of Nonrheumatic aortic valve insufficiency, Essential hypertension, Age-related osteoporosis without current pathological fracture, Chronic pain of right knee, and Chronic venous insufficiency of lower extremity were pertinent to this visit. ? ? ?This visit occurred during the SARS-CoV-2 public health emergency.  Safety protocols were in place, including screening questions prior to the visit, additional usage of staff PPE, and extensive cleaning of exam room while observing appropriate contact time as indicated for disinfecting solutions.   ? ?HPI ?[name] presents for  ?Chief Complaint  ?Patient presents with  ? Follow-up  ?  6 month follow up  ? ?1) Osteoporosis:  reviewed Jan  2023  DEXA .  Has been taking raloxifene since July 2021.   ? ?2)  HTN:  Patient is taking her medications as prescribed and notes no adverse effects.  Home BP readings have NOT BEEN DONE IN A WHILE  but office readings have been elevated beyond 130/80 .  She is avoiding added salt in her diet and walking regularly about 3 times per week for exercise  .  ? ? ? ?Outpatient Medications Prior to Visit  ?Medication Sig Dispense Refill  ? acetaminophen (TYLENOL) 325 MG tablet Take 650 mg by mouth every 6 (six) hours as needed.    ? amLODipine (NORVASC) 10 MG tablet Take 1 tablet (10 mg total) by mouth daily. 90 tablet 1  ? atorvastatin (LIPITOR) 40 MG tablet Take 1 tablet by mouth once daily 90 tablet 3  ? CALCIUM PO Take by mouth daily.    ? cetirizine (ZYRTEC) 10 MG tablet Take 10 mg by mouth daily.     ? Coenzyme Q10 (COQ10) 100 MG CAPS Take 1 capsule by mouth daily.    ? Multiple Vitamins-Minerals (CENTRUM SILVER ULTRA WOMENS PO) Take 1 tablet by mouth daily.    ? raloxifene (EVISTA) 60 MG tablet Take 1 tablet (60 mg total) by mouth daily. 90 tablet 3  ? vitamin B-12 (CYANOCOBALAMIN) 1000 MCG tablet Take 1,000 mcg by  mouth daily.    ? ?No facility-administered medications prior to visit.  ? ? ?Review of Systems; ? ?Patient denies headache, fevers, malaise, unintentional weight loss, skin rash, eye pain, sinus congestion and sinus pain, sore throat, dysphagia,  hemoptysis , cough, dyspnea, wheezing, chest pain, palpitations, orthopnea, edema, abdominal pain, nausea, melena, diarrhea, constipation, flank pain, dysuria, hematuria, urinary  Frequency, nocturia, numbness, tingling, seizures,  Focal weakness, Loss of consciousness,  Tremor, insomnia, depression, anxiety, and suicidal ideation.   ? ? ? ?Objective:  ?BP (!) 146/70 (BP Location: Left Arm, Patient Position: Sitting, Cuff Size: Normal)   Pulse 79   Temp 98.1 ?F (36.7 ?C) (Oral)   Ht '5\' 4"'$  (1.626 m)   Wt 147 lb 9.6 oz (67 kg)   SpO2 97%   BMI 25.34 kg/m?  ? ?BP Readings from Last 3 Encounters:  ?03/24/22 (!) 146/70  ?09/20/21 130/72  ?06/26/21 133/65  ? ? ?Wt Readings from Last 3 Encounters:  ?03/24/22 147 lb 9.6 oz (67 kg)  ?09/20/21 149 lb 9.6 oz (67.9 kg)  ?08/05/21 148 lb (67.1 kg)  ? ? ?General appearance: alert, cooperative and appears stated age ?Ears: normal TM's and external ear canals both ears ?Throat: lips, mucosa, and tongue normal; teeth and gums normal ?Neck: no adenopathy, no carotid bruit, supple, symmetrical, trachea midline and thyroid not enlarged, symmetric, no tenderness/mass/nodules ?  Back: symmetric, no curvature. ROM normal. No CVA tenderness. ?Lungs: clear to auscultation bilaterally ?Heart: regular rate and rhythm, S1, S2 normal, no murmur, click, rub or gallop ?Abdomen: soft, non-tender; bowel sounds normal; no masses,  no organomegaly ?Pulses: 2+ and symmetric ?Skin: Skin color, texture, turgor normal. No rashes or lesions ?Lymph nodes: Cervical, supraclavicular, and axillary nodes normal. ? ?Lab Results  ?Component Value Date  ? HGBA1C 5.3 09/16/2021  ? ? ?Lab Results  ?Component Value Date  ? CREATININE 0.61 03/20/2022  ? CREATININE 0.74  09/16/2021  ? CREATININE 0.7 09/27/2020  ? ? ?Lab Results  ?Component Value Date  ? WBC 4.2 09/16/2021  ? HGB 13.6 09/16/2021  ? HCT 40.9 09/16/2021  ? PLT 188.0 09/16/2021  ? GLUCOSE 93 03/20/2022  ? CHOL 158 09/16/2021  ? TRIG 68.0 09/16/2021  ? HDL 72.30 09/16/2021  ? LDLDIRECT 104 (H) 10/14/2019  ? LDLCALC 72 09/16/2021  ? ALT 30 03/20/2022  ? AST 35 03/20/2022  ? NA 142 03/20/2022  ? K 3.7 03/20/2022  ? CL 106 03/20/2022  ? CREATININE 0.61 03/20/2022  ? BUN 13 03/20/2022  ? CO2 28 03/20/2022  ? TSH 0.44 10/14/2019  ? HGBA1C 5.3 09/16/2021  ? MICROALBUR <0.7 09/16/2021  ? ? ?DG Bone Density ? ?Result Date: 12/23/2021 ?EXAM: DUAL X-RAY ABSORPTIOMETRY (DXA) FOR BONE MINERAL DENSITY IMPRESSION: Your patient Aysha Livecchi completed a BMD test on 12/23/2021 using the [place] (software version: 14.10) manufactured by [employer]. The following summarizes the results of our evaluation. Technologist: MTB PATIENT BIOGRAPHICAL: Name: Kaydince, Towles I Patient ID: [id number] Birth Date: 1946/02/13 Height: 64.0 in. Gender: Female Exam Date: 12/23/2021 Weight: 147.0 lbs. Indications: Caucasian, Family Hx of Osteoporosis, Postmenopausal, Osteoarthritis Fractures: Treatments: Calcium, Evista, Multi-Vitamin DENSITOMETRY RESULTS: Site         Region     Measured Date Measured Age WHO Classification Young Adult T-score BMD         %Change vs. Previous Significant Change (*) AP Spine L1-L2 12/23/2021 74.3 Osteopenia -1.9 0.940 g/cm2 1.2% - AP Spine L1-L2 12/19/2019 72.3 Osteopenia -2.0 0.929 g/cm2 -2.5% - AP Spine L1-L2 10/22/2016 69.1 Osteopenia -1.8 0.953 g/cm2 - - DualFemur Neck Left 12/23/2021 74.3 Osteoporosis -2.5 0.694 g/cm2 -4.5% - DualFemur Neck Left 12/19/2019 72.3 Osteopenia -2.2 0.727 g/cm2 -1.9% - DualFemur Neck Left 10/22/2016 69.1 Osteopenia -2.1 0.741 g/cm2 - - DualFemur Total Mean 12/23/2021 74.3 Osteopenia -1.9 0.772 g/cm2 -1.2% - DualFemur Total Mean 12/19/2019 72.3 Osteopenia -1.8 0.781  g/cm2 -4.3% Yes DualFemur Total Mean 10/22/2016 69.1 Osteopenia -1.5 0.816 g/cm2 - - Left Forearm Radius 33% 12/23/2021 74.3 Osteopenia -1.6 0.737 g/cm2 -1.2% - Left Forearm Radius 33% 12/19/2019 72.3 Osteopenia -1.5 0.746 g/cm2 - - ASSESSMENT: The BMD measured at Femur Neck Left is 0.694 g/cm2 with a T-score of -2.5. This patient is considered osteoporotic according to [facility]) criteria. The scan quality is good. L-3 and L-4 were excluded due to degenerative changes. Compared with prior study, there has been no significant change in the spine. Compared with prior study, there has been no significant change in the total hip. World Pharmacologist [facility]) criteria for post-menopausal, Caucasian Women: Normal:                   T-score at or above -1 SD Osteopenia/low bone mass: T-score between -1 and -2.5 SD Osteoporosis:             T-score at or below -2.5 SD RECOMMENDATIONS:  1. All patients should optimize calcium and vitamin D intake. 2. Consider FDA-approved medical therapies in postmenopausal women and men aged 30 years and older, based on the following: a. A hip or vertebral(clinical or morphometric) fracture b. T-score < -2.5 at the femoral neck or spine after appropriate evaluation to exclude secondary causes c. Low bone mass (T-score between -1.0 and -2.5 at the femoral neck or spine) and a 10-year probability of a hip fracture > 3% or a 10-year probability of a major osteoporosis-related fracture > 20% based on the [place]-adapted WHO algorithm 3. Clinician judgment and/or patient preferences may indicate treatment for people with 10-year fracture probabilities above or below these levels FOLLOW-UP: People with diagnosed cases of osteoporosis or at high risk for fracture should have regular bone mineral density tests. For patients eligible for Medicare, routine testing is allowed once every 2 years. The testing frequency can be increased to one year for patients who have rapidly  progressing disease, those who are receiving or discontinuing medical therapy to restore bone mass, or have additional risk factors. I have reviewed this report, and agree with the above findings. [facility]

## 2022-03-24 NOTE — Assessment & Plan Note (Signed)
Secondary to OA per Orthopedics.  encouraged to walk daily  ?

## 2022-03-24 NOTE — Assessment & Plan Note (Signed)
Treated with Evista since late July  2021.  Will repeat DEXA in 2024  ?

## 2022-03-24 NOTE — Assessment & Plan Note (Signed)
Managed with compression stockings worn daily.  May need to change BP medication from amlodipine to another if home readings are elevation  ?

## 2022-03-24 NOTE — Patient Instructions (Signed)
YOUR BP READINGS ARE BORDERLINE ? ? Please check your blood pressure a few times at home on your home machine,  and return for a [place]   ? ?WE WILL GIVE RALOXIFENE ONE MORE YEAR AND TO START IMPROVING YOUR T SCORES  ? ?

## 2022-03-24 NOTE — Assessment & Plan Note (Signed)
Diagnosed By ECHO March 2021 ([employer] ER follow up on syncopal episode that occurred during a hot shower.  Repeat ECHO Feb 2023 notes no significant change  ?

## 2022-03-24 NOTE — Assessment & Plan Note (Addendum)
she reports compliance with amlodipine 10 mg daily   but has several elevated office readings . She is not using NSAIDs daily.  Discussed goal of 120/70  (130/80 for patients over 70)  to preserve renal function.  She has been asked to check her  BP  at home and return for a nurse visit with home machineenal function, electrolytes and screen for proteinuria have all been normal.  She has chronic venous insufficiency  managed with compression stockings worn daily.  May need to change BP medication from amlodipine to another if home readings are elevated ? ?

## 2022-03-26 ENCOUNTER — Encounter: Payer: Self-pay | Admitting: Internal Medicine

## 2022-04-08 ENCOUNTER — Ambulatory Visit (INDEPENDENT_AMBULATORY_CARE_PROVIDER_SITE_OTHER): Payer: Medicare Other

## 2022-04-08 DIAGNOSIS — I10 Essential (primary) hypertension: Secondary | ICD-10-CM | POA: Diagnosis not present

## 2022-04-08 NOTE — Progress Notes (Signed)
Patient came in today to check her current blood pressure Reli-on machine verses what we got here in office. Pt's reading with cuff was 134/70 P 76. When I checked manually with normal sized cuff BP was 122/64 P 81. Since the systolic was a difference of more than 10 points patient advised that she may need to purchase more updated cuff. I did provide the the name Omron as a recommended brand & advised on places that she would be able to buy this brand machine.  ?

## 2022-04-16 ENCOUNTER — Encounter: Payer: Self-pay | Admitting: Internal Medicine

## 2022-04-17 ENCOUNTER — Ambulatory Visit (INDEPENDENT_AMBULATORY_CARE_PROVIDER_SITE_OTHER): Payer: Medicare Other | Admitting: Internal Medicine

## 2022-04-17 ENCOUNTER — Encounter: Payer: Self-pay | Admitting: Internal Medicine

## 2022-04-17 VITALS — BP 150/74 | HR 77 | Temp 97.9°F | Ht 64.0 in | Wt 148.0 lb

## 2022-04-17 DIAGNOSIS — L719 Rosacea, unspecified: Secondary | ICD-10-CM

## 2022-04-17 MED ORDER — METRONIDAZOLE 0.75 % EX GEL: 1.0000 "application " | Freq: Two times a day (BID) | CUTANEOUS | 2 refills | Status: AC

## 2022-04-17 NOTE — Patient Instructions (Signed)
I believe you have rosacea.  I would like you to  try using  metrogel  : apply twice daily a  clean face  ? ?Continue Zyrtec, but add an evening dose for your eyes ? ?Referral to [place] dermatology in progress ? ? ? ?

## 2022-04-17 NOTE — Progress Notes (Signed)
? ?Subjective:  ?Patient ID: Shannon Hickman, female    DOB: 1946-08-24  Age: 76 y.o. MRN: [id number] ? ?CC: The encounter diagnosis was Rosacea. ? ? ?This visit occurred during the SARS-CoV-2 public health emergency.  Safety protocols were in place, including screening questions prior to the visit, additional usage of staff PPE, and extensive cleaning of exam room while observing appropriate contact time as indicated for disinfecting solutions.   ? ?HPI ?Shannon Hickman presents for evaluation of a persistent facial rash  ?Chief Complaint  ?Patient presents with  ? Rash  ?  Rash on face. It started about 6 months ago but has been getting worse over time.   ? ? ?76 yr old female presents with 6 month history of erythematous papular covering bilateral cheeks, chin, that has not resolved or spread.  It is aggravated by wine  spicy foods.  Does not itch.feels that her conjunctiva have been affected as well   ? ? ?Outpatient Medications Prior to Visit  ?Medication Sig Dispense Refill  ? acetaminophen (TYLENOL) 325 MG tablet Take 650 mg by mouth every 6 (six) hours as needed.    ? amLODipine (NORVASC) 10 MG tablet Take 1 tablet (10 mg total) by mouth daily. 90 tablet 1  ? atorvastatin (LIPITOR) 40 MG tablet Take 1 tablet by mouth once daily 90 tablet 3  ? CALCIUM PO Take by mouth daily.    ? cetirizine (ZYRTEC) 10 MG tablet Take 10 mg by mouth daily.     ? Coenzyme Q10 (COQ10) 100 MG CAPS Take 1 capsule by mouth daily.    ? Multiple Vitamins-Minerals (CENTRUM SILVER ULTRA WOMENS PO) Take 1 tablet by mouth daily.    ? raloxifene (EVISTA) 60 MG tablet Take 1 tablet (60 mg total) by mouth daily. 90 tablet 3  ? vitamin B-12 (CYANOCOBALAMIN) 1000 MCG tablet Take 1,000 mcg by mouth daily.    ? ?No facility-administered medications prior to visit.  ? ? ?Review of Systems; ? ?Patient denies headache, fevers, malaise, unintentional weight loss,  eye pain, sinus congestion and sinus pain, sore throat, dysphagia,  hemoptysis , cough,  dyspnea, wheezing, chest pain, palpitations, orthopnea, edema, abdominal pain, nausea, melena, diarrhea, constipation, flank pain, dysuria, hematuria, urinary  Frequency, nocturia, numbness, tingling, seizures,  Focal weakness, Loss of consciousness,  Tremor, insomnia, depression, anxiety, and suicidal ideation.   ? ? ? ?Objective:  ?BP (!) 150/74 (BP Location: Left Arm, Patient Position: Sitting, Cuff Size: Normal)   Pulse 77   Temp 97.9 ?F (36.6 ?C) (Oral)   Ht '5\' 4"'$  (1.626 m)   Wt 148 lb (67.1 kg)   SpO2 97%   BMI 25.40 kg/m?  ? ?BP Readings from Last 3 Encounters:  ?04/17/22 (!) 150/74  ?03/24/22 (!) 146/70  ?09/20/21 130/72  ? ? ?Wt Readings from Last 3 Encounters:  ?04/17/22 148 lb (67.1 kg)  ?03/24/22 147 lb 9.6 oz (67 kg)  ?09/20/21 149 lb 9.6 oz (67.9 kg)  ? ? ?General appearance: alert, cooperative and appears stated age ?Ears: normal TM's and external ear canals both ears ?Throat: lips, mucosa, and tongue normal; teeth and gums normal ?Face : bilateral inflammatory pustules covering forehead, cheeks and chin ?Neck: no adenopathy, no carotid bruit, supple, symmetrical, trachea midline and thyroid not enlarged, symmetric, no tenderness/mass/nodules ?Back: symmetric, no curvature. ROM normal. No CVA tenderness. ?Lungs: clear to auscultation bilaterally ?Heart: regular rate and rhythm, S1, S2 normal, no murmur, click, rub or gallop ?Abdomen: soft, non-tender; bowel sounds normal;  no masses,  no organomegaly ?Pulses: 2+ and symmetric ?Skin: Skin color, texture, turgor normal. No rashes or lesions ?Lymph nodes: Cervical, supraclavicular, and axillary nodes normal. ? ?Lab Results  ?Component Value Date  ? HGBA1C 5.3 09/16/2021  ? ? ?Lab Results  ?Component Value Date  ? CREATININE 0.61 03/20/2022  ? CREATININE 0.74 09/16/2021  ? CREATININE 0.7 09/27/2020  ? ? ?Lab Results  ?Component Value Date  ? WBC 4.2 09/16/2021  ? HGB 13.6 09/16/2021  ? HCT 40.9 09/16/2021  ? PLT 188.0 09/16/2021  ? GLUCOSE 93  03/20/2022  ? CHOL 158 09/16/2021  ? TRIG 68.0 09/16/2021  ? HDL 72.30 09/16/2021  ? LDLDIRECT 104 (H) 10/14/2019  ? LDLCALC 72 09/16/2021  ? ALT 30 03/20/2022  ? AST 35 03/20/2022  ? NA 142 03/20/2022  ? K 3.7 03/20/2022  ? CL 106 03/20/2022  ? CREATININE 0.61 03/20/2022  ? BUN 13 03/20/2022  ? CO2 28 03/20/2022  ? TSH 0.44 10/14/2019  ? HGBA1C 5.3 09/16/2021  ? MICROALBUR <0.7 09/16/2021  ? ? ?DG Bone Density ? ?Result Date: 12/23/2021 ?EXAM: DUAL X-RAY ABSORPTIOMETRY (DXA) FOR BONE MINERAL DENSITY IMPRESSION: Your patient Krissie Merrick completed a BMD test on 12/23/2021 using the [place] (software version: 14.10) manufactured by [employer]. The following summarizes the results of our evaluation. Technologist: MTB PATIENT BIOGRAPHICAL: Name: Cathie, Bonnell I Patient ID: [id number] Birth Date: February 27, 1946 Height: 64.0 in. Gender: Female Exam Date: 12/23/2021 Weight: 147.0 lbs. Indications: Caucasian, Family Hx of Osteoporosis, Postmenopausal, Osteoarthritis Fractures: Treatments: Calcium, Evista, Multi-Vitamin DENSITOMETRY RESULTS: Site         Region     Measured Date Measured Age WHO Classification Young Adult T-score BMD         %Change vs. Previous Significant Change (*) AP Spine L1-L2 12/23/2021 74.3 Osteopenia -1.9 0.940 g/cm2 1.2% - AP Spine L1-L2 12/19/2019 72.3 Osteopenia -2.0 0.929 g/cm2 -2.5% - AP Spine L1-L2 10/22/2016 69.1 Osteopenia -1.8 0.953 g/cm2 - - DualFemur Neck Left 12/23/2021 74.3 Osteoporosis -2.5 0.694 g/cm2 -4.5% - DualFemur Neck Left 12/19/2019 72.3 Osteopenia -2.2 0.727 g/cm2 -1.9% - DualFemur Neck Left 10/22/2016 69.1 Osteopenia -2.1 0.741 g/cm2 - - DualFemur Total Mean 12/23/2021 74.3 Osteopenia -1.9 0.772 g/cm2 -1.2% - DualFemur Total Mean 12/19/2019 72.3 Osteopenia -1.8 0.781 g/cm2 -4.3% Yes DualFemur Total Mean 10/22/2016 69.1 Osteopenia -1.5 0.816 g/cm2 - - Left Forearm Radius 33% 12/23/2021 74.3 Osteopenia -1.6 0.737 g/cm2 -1.2% - Left Forearm Radius 33%  12/19/2019 72.3 Osteopenia -1.5 0.746 g/cm2 - - ASSESSMENT: The BMD measured at Femur Neck Left is 0.694 g/cm2 with a T-score of -2.5. This patient is considered osteoporotic according to [facility]) criteria. The scan quality is good. L-3 and L-4 were excluded due to degenerative changes. Compared with prior study, there has been no significant change in the spine. Compared with prior study, there has been no significant change in the total hip. World Pharmacologist [facility]) criteria for post-menopausal, Caucasian Women: Normal:                   T-score at or above -1 SD Osteopenia/low bone mass: T-score between -1 and -2.5 SD Osteoporosis:             T-score at or below -2.5 SD RECOMMENDATIONS: 1. All patients should optimize calcium and vitamin D intake. 2. Consider FDA-approved medical therapies in postmenopausal women and men aged 54 years and older, based on the following: a. A hip or vertebral(clinical  or morphometric) fracture b. T-score < -2.5 at the femoral neck or spine after appropriate evaluation to exclude secondary causes c. Low bone mass (T-score between -1.0 and -2.5 at the femoral neck or spine) and a 10-year probability of a hip fracture > 3% or a 10-year probability of a major osteoporosis-related fracture > 20% based on the [place]-adapted WHO algorithm 3. Clinician judgment and/or patient preferences may indicate treatment for people with 10-year fracture probabilities above or below these levels FOLLOW-UP: People with diagnosed cases of osteoporosis or at high risk for fracture should have regular bone mineral density tests. For patients eligible for Medicare, routine testing is allowed once every 2 years. The testing frequency can be increased to one year for patients who have rapidly progressing disease, those who are receiving or discontinuing medical therapy to restore bone mass, or have additional risk factors. I have reviewed this report, and agree with the above  findings. [facility] Electronically Signed   By: Elmer Picker M.D.   On: 12/23/2021 10:17  ? ? ?Assessment & Plan:  ? ?Problem List Items Addressed This Visit   ? ? Rosacea - Primary  ?  Trial of topi

## 2022-04-20 DIAGNOSIS — L719 Rosacea, unspecified: Secondary | ICD-10-CM | POA: Insufficient documentation

## 2022-04-20 NOTE — Assessment & Plan Note (Signed)
Trial of topical metronidazole ,  And cool compresses/lubricating liquids for the conjuncitval component  Dermatology referral in progress ?

## 2022-05-25 ENCOUNTER — Other Ambulatory Visit: Payer: Self-pay | Admitting: Internal Medicine

## 2022-07-07 DIAGNOSIS — M545 Low back pain, unspecified: Secondary | ICD-10-CM | POA: Diagnosis not present

## 2022-07-07 DIAGNOSIS — I10 Essential (primary) hypertension: Secondary | ICD-10-CM | POA: Diagnosis not present

## 2022-08-06 ENCOUNTER — Ambulatory Visit (INDEPENDENT_AMBULATORY_CARE_PROVIDER_SITE_OTHER): Payer: Medicare Other

## 2022-08-06 VITALS — Ht 64.0 in | Wt 148.0 lb

## 2022-08-06 DIAGNOSIS — Z0000 Encounter for general adult medical examination without abnormal findings: Secondary | ICD-10-CM

## 2022-08-06 NOTE — Progress Notes (Addendum)
Subjective:   Shannon Hickman is a 76 y.o. female who presents for Medicare Annual (Subsequent) preventive examination.  Review of Systems    No ROS.  Medicare Wellness Virtual Visit.  Visual/audio telehealth visit, UTA vital signs.   See social history for additional risk factors.   Cardiac Risk Factors include: advanced age (>87mn, >>1women);hypertension     Objective:    Today's Vitals   08/06/22 1118  Weight: 148 lb (67.1 kg)  Height: '5\' 4"'$  (1.626 m)   Body mass index is 25.4 kg/m.     08/06/2022   11:20 AM 08/05/2021    9:04 AM 06/26/2021   10:41 AM 08/02/2020    8:46 AM 02/22/2020    9:50 AM 08/02/2019    8:41 AM 01/08/2017   10:03 AM  Advanced Directives  Does Patient Have a Medical Advance Directive? Yes Yes Yes Yes No Yes Yes  Type of AParamedicof ALewis and Clark VillageLiving will [place]ivergro[place]Living will [place]Living will [place]Living will  [place]roquoisLiving will HLa SalleLiving will  Does patient want to make changes to medical advance directive? No - Patient declined No - Patient declined No - Patient declined No - Patient declined  No - Patient declined   Copy of HNarberthin Chart? No - copy requested No - copy requested Yes - validated most recent copy scanned in chart (See row information) No - copy requested  No - copy requested     Current Medications (verified) Outpatient Encounter Medications as of 08/06/2022  Medication Sig   acetaminophen (TYLENOL) 325 MG tablet Take 650 mg by mouth every 6 (six) hours as needed.   amLODipine (NORVASC) 10 MG tablet Take 1 tablet by mouth once daily   atorvastatin (LIPITOR) 40 MG tablet Take 1 tablet by mouth once daily   CALCIUM PO Take by mouth daily.   cetirizine (ZYRTEC) 10 MG tablet Take 10 mg by mouth daily.    Coenzyme Q10 (COQ10) 100 MG CAPS Take 1 capsule by mouth daily.   Multiple  Vitamins-Minerals (CENTRUM SILVER ULTRA WOMENS PO) Take 1 tablet by mouth daily.   raloxifene (EVISTA) 60 MG tablet Take 1 tablet (60 mg total) by mouth daily.   vitamin B-12 (CYANOCOBALAMIN) 1000 MCG tablet Take 1,000 mcg by mouth daily.   No facility-administered encounter medications on file as of 08/06/2022.    Allergies (verified) Minocycline   History: Past Medical History:  Diagnosis Date   Chicken pox    Diverticulitis    Heart murmur    High cholesterol    Hypertension    Inflammatory polyps of colon (HWailea    Migraine    Urinary tract infection    Past Surgical History:  Procedure Laterality Date   APPENDECTOMY  1998   BUNIONECTOMY Left 01/08/2017   Procedure: Arthrodesis Metatarsalphalangeal joint MTPJ Left;  Surgeon: MAlbertine Patricia DPM;  [facility]  Service: Podiatry;  Laterality: Left;   BUNIONECTOMY Right 06/26/2021   Procedure: BUNIONECTOMY- LAPIDUS TYPE, PHALANX OSTEOTOMY- AKIN;  Surgeon: FSamara Deist DPM;  [facility]  Service: Podiatry;  Laterality: Right;   HISTOPLASMOSIS     KNEE ARTHROSCOPY Left 08/02/2015   Procedure: ARTHROSCOPY KNEE, PARTIAL MEDIAL MENISCECTOMY;  Surgeon: MHessie Knows MD;  Location: [facility];  Service: Orthopedics;  Laterality: Left;   PARTIAL COLECTOMY     diverticular rupture, WRochel Brome   Family History  Problem  Relation Age of Onset   Heart disease Father 23   Diabetes Father 9   Stomach cancer Maternal Grandfather    Cancer Neg Hx    Social History   Socioeconomic History   Marital status: Married    Spouse name: Not on file   Number of children: Not on file   Years of education: Not on file   Highest education level: Not on file  Occupational History   Not on file  Tobacco Use   Smoking status: Never   Smokeless tobacco: Never  Vaping Use   Vaping Use: Never used  Substance and Sexual Activity   Alcohol use: No   Drug use: Not on file   Sexual activity: Yes  Other  Topics Concern   Not on file  Social History Narrative   Not on file   Social Determinants of Health   Financial Resource Strain: Low Risk  (08/06/2022)   Overall Financial Resource Strain (CARDIA)    Difficulty of Paying Living Expenses: Not hard at all  Food Insecurity: No Food Insecurity (08/06/2022)   Hunger Vital Sign    Worried About Running Out of Food in the Last Year: Never true    [place] in the Last Year: Never true  Transportation Needs: No Transportation Needs (08/06/2022)   PRAPARE - Hydrologist (Medical): No    Lack of Transportation (Non-Medical): No  Physical Activity: Insufficiently Active (08/06/2022)   Exercise Vital Sign    Days of Exercise per Week: 2 days    Minutes of Exercise per Session: 20 min  Stress: No Stress Concern Present (08/06/2022)   [place]    Feeling of Stress : Not at all  Social Connections: [place] (08/06/2022)   Social Connection and Isolation Panel [NHANES]    Frequency of Communication with Friends and Family: More than three times a week    Frequency of Social Gatherings with Friends and Family: More than three times a week    Attends Religious Services: More than 4 times per year    Active Member of [employer] or Organizations: Yes    Attends Music therapist: Not on file    Marital Status: Married    Tobacco Counseling Counseling given: Not Answered  Clinical Intake: Pre-visit preparation completed: Yes        Diabetes: No  How often do you need to have someone help you when you read instructions, pamphlets, or other written materials from your doctor or pharmacy?: 1 - Never   Interpreter Needed?: No    Activities of Daily Living    08/06/2022   11:24 AM  In your present state of health, do you have any difficulty performing the following activities:  Hearing? 0  Vision? 0  Difficulty  concentrating or making decisions? 0  Walking or climbing stairs? 0  Dressing or bathing? 0  Doing errands, shopping? 0  Preparing Food and eating ? N  Using the Toilet? N  In the past six months, have you accidently leaked urine? N  Do you have problems with loss of bowel control? N  Managing your Medications? N  Managing your Finances? N  Housekeeping or managing your Housekeeping? N   Patient Care Team: Crecencio Mc, MD as PCP - General (Internal Medicine)  Indicate any recent Medical Services you may have received from other than [facility] providers in the past year (date may be approximate).  Assessment:   This is a routine wellness examination for Lennette.  Virtual Visit via Telephone Note  I connected with  [name] on 08/06/22 at 11:15 AM EDT by telephone and verified that I am speaking with the correct person using two identifiers.  Location: Patient: home Provider: office Persons participating in the virtual visit: patient/Nurse Health Advisor   I discussed the limitations of performing an evaluation and management service by telehealth. We continued and completed visit with audio only. Some vital signs may be absent or patient reported.   Hearing/Vision screen Hearing Screening - Comments:: Patient is able to hear conversational tones without difficulty. No issues reported. Vision Screening - Comments:: Followed by [facility]  Wears corrective lenses They have seen their ophthalmologist in the last 12 months.   Dietary issues and exercise activities discussed: Current Exercise Habits: Home exercise routine, Intensity: Mild Regular diet Good water intake   Goals Addressed             This Visit's Progress    Maintain Healthy Lifestyle       Stay active/increase physical activity Healthy diet       Depression Screen    08/06/2022   11:21 AM 04/17/2022   11:04 AM 03/24/2022   10:29 AM 09/20/2021   10:09 AM 08/05/2021    8:40 AM 08/02/2020     8:43 AM 08/02/2019    8:42 AM  PHQ 2/9 Scores  PHQ - 2 Score 0 0 0 0 0 0 0    Fall Risk    08/06/2022   11:24 AM 04/17/2022   11:04 AM 03/24/2022   10:29 AM 09/20/2021   10:09 AM 08/05/2021    9:05 AM  Fall Risk   Falls in the past year? 0 0 0 0 0  Number falls in past yr:     0  Injury with Fall?     0  Risk for fall due to :  No Fall Risks No Fall Risks    Follow up Falls evaluation completed Falls evaluation completed Falls evaluation completed Falls evaluation completed Falls evaluation completed    [place]: Home free of loose throw rugs in walkways, pet beds, electrical cords, etc? Yes  Adequate lighting in your home to reduce risk of falls? Yes   ASSISTIVE DEVICES UTILIZED TO PREVENT FALLS: Life alert? No  Use of a cane, walker or w/c? No   TIMED UP AND GO: Was the test performed? No .   Cognitive Function:  Patient is alert and alert and oriented x3.  Enjoys brain health stimulating games/activities.       08/06/2022   11:26 AM 08/02/2019    8:54 AM  6CIT Screen  What Year?  0 points  What month?  0 points  What time?  0 points  Count back from 20  0 points  Months in reverse 0 points 0 points  Repeat phrase  0 points  Total Score  0 points    Immunizations Immunization History  Administered Date(s) Administered   Influenza Split 09/15/2012, 09/28/2014, 08/26/2016   Influenza, High Dose Seasonal PF 09/01/2017, 08/18/2018, 07/28/2019, 09/07/2020   Influenza-Unspecified 08/15/2013, 09/18/2014, 08/28/2015, 08/27/2016, 09/01/2017, 09/17/2021   PFIZER Comirnaty(Gray Top)Covid-19 Tri-Sucrose Vaccine 01/07/2020, 01/28/2020   PFIZER(Purple Top)SARS-COV-2 Vaccination 01/07/2020, 01/28/2020, 09/07/2020, 04/03/2021   Pfizer Covid-19 Vaccine Bivalent Booster 31yr & up 09/17/2021   Pneumococcal Conjugate-13 08/25/2014   Pneumococcal Polysaccharide-23 03/16/2013, 07/28/2019   Tdap 01/16/2011   Zoster, Live  01/16/2011   Shingrix  Completed?: No.    Education has been provided regarding the importance of this vaccine. Patient has been advised to call insurance company to determine out of pocket expense if they have not yet received this vaccine. Advised may also receive vaccine at local pharmacy or Health Dept. Verbalized acceptance and understanding.  Screening Tests Health Maintenance  Topic Date Due   COVID-19 Vaccine (8 - Pfizer risk series) 08/22/2022 (Originally 11/12/2021)   Zoster Vaccines- Shingrix (1 of 2) 11/06/2022 (Originally 08/28/1965)   INFLUENZA VACCINE  03/15/2023 (Originally 07/15/2022)   TETANUS/TDAP  08/07/2023 (Originally 01/16/2021)   MAMMOGRAM  08/26/2022   COLONOSCOPY (Pts 45-34yr Insurance coverage will need to be confirmed)  03/18/2032   Pneumonia Vaccine 75 Years old  Completed   DEXA SCAN  Completed   Hepatitis C Screening  Completed   HPV VACCINES  Aged Out   Health Maintenance There are no preventive care reminders to display for this patient.  Lung Cancer Screening: (Low Dose CT Chest recommended if Age 75-80years, 30 pack-year currently smoking OR have quit w/in 15years.) does not qualify.   Vision Screening: Recommended annual ophthalmology exams for early detection of glaucoma and other disorders of the eye.  Dental Screening: Recommended annual dental exams for proper oral hygiene  Community Resource Referral / Chronic Care Management: CRR required this visit?  No   CCM required this visit?  No      Plan:     I have personally reviewed and noted the following in the patient's chart:   Medical and social history Use of alcohol, tobacco or illicit drugs  Current medications and supplements including opioid prescriptions. Patient is not currently taking opioid prescriptions. Functional ability and status Nutritional status Physical activity Advanced directives List of other physicians Hospitalizations, surgeries, and ER visits in previous 12 months Vitals Screenings  to include cognitive, depression, and falls Referrals and appointments  In addition, I have reviewed and discussed with patient certain preventive protocols, quality metrics, and best practice recommendations. A written personalized care plan for preventive services as well as general preventive health recommendations were provided to patient.     OBrien-Blaney, Danee Soller L, LPN   [id number]     I have reviewed the above information and agree with above.   TDeborra Medina MD

## 2022-08-06 NOTE — Patient Instructions (Addendum)
  Shannon Hickman , Thank you for taking time to come for your Medicare Wellness Visit. I appreciate your ongoing commitment to your health goals. Please review the following plan we discussed and let me know if I can assist you in the future.   These are the goals we discussed:  Goals      Maintain Healthy Lifestyle     Stay active/increase physical activity Healthy diet        This is a list of the screening recommended for you and due dates:  Health Maintenance  Topic Date Due   COVID-19 Vaccine (8 - Pfizer risk series) 08/22/2022*   Zoster (Shingles) Vaccine (1 of 2) 11/06/2022*   Flu Shot  03/15/2023*   Tetanus Vaccine  08/07/2023*   Mammogram  08/26/2022   Colon Cancer Screening  03/18/2032   Pneumonia Vaccine  Completed   DEXA scan (bone density measurement)  Completed   Hepatitis C Screening: USPSTF Recommendation to screen - Ages 18-79 yo.  Completed   HPV Vaccine  Aged Out  *Topic was postponed. The date shown is not the original due date.

## 2022-08-15 ENCOUNTER — Other Ambulatory Visit: Payer: Self-pay | Admitting: Internal Medicine

## 2022-08-21 ENCOUNTER — Encounter: Payer: Self-pay | Admitting: Internal Medicine

## 2022-08-27 ENCOUNTER — Encounter: Payer: Self-pay | Admitting: Internal Medicine

## 2022-08-28 DIAGNOSIS — Z1231 Encounter for screening mammogram for malignant neoplasm of breast: Secondary | ICD-10-CM | POA: Diagnosis not present

## 2022-08-28 LAB — HM MAMMOGRAPHY

## 2022-09-18 DIAGNOSIS — Z23 Encounter for immunization: Secondary | ICD-10-CM | POA: Diagnosis not present

## 2022-09-24 ENCOUNTER — Encounter: Payer: Self-pay | Admitting: Internal Medicine

## 2022-09-24 ENCOUNTER — Ambulatory Visit (INDEPENDENT_AMBULATORY_CARE_PROVIDER_SITE_OTHER): Payer: Medicare Other | Admitting: Internal Medicine

## 2022-09-24 VITALS — BP 132/72 | HR 77 | Temp 97.5°F | Ht 64.0 in | Wt 150.0 lb

## 2022-09-24 DIAGNOSIS — R7301 Impaired fasting glucose: Secondary | ICD-10-CM | POA: Diagnosis not present

## 2022-09-24 DIAGNOSIS — E559 Vitamin D deficiency, unspecified: Secondary | ICD-10-CM

## 2022-09-24 DIAGNOSIS — E782 Mixed hyperlipidemia: Secondary | ICD-10-CM | POA: Diagnosis not present

## 2022-09-24 DIAGNOSIS — Z79899 Other long term (current) drug therapy: Secondary | ICD-10-CM

## 2022-09-24 DIAGNOSIS — M1711 Unilateral primary osteoarthritis, right knee: Secondary | ICD-10-CM | POA: Diagnosis not present

## 2022-09-24 DIAGNOSIS — I10 Essential (primary) hypertension: Secondary | ICD-10-CM

## 2022-09-24 LAB — CBC WITH DIFFERENTIAL/PLATELET
Basophils Absolute: 0 10*3/uL (ref 0.0–0.1)
Basophils Relative: 0.6 % (ref 0.0–3.0)
Eosinophils Absolute: 0.1 10*3/uL (ref 0.0–0.7)
Eosinophils Relative: 2 % (ref 0.0–5.0)
HCT: 40.6 % (ref 36.0–46.0)
Hemoglobin: 13.5 g/dL (ref 12.0–15.0)
Lymphocytes Relative: 23.7 % (ref 12.0–46.0)
Lymphs Abs: 1.2 10*3/uL (ref 0.7–4.0)
MCHC: 33.3 g/dL (ref 30.0–36.0)
MCV: 100.4 fl — ABNORMAL HIGH (ref 78.0–100.0)
Monocytes Absolute: 0.5 10*3/uL (ref 0.1–1.0)
Monocytes Relative: 10.2 % (ref 3.0–12.0)
Neutro Abs: 3.2 10*3/uL (ref 1.4–7.7)
Neutrophils Relative %: 63.5 % (ref 43.0–77.0)
Platelets: 193 10*3/uL (ref 150.0–400.0)
RBC: 4.04 Mil/uL (ref 3.87–5.11)
RDW: 13.1 % (ref 11.5–15.5)
WBC: 5.1 10*3/uL (ref 4.0–10.5)

## 2022-09-24 LAB — COMPREHENSIVE METABOLIC PANEL
ALT: 26 U/L (ref 0–35)
AST: 23 U/L (ref 0–37)
Albumin: 4.2 g/dL (ref 3.5–5.2)
Alkaline Phosphatase: 49 U/L (ref 39–117)
BUN: 22 mg/dL (ref 6–23)
CO2: 29 mEq/L (ref 19–32)
Calcium: 9.3 mg/dL (ref 8.4–10.5)
Chloride: 104 mEq/L (ref 96–112)
Creatinine, Ser: 0.63 mg/dL (ref 0.40–1.20)
GFR: 86.99 mL/min (ref >60.00)
Glucose, Bld: 92 mg/dL (ref 70–99)
Potassium: 4.4 mEq/L (ref 3.5–5.1)
Sodium: 139 mEq/L (ref 135–145)
Total Bilirubin: 0.5 mg/dL (ref 0.2–1.2)
Total Protein: 6.9 g/dL (ref 6.0–8.3)

## 2022-09-24 LAB — MICROALBUMIN / CREATININE URINE RATIO
Creatinine,U: 110.7 mg/dL
Microalb Creat Ratio: 0.8 mg/g (ref 0.0–30.0)
Microalb, Ur: 0.9 mg/dL (ref 0.0–1.9)

## 2022-09-24 LAB — LIPID PANEL
Cholesterol: 168 mg/dL (ref 0–200)
HDL: 69.4 mg/dL (ref >39.00)
LDL Cholesterol: 76 mg/dL (ref 0–99)
NonHDL: 98.57
Total CHOL/HDL Ratio: 2
Triglycerides: 112 mg/dL (ref 0.0–149.0)
VLDL: 22.4 mg/dL (ref 0.0–40.0)

## 2022-09-24 LAB — LDL CHOLESTEROL, DIRECT: Direct LDL: 80 mg/dL

## 2022-09-24 LAB — VITAMIN D 25 HYDROXY (VIT D DEFICIENCY, FRACTURES): VITD: 37 ng/mL (ref 30.00–100.00)

## 2022-09-24 LAB — HEMOGLOBIN A1C: Hgb A1c MFr Bld: 5.7 % (ref 4.6–6.5)

## 2022-09-24 LAB — TSH: TSH: 0.49 u[IU]/mL (ref 0.35–5.50)

## 2022-09-24 NOTE — Patient Instructions (Addendum)
Just keep moving !  For osteoarthritis  You can also try  Turmeric  available in capsules    Osteo biflex or generic  glucosamine chondroitin sulfate supplements

## 2022-09-24 NOTE — Assessment & Plan Note (Signed)
Well controlled on current regimen. Renal function stable, no changes today. 

## 2022-09-24 NOTE — Assessment & Plan Note (Addendum)
Reviewed options for treatment which include tylenol , glucosamine,  Turmeric and exercise, strengthening of quads and hamstrings

## 2022-09-24 NOTE — Progress Notes (Signed)
Subjective:  Patient ID: Shannon Hickman, female    DOB: 09-19-1946  Age: 76 y.o. MRN: [id number]  CC: The primary encounter diagnosis was Essential hypertension. Diagnoses of Mixed hyperlipidemia, Impaired fasting glucose, Encounter for long-term (current) use of high-risk medication, Vitamin D deficiency, and Primary osteoarthritis of right knee were also pertinent to this visit.   HPI Shannon Hickman presents for  Chief Complaint  Patient presents with   Follow-up    6 month follow up    1) HTN:  taking amlodipine 10 mg daily .  patient checks blood pressure twice weekly at home.  Readings have been for the most part  130/80 at rest . Patient is following a reduce salt diet most days and is taking medications as prescribed   2) HLD:  tolerating atorvastatin without myalgias   3) osteoporosis: she has been taking Evista   4) osteoarthritis;  has been having  bilateral knee pain worse after inactivity,  notes crepitus with extension and flexion   treated by Orthopedics.   Outpatient Medications Prior to Visit  Medication Sig Dispense Refill   acetaminophen (TYLENOL) 325 MG tablet Take 650 mg by mouth every 6 (six) hours as needed.     amLODipine (NORVASC) 10 MG tablet Take 1 tablet by mouth once daily 90 tablet 0   atorvastatin (LIPITOR) 40 MG tablet Take 1 tablet by mouth once daily 90 tablet 3   CALCIUM PO Take by mouth daily.     cetirizine (ZYRTEC) 10 MG tablet Take 10 mg by mouth daily.      Coenzyme Q10 (COQ10) 100 MG CAPS Take 1 capsule by mouth daily.     Multiple Vitamins-Minerals (CENTRUM SILVER ULTRA WOMENS PO) Take 1 tablet by mouth daily.     raloxifene (EVISTA) 60 MG tablet Take 1 tablet (60 mg total) by mouth daily. 90 tablet 3   vitamin B-12 (CYANOCOBALAMIN) 1000 MCG tablet Take 1,000 mcg by mouth daily.     No facility-administered medications prior to visit.    Review of Systems;  Patient denies headache, fevers, malaise, unintentional weight loss, skin rash,  eye pain, sinus congestion and sinus pain, sore throat, dysphagia,  hemoptysis , cough, dyspnea, wheezing, chest pain, palpitations, orthopnea, edema, abdominal pain, nausea, melena, diarrhea, constipation, flank pain, dysuria, hematuria, urinary  Frequency, nocturia, numbness, tingling, seizures,  Focal weakness, Loss of consciousness,  Tremor, insomnia, depression, anxiety, and suicidal ideation.      Objective:  BP 132/72 (BP Location: Left Arm, Patient Position: Sitting, Cuff Size: Normal)   Pulse 77   Temp (!) 97.5 F (36.4 C) (Oral)   Ht '5\' 4"'  (1.626 m)   Wt 150 lb (68 kg)   SpO2 98%   BMI 25.75 kg/m   BP Readings from Last 3 Encounters:  09/24/22 132/72  04/17/22 (!) 150/74  03/24/22 (!) 146/70    Wt Readings from Last 3 Encounters:  09/24/22 150 lb (68 kg)  08/06/22 148 lb (67.1 kg)  04/17/22 148 lb (67.1 kg)    General appearance: alert, cooperative and appears stated age Ears: normal TM's and external ear canals both ears Throat: lips, mucosa, and tongue normal; teeth and gums normal Neck: no adenopathy, no carotid bruit, supple, symmetrical, trachea midline and thyroid not enlarged, symmetric, no tenderness/mass/nodules Back: symmetric, no curvature. ROM normal. No CVA tenderness. Lungs: clear to auscultation bilaterally Heart: regular rate and rhythm, S1, S2 normal, no murmur, click, rub or gallop Abdomen: soft, non-tender; bowel sounds normal; no masses,  no organomegaly Pulses: 2+ and symmetric Skin: Skin color, texture, turgor normal. No rashes or lesions Lymph nodes: Cervical, supraclavicular, and axillary nodes normal. Neuro:  awake and interactive with normal mood and affect. Higher cortical functions are normal. Speech is clear without word-finding difficulty or dysarthria. Extraocular movements are intact. Visual fields of both eyes are grossly intact. Sensation to light touch is grossly intact bilaterally of upper and lower extremities. Motor examination  shows 4+/5 symmetric hand grip and upper extremity and 5/5 lower extremity strength. There is no pronation or drift. Gait is non-ataxic   Lab Results  Component Value Date   HGBA1C 5.7 09/24/2022   HGBA1C 5.3 09/16/2021    Lab Results  Component Value Date   CREATININE 0.63 09/24/2022   CREATININE 0.61 03/20/2022   CREATININE 0.74 09/16/2021    Lab Results  Component Value Date   WBC 5.1 09/24/2022   HGB 13.5 09/24/2022   HCT 40.6 09/24/2022   PLT 193.0 09/24/2022   GLUCOSE 92 09/24/2022   CHOL 168 09/24/2022   TRIG 112.0 09/24/2022   HDL 69.40 09/24/2022   LDLDIRECT 80.0 09/24/2022   LDLCALC 76 09/24/2022   ALT 26 09/24/2022   AST 23 09/24/2022   NA 139 09/24/2022   K 4.4 09/24/2022   CL 104 09/24/2022   CREATININE 0.63 09/24/2022   BUN 22 09/24/2022   CO2 29 09/24/2022   TSH 0.49 09/24/2022   HGBA1C 5.7 09/24/2022   MICROALBUR 0.9 09/24/2022    DG Bone Density  Result Date: 12/23/2021 EXAM: DUAL X-RAY ABSORPTIOMETRY (DXA) FOR BONE MINERAL DENSITY IMPRESSION: Your patient Shannon Hickman completed a BMD test on 12/23/2021 using the [employer] (software version: 14.10) manufactured by [employer]. The following summarizes the results of our evaluation. Technologist: MTB PATIENT BIOGRAPHICAL: Name: Shannon, Hickman I Patient ID: [id number] Birth Date: 1946/05/18 Height: 64.0 in. Gender: Female Exam Date: 12/23/2021 Weight: 147.0 lbs. Indications: Caucasian, Family Hx of Osteoporosis, Postmenopausal, Osteoarthritis Fractures: Treatments: Calcium, Evista, Multi-Vitamin DENSITOMETRY RESULTS: Site         Region     Measured Date Measured Age WHO Classification Young Adult T-score BMD         %Change vs. Previous Significant Change (*) AP Spine L1-L2 12/23/2021 74.3 Osteopenia -1.9 0.940 g/cm2 1.2% - AP Spine L1-L2 12/19/2019 72.3 Osteopenia -2.0 0.929 g/cm2 -2.5% - AP Spine L1-L2 10/22/2016 69.1 Osteopenia -1.8 0.953 g/cm2 - - DualFemur Neck Left 12/23/2021 74.3  Osteoporosis -2.5 0.694 g/cm2 -4.5% - DualFemur Neck Left 12/19/2019 72.3 Osteopenia -2.2 0.727 g/cm2 -1.9% - DualFemur Neck Left 10/22/2016 69.1 Osteopenia -2.1 0.741 g/cm2 - - DualFemur Total Mean 12/23/2021 74.3 Osteopenia -1.9 0.772 g/cm2 -1.2% - DualFemur Total Mean 12/19/2019 72.3 Osteopenia -1.8 0.781 g/cm2 -4.3% Yes DualFemur Total Mean 10/22/2016 69.1 Osteopenia -1.5 0.816 g/cm2 - - Left Forearm Radius 33% 12/23/2021 74.3 Osteopenia -1.6 0.737 g/cm2 -1.2% - Left Forearm Radius 33% 12/19/2019 72.3 Osteopenia -1.5 0.746 g/cm2 - - ASSESSMENT: The BMD measured at Femur Neck Left is 0.694 g/cm2 with a T-score of -2.5. This patient is considered osteoporotic according to [place] [facility]) criteria. The scan quality is good. L-3 and L-4 were excluded due to degenerative changes. Compared with prior study, there has been no significant change in the spine. Compared with prior study, there has been no significant change in the total hip. World Health Organization [facility]) criteria for post-menopausal, Caucasian Women: Normal:  T-score at or above -1 SD Osteopenia/low bone mass: T-score between -1 and -2.5 SD Osteoporosis:             T-score at or below -2.5 SD RECOMMENDATIONS: 1. All patients should optimize calcium and vitamin D intake. 2. Consider FDA-approved medical therapies in postmenopausal women and men aged 32 years and older, based on the following: a. A hip or vertebral(clinical or morphometric) fracture b. T-score < -2.5 at the femoral neck or spine after appropriate evaluation to exclude secondary causes c. Low bone mass (T-score between -1.0 and -2.5 at the femoral neck or spine) and a 10-year probability of a hip fracture > 3% or a 10-year probability of a major osteoporosis-related fracture > 20% based on the [place]-adapted WHO algorithm 3. Clinician judgment and/or patient preferences may indicate treatment for people with 10-year fracture probabilities above or below these  levels FOLLOW-UP: People with diagnosed cases of osteoporosis or at high risk for fracture should have regular bone mineral density tests. For patients eligible for Medicare, routine testing is allowed once every 2 years. The testing frequency can be increased to one year for patients who have rapidly progressing disease, those who are receiving or discontinuing medical therapy to restore bone mass, or have additional risk factors. I have reviewed this report, and agree with the above findings. [facility] Electronically Signed   By: Elmer Picker M.D.   On: 12/23/2021 10:17    Assessment & Plan:   Problem List Items Addressed This Visit     Essential hypertension - Primary    Well controlled on current regimen. Renal function stable, no changes today.      Relevant Orders   Comp Met (CMET) (Completed)   Urine Microalbumin w/creat. ratio (Completed)   Hyperlipidemia   Relevant Orders   Lipid Profile (Completed)   Direct LDL (Completed)   OA (osteoarthritis) of knee    Reviewed options for treatment which include tylenol , glucosamine,  Turmeric and exercise, strengthening of quads and hamstrings       Other Visit Diagnoses     Impaired fasting glucose       Relevant Orders   Comp Met (CMET) (Completed)   HgB A1c (Completed)   Encounter for long-term (current) use of high-risk medication       Relevant Orders   CBC with Differential/Platelet (Completed)   TSH (Completed)   Vitamin D deficiency       Relevant Orders   VITAMIN D 25 Hydroxy (Vit-D Deficiency, Fractures) (Completed)       I spent a total of   30  minutes with this patient in a face to face visit on the date of this encounter reviewing the last office visit with me in  March,  most recent visit with Orthopedics  ,  patient's diet and exercise habits, home blood pressure readings,  and post visit ordering of testing and therapeutics.    Follow-up: Return in about 6 months (around  03/26/2023).   Crecencio Mc, MD

## 2022-10-03 ENCOUNTER — Encounter: Payer: Self-pay | Admitting: Internal Medicine

## 2022-10-30 ENCOUNTER — Encounter: Payer: Self-pay | Admitting: Internal Medicine

## 2022-11-03 ENCOUNTER — Ambulatory Visit (INDEPENDENT_AMBULATORY_CARE_PROVIDER_SITE_OTHER): Payer: Medicare Other | Admitting: Dermatology

## 2022-11-03 DIAGNOSIS — D2372 Other benign neoplasm of skin of left lower limb, including hip: Secondary | ICD-10-CM

## 2022-11-03 DIAGNOSIS — L578 Other skin changes due to chronic exposure to nonionizing radiation: Secondary | ICD-10-CM

## 2022-11-03 DIAGNOSIS — D224 Melanocytic nevi of scalp and neck: Secondary | ICD-10-CM

## 2022-11-03 DIAGNOSIS — D229 Melanocytic nevi, unspecified: Secondary | ICD-10-CM

## 2022-11-03 DIAGNOSIS — I8393 Asymptomatic varicose veins of bilateral lower extremities: Secondary | ICD-10-CM

## 2022-11-03 DIAGNOSIS — L814 Other melanin hyperpigmentation: Secondary | ICD-10-CM | POA: Diagnosis not present

## 2022-11-03 DIAGNOSIS — L821 Other seborrheic keratosis: Secondary | ICD-10-CM

## 2022-11-03 DIAGNOSIS — L719 Rosacea, unspecified: Secondary | ICD-10-CM | POA: Diagnosis not present

## 2022-11-03 DIAGNOSIS — Z1283 Encounter for screening for malignant neoplasm of skin: Secondary | ICD-10-CM

## 2022-11-03 DIAGNOSIS — L820 Inflamed seborrheic keratosis: Secondary | ICD-10-CM | POA: Diagnosis not present

## 2022-11-03 NOTE — Patient Instructions (Addendum)

## 2022-11-03 NOTE — Progress Notes (Signed)
New Patient Visit  Subjective  Shannon Hickman is a 76 y.o. female who presents for the following: Total body skin exam and Acne (No hx of skin ca, no fhx of skin ca).  The patient presents for Total-Body Skin Exam (TBSE) for skin cancer screening and mole check.  The patient has spots, moles and lesions to be evaluated, some may be new or changing and the patient has concerns that these could be cancer.   The following portions of the chart were reviewed this encounter and updated as appropriate:       Review of Systems:  No other skin or systemic complaints except as noted in HPI or Assessment and Plan.  Objective  Well appearing patient in no apparent distress; mood and affect are within normal limits.  A full examination was performed including scalp, head, eyes, ears, nose, lips, neck, chest, axillae, abdomen, back, buttocks, bilateral upper extremities, bilateral lower extremities, hands, feet, fingers, toes, fingernails, and toenails. All findings within normal limits unless otherwise noted below.  face Erythema malar cheeks and nose  R upper back x 1 Stuck on waxy paps with erythema  R ant neck  R ant neck 2.6m med brown macule 2 tone with notch  L eyebrow x 1, R temple x 2 (3) Stuck-on, waxy, tan-brown papule or plaque --Discussed benign etiology and prognosis.          Assessment & Plan   Lentigines - Scattered tan macules - Due to sun exposure - Benign-appearing, observe - Recommend daily broad spectrum sunscreen SPF 30+ to sun-exposed areas, reapply every 2 hours as needed. - Call for any changes - shoulders  Seborrheic Keratoses - Stuck-on, waxy, tan-brown papules and/or plaques  - Benign-appearing - Discussed benign etiology and prognosis. - Observe - Call for any changes - back, legs, face  Melanocytic Nevi - Tan-brown and/or pink-flesh-colored symmetric macules and papules - Benign appearing on exam today - Observation - Call clinic for  new or changing moles - Recommend daily use of broad spectrum spf 30+ sunscreen to sun-exposed areas.  - abdomen  Hemangiomas - Red papules - Discussed benign nature - Observe - Call for any changes - trunk  Actinic Damage - Chronic condition, secondary to cumulative UV/sun exposure - diffuse scaly erythematous macules with underlying dyspigmentation - Recommend daily broad spectrum sunscreen SPF 30+ to sun-exposed areas, reapply every 2 hours as needed.  - Staying in the shade or wearing long sleeves, sun glasses (UVA+UVB protection) and wide brim hats (4-inch brim around the entire circumference of the hat) are also recommended for sun protection.  - Call for new or changing lesions.  Skin cancer screening performed today.   Varicose Veins/Spider Veins - Dilated blue, purple or red veins at the lower extremities - Reassured - Smaller vessels can be treated by sclerotherapy (a procedure to inject a medicine into the veins to make them disappear) if desired, but the treatment is not covered by insurance. Larger vessels may be covered if symptomatic and we would refer to vascular surgeon if treatment desired.  Dermatofibroma - Firm pink/brown papulenodule with dimple sign - Benign appearing - Call for any changes  - L lat thigh  Rosacea face  Chronic condition with duration or expected duration over one year. Currently well-controlled.   Rosacea is a chronic progressive skin condition usually affecting the face of adults, causing redness and/or acne bumps. It is treatable but not curable. It sometimes affects the eyes (ocular rosacea) as well. It  may respond to topical and/or systemic medication and can flare with stress, sun exposure, alcohol, exercise, topical steroids (including hydrocortisone/cortisone 10) and some foods.  Daily application of broad spectrum spf 30+ sunscreen to face is recommended to reduce flares.  Cont Metronidazole 0.75% gel qhs as prescribed by Dr.  Derrel Nip  Inflamed seborrheic keratosis R upper back x 1  Symptomatic, irritating, patient would like treated.   Destruction of lesion - R upper back x 1  Destruction method: cryotherapy   Informed consent: discussed and consent obtained   Lesion destroyed using liquid nitrogen: Yes   Region frozen until ice ball extended beyond lesion: Yes   Outcome: patient tolerated procedure well with no complications   Post-procedure details: wound care instructions given   Additional details:  Prior to procedure, discussed risks of blister formation, small wound, skin dyspigmentation, or rare scar following cryotherapy. Recommend Vaseline ointment to treated areas while healing.   Nevus R ant neck  Benign-appearing.  Observation.  Call clinic for new or changing moles.  Recommend daily use of broad spectrum spf 30+ sunscreen to sun-exposed areas.    Seborrheic keratosis (3) L eyebrow x 1, R temple x 2  Reassured benign age-related growth.  Recommend observation.    Discussed cosmetic procedure (cryotherapy), noncovered.  $60 for 1st lesion and $15 for each additional lesion if done on the same day.  Maximum charge $350.  One touch-up treatment included no charge. Discussed risks of treatment including dyspigmentation, small scar, and/or recurrence. Recommend daily broad spectrum sunscreen SPF 30+/photoprotection to treated areas once healed.   LN2 x 3 today, $90 cosmetic charge  Destruction of lesion - L eyebrow x 1, R temple x 2  Destruction method: cryotherapy   Informed consent: discussed and consent obtained   Lesion destroyed using liquid nitrogen: Yes   Region frozen until ice ball extended beyond lesion: Yes   Outcome: patient tolerated procedure well with no complications   Post-procedure details: wound care instructions given   Additional details:  Prior to procedure, discussed risks of blister formation, small wound, skin dyspigmentation, or rare scar following cryotherapy.  Recommend Vaseline ointment to treated areas while healing.    Return in about 2 months (around 01/03/2023) for SK f/u.  I, Othelia Pulling, RMA, am acting as scribe for Brendolyn Patty, MD .  Documentation: I have reviewed the above documentation for accuracy and completeness, and I agree with the above.  Brendolyn Patty MD

## 2022-11-10 ENCOUNTER — Other Ambulatory Visit: Payer: Self-pay | Admitting: Family

## 2022-11-17 ENCOUNTER — Encounter: Payer: Self-pay | Admitting: Internal Medicine

## 2022-11-17 MED ORDER — AMLODIPINE BESYLATE 10 MG PO TABS: 10.0000 mg | ORAL_TABLET | Freq: Every day | ORAL | 1 refills | Status: DC

## 2022-12-02 ENCOUNTER — Other Ambulatory Visit: Payer: Self-pay | Admitting: Internal Medicine

## 2023-01-05 ENCOUNTER — Ambulatory Visit (INDEPENDENT_AMBULATORY_CARE_PROVIDER_SITE_OTHER): Payer: Medicare Other | Admitting: Dermatology

## 2023-01-05 VITALS — BP 143/64

## 2023-01-05 DIAGNOSIS — L821 Other seborrheic keratosis: Secondary | ICD-10-CM

## 2023-01-05 NOTE — Patient Instructions (Addendum)
Cryotherapy Aftercare  Wash gently with soap and water everyday.   Apply Vaseline and Band-Aid daily until healed.     Due to recent changes in healthcare laws, you may see results of your pathology and/or laboratory studies on MyChart before the doctors have had a chance to review them. We understand that in some cases there may be results that are confusing or concerning to you. Please understand that not all results are received at the same time and often the doctors may need to interpret multiple results in order to provide you with the best plan of care or course of treatment. Therefore, we ask that you please give us 2 business days to thoroughly review all your results before contacting the office for clarification. Should we see a critical lab result, you will be contacted sooner.   If You Need Anything After Your Visit  If you have any questions or concerns for your doctor, please call our main line at [phone] and press option 4 to reach your doctor's medical assistant. If no one answers, please leave a voicemail as directed and we will return your call as soon as possible. Messages left after 4 pm will be answered the following business day.   You may also send us a message via MyChart. We typically respond to MyChart messages within 1-2 business days.  For prescription refills, please ask your pharmacy to contact our office. Our fax number is [phone].  If you have an urgent issue when the clinic is closed that cannot wait until the next business day, you can page your doctor at the number below.    Please note that while we do our best to be available for urgent issues outside of office hours, we are not available 24/7.   If you have an urgent issue and are unable to reach us, you may choose to seek medical care at your doctor's office, retail clinic, urgent care center, or emergency room.  If you have a medical emergency, please immediately call 911 or go to the  emergency department.  Pager Numbers  - Dr. Kowalski: [phone]  - Dr. Moye: [phone]  - Dr. Stewart: [phone]  In the event of inclement weather, please call our main line at [phone] for an update on the status of any delays or closures.  Dermatology Medication Tips: Please keep the boxes that topical medications come in in order to help keep track of the instructions about where and how to use these. Pharmacies typically print the medication instructions only on the boxes and not directly on the medication tubes.   If your medication is too expensive, please contact our office at [phone] option 4 or send us a message through MyChart.   We are unable to tell what your co-pay for medications will be in advance as this is different depending on your insurance coverage. However, we may be able to find a substitute medication at lower cost or fill out paperwork to get insurance to cover a needed medication.   If a prior authorization is required to get your medication covered by your insurance company, please allow us 1-2 business days to complete this process.  Drug prices often vary depending on where the prescription is filled and some pharmacies may offer cheaper prices.  The website www.goodrx.com contains coupons for medications through different pharmacies. The prices here do not account for what the cost may be with help from insurance (it may be cheaper with your insurance), but the website can   give you the price if you did not use any insurance.  - You can print the associated coupon and take it with your prescription to the pharmacy.  - You may also stop by our office during regular business hours and pick up a GoodRx coupon card.  - If you need your prescription sent electronically to a different pharmacy, notify our office through [facility] MyChart or by phone at [phone] option 4.     Si Usted Necesita Algo Despus de Su Visita  Tambin puede  enviarnos un mensaje a travs de MyChart. Por lo general respondemos a los mensajes de MyChart en el transcurso de 1 a 2 das hbiles.  Para renovar recetas, por favor pida a su farmacia que se ponga en contacto con nuestra oficina. Nuestro nmero de fax es el [phone].  Si tiene un asunto urgente cuando la clnica est cerrada y que no puede esperar hasta el siguiente da hbil, puede llamar/localizar a su doctor(a) al nmero que aparece a continuacin.   Por favor, tenga en cuenta que aunque hacemos todo lo posible para estar disponibles para asuntos urgentes fuera del horario de oficina, no estamos disponibles las 24 horas del da, los 7 das de la semana.   Si tiene un problema urgente y no puede comunicarse con nosotros, puede optar por buscar atencin mdica  en el consultorio de su doctor(a), en una clnica privada, en un centro de atencin urgente o en una sala de emergencias.  Si tiene una emergencia mdica, por favor llame inmediatamente al 911 o vaya a la sala de emergencias.  Nmeros de bper  - Dr. Kowalski: [phone]  - Dra. Moye: [phone]  - Dra. Stewart: [phone]  En caso de inclemencias del tiempo, por favor llame a nuestra lnea principal al [phone] para una actualizacin sobre el estado de cualquier retraso o cierre.  Consejos para la medicacin en dermatologa: Por favor, guarde las cajas en las que vienen los medicamentos de uso tpico para ayudarle a seguir las instrucciones sobre dnde y cmo usarlos. Las farmacias generalmente imprimen las instrucciones del medicamento slo en las cajas y no directamente en los tubos del medicamento.   Si su medicamento es muy caro, por favor, pngase en contacto con nuestra oficina llamando al [phone] y presione la opcin 4 o envenos un mensaje a travs de MyChart.   No podemos decirle cul ser su copago por los medicamentos por adelantado ya que esto es diferente dependiendo de la cobertura de su seguro.  Sin embargo, es posible que podamos encontrar un medicamento sustituto a menor costo o llenar un formulario para que el seguro cubra el medicamento que se considera necesario.   Si se requiere una autorizacin previa para que su compaa de seguros cubra su medicamento, por favor permtanos de 1 a 2 das hbiles para completar este proceso.  Los precios de los medicamentos varan con frecuencia dependiendo del lugar de dnde se surte la receta y alguna farmacias pueden ofrecer precios ms baratos.  El sitio web www.goodrx.com tiene cupones para medicamentos de diferentes farmacias. Los precios aqu no tienen en cuenta lo que podra costar con la ayuda del seguro (puede ser ms barato con su seguro), pero el sitio web puede darle el precio si no utiliz ningn seguro.  - Puede imprimir el cupn correspondiente y llevarlo con su receta a la farmacia.  - Tambin puede pasar por nuestra oficina durante el horario de atencin regular y recoger una tarjeta de cupones de GoodRx.  -   Si necesita que su receta se enve electrnicamente a una farmacia diferente, informe a nuestra oficina a travs de MyChart de [place] o por telfono llamando al [phone] y presione la opcin 4.  

## 2023-01-05 NOTE — Progress Notes (Signed)
   Follow-Up Visit   Subjective  Shannon Hickman is a 77 y.o. female who presents for the following: Follow-up.  Patient presents for 2 month follow-up cosmetic treatment to SKs of the left eyebrow and right temple.  The following portions of the chart were reviewed this encounter and updated as appropriate:       Review of Systems:  No other skin or systemic complaints except as noted in HPI or Assessment and Plan.  Objective  Well appearing patient in no apparent distress; mood and affect are within normal limits.  A focused examination was performed including face. Relevant physical exam findings are noted in the Assessment and Plan.  Right Temple x 2; Left Eyebrow x 1 Residual waxy brown papules.    Assessment & Plan  Seborrheic keratosis Right Temple x 2; Left Eyebrow x 1  Residual cosmetic Sks - Touch-up cryotherapy treatment today, no charge.   Destruction of lesion - Right Temple x 2; Left Eyebrow x 1  Destruction method: cryotherapy   Informed consent: discussed and consent obtained   Lesion destroyed using liquid nitrogen: Yes   Region frozen until ice ball extended beyond lesion: Yes   Outcome: patient tolerated procedure well with no complications   Post-procedure details: wound care instructions given   Additional details:  Prior to procedure, discussed risks of blister formation, small wound, skin dyspigmentation, or rare scar following cryotherapy. Recommend Vaseline ointment to treated areas while healing.    Return in about 10 months (around 11/06/2023) for TBSE.  IJamesetta Orleans, CMA, am acting as scribe for Brendolyn Patty, MD .   Documentation: I have reviewed the above documentation for accuracy and completeness, and I agree with the above.  Brendolyn Patty MD

## 2023-01-08 ENCOUNTER — Other Ambulatory Visit: Payer: Self-pay | Admitting: Internal Medicine

## 2023-03-26 ENCOUNTER — Encounter: Payer: Self-pay | Admitting: Internal Medicine

## 2023-03-26 ENCOUNTER — Ambulatory Visit (INDEPENDENT_AMBULATORY_CARE_PROVIDER_SITE_OTHER): Payer: Medicare Other | Admitting: Internal Medicine

## 2023-03-26 VITALS — BP 140/70 | HR 81 | Temp 98.0°F | Ht 64.0 in | Wt 153.0 lb

## 2023-03-26 DIAGNOSIS — Z9882 Breast implant status: Secondary | ICD-10-CM

## 2023-03-26 DIAGNOSIS — I10 Essential (primary) hypertension: Secondary | ICD-10-CM

## 2023-03-26 DIAGNOSIS — E559 Vitamin D deficiency, unspecified: Secondary | ICD-10-CM | POA: Diagnosis not present

## 2023-03-26 DIAGNOSIS — E782 Mixed hyperlipidemia: Secondary | ICD-10-CM | POA: Diagnosis not present

## 2023-03-26 DIAGNOSIS — M810 Age-related osteoporosis without current pathological fracture: Secondary | ICD-10-CM

## 2023-03-26 DIAGNOSIS — Z1231 Encounter for screening mammogram for malignant neoplasm of breast: Secondary | ICD-10-CM | POA: Diagnosis not present

## 2023-03-26 LAB — COMPREHENSIVE METABOLIC PANEL
ALT: 27 U/L (ref 0–35)
AST: 26 U/L (ref 0–37)
Albumin: 4.7 g/dL (ref 3.5–5.2)
Alkaline Phosphatase: 49 U/L (ref 39–117)
BUN: 19 mg/dL (ref 6–23)
CO2: 29 mEq/L (ref 19–32)
Calcium: 9.6 mg/dL (ref 8.4–10.5)
Chloride: 104 mEq/L (ref 96–112)
Creatinine, Ser: 0.69 mg/dL (ref 0.40–1.20)
GFR: 84.8 mL/min (ref >60.00)
Glucose, Bld: 94 mg/dL (ref 70–99)
Potassium: 4.1 mEq/L (ref 3.5–5.1)
Sodium: 142 mEq/L (ref 135–145)
Total Bilirubin: 0.5 mg/dL (ref 0.2–1.2)
Total Protein: 7.2 g/dL (ref 6.0–8.3)

## 2023-03-26 LAB — LIPID PANEL
Cholesterol: 186 mg/dL (ref 0–200)
HDL: 85 mg/dL (ref >39.00)
LDL Cholesterol: 84 mg/dL (ref 0–99)
NonHDL: 101.35
Total CHOL/HDL Ratio: 2
Triglycerides: 85 mg/dL (ref 0.0–149.0)
VLDL: 17 mg/dL (ref 0.0–40.0)

## 2023-03-26 LAB — LDL CHOLESTEROL, DIRECT: Direct LDL: 79 mg/dL

## 2023-03-26 LAB — VITAMIN D 25 HYDROXY (VIT D DEFICIENCY, FRACTURES): VITD: 43.38 ng/mL (ref 30.00–100.00)

## 2023-03-26 MED ORDER — AMLODIPINE BESYLATE 10 MG PO TABS: 10.0000 mg | ORAL_TABLET | Freq: Every day | ORAL | 1 refills | Status: DC

## 2023-03-26 MED ORDER — ATORVASTATIN CALCIUM 40 MG PO TABS: 40.0000 mg | ORAL_TABLET | Freq: Every day | ORAL | 3 refills | Status: DC

## 2023-03-26 MED ORDER — RALOXIFENE HCL 60 MG PO TABS: 60.0000 mg | ORAL_TABLET | Freq: Every day | ORAL | 1 refills | Status: DC

## 2023-03-26 NOTE — Assessment & Plan Note (Addendum)
Treated with Evista since late July  2021.  Will repeat DEXA in 2024  since the BMD in the hip decreased from 2021 to 2023

## 2023-03-26 NOTE — Progress Notes (Signed)
6 

## 2023-03-26 NOTE — Patient Instructions (Addendum)
[facility] online  For your knee pain:  Try using tylenol 1000 mg twice daily and diclofenac  (Voltaren)  gel up to 4 times  daily on the knees (OTC)  [facility]   Get your TdaP this summer .  You are overdue  Your annual mammogram AND  DEXA  SCAN have been ordered.  Please call [facility] to call to make your appointments  .  The phone number for [facility] is  [phone]

## 2023-03-26 NOTE — Progress Notes (Signed)
Patient ID: Shannon Hickman Hickman, female    DOB: 10-03-1946  Age: 77 y.o. MRN: [id number]  The patient is here for annual follow up  and management of other chronic and acute problems.   The risk factors are reflected in the social history.  The roster of all physicians providing medical care to patient - is listed in the Snapshot section of the chart.  Activities of daily living:  The patient is 100% independent in all ADLs: dressing, toileting, feeding as well as independent mobility  Home safety : The patient has smoke detectors in the home. They wear seatbelts.  There are no firearms at home. There is no violence in the home.   There is no risks for hepatitis, STDs or HIV. There is no   history of blood transfusion. They have no travel history to infectious disease endemic areas of the world.  The patient has seen their dentist in the last six month. They have seen their eye doctor in the last year. They admit to slight hearing difficulty with regard to whispered voices and some television programs.  They have deferred audiologic testing in the last year.  They do not  have excessive sun exposure. Discussed the need for sun protection: hats, long sleeves and use of sunscreen if there is significant sun exposure.   Diet: the importance of a healthy diet is discussed. They do have a healthy diet.  The benefits of regular aerobic exercise were discussed. She is limited from walking or exercising due to bilateral knee pain    Depression screen: there are no signs or vegative symptoms of depression- irritability, change in appetite, anhedonia, sadness/tearfullness.  Cognitive assessment: the patient manages all their financial and personal affairs and is actively engaged. They could relate day,date,year and events; recalled 2/3 objects at 3 minutes; performed clock-face test normally.  The following portions of the patient's history were reviewed and updated as appropriate: allergies, current  medications, past family history, past medical history,  past surgical history, past social history  and problem list.  Visual acuity was not assessed per patient preference since she has regular follow up with her ophthalmologist. Hearing and body mass index were assessed and reviewed.   During the course of the visit the patient was educated and counseled about appropriate screening and preventive services including : fall prevention , diabetes screening, nutrition counseling, colorectal cancer screening, and recommended immunizations.    CC: The primary encounter diagnosis was Encounter for screening mammogram for malignant neoplasm of breast. Diagnoses of Age-related osteoporosis without current pathological fracture, Vitamin D deficiency, Mixed hyperlipidemia, History of elective breast augmentation, and Essential hypertension were also pertinent to this visit.  History Shannon Hickman has a past medical history of Chicken pox, Diverticulitis, Heart murmur, High cholesterol, Hypertension, Inflammatory polyps of colon, Migraine, and Urinary tract infection.   She has a past surgical history that includes HISTOPLASMOSIS; Appendectomy (1998); Partial colectomy; Knee arthroscopy (Left, 08/02/2015); Bunionectomy (Left, 01/08/2017); and Bunionectomy (Right, 06/26/2021).   Her family history includes Diabetes (age of onset: 4470) in her father; Heart disease (age of onset: 5960) in her father; Stomach cancer in her maternal grandfather.She reports that she has never smoked. She has never used smokeless tobacco. She reports that she does not drink alcohol. No history on file for drug use.  Outpatient Medications Prior to Visit  Medication Sig Dispense Refill   acetaminophen (TYLENOL) 325 MG tablet Take 650 mg by mouth every 6 (six) hours as needed.     CALCIUM  PO Take by mouth daily.     cetirizine (ZYRTEC) 10 MG tablet Take 10 mg by mouth daily.      Coenzyme Q10 (COQ10) 100 MG CAPS Take 1 capsule by mouth daily.      metroNIDAZOLE (METROGEL) 0.75 % gel Apply 1 Application topically at bedtime.     Misc Natural Products (OSTEO BI-FLEX ADV TRIPLE ST PO)      Multiple Vitamins-Minerals (CENTRUM SILVER ULTRA WOMENS PO) Take 1 tablet by mouth daily.     vitamin B-12 (CYANOCOBALAMIN) 1000 MCG tablet Take 1,000 mcg by mouth daily.     amLODipine (NORVASC) 10 MG tablet Take 1 tablet (10 mg total) by mouth daily. 90 tablet 1   atorvastatin (LIPITOR) 40 MG tablet Take 1 tablet by mouth once daily 90 tablet 0   raloxifene (EVISTA) 60 MG tablet Take 1 tablet by mouth once daily 90 tablet 1   No facility-administered medications prior to visit.    Review of Systems  Patient denies headache, fevers, malaise, unintentional weight loss, skin rash, eye pain, sinus congestion and sinus pain, sore throat, dysphagia,  hemoptysis , cough, dyspnea, wheezing, chest pain, palpitations, orthopnea, edema, abdominal pain, nausea, melena, diarrhea, constipation, flank pain, dysuria, hematuria, urinary  Frequency, nocturia, numbness, tingling, seizures,  Focal weakness, Loss of consciousness,  Tremor, insomnia, depression, anxiety, and suicidal ideation.     Objective:  BP (!) 140/70   Pulse 81   Temp 98 F (36.7 C) (Oral)   Ht 5\' 4"  (1.626 m)   Wt 153 lb (69.4 kg)   SpO2 97%   BMI 26.26 kg/m   Physical Exam Vitals reviewed.  Constitutional:      General: She is not in acute distress.    Appearance: Normal appearance. She is well-developed and normal weight. She is not ill-appearing, toxic-appearing or diaphoretic.  HENT:     Head: Normocephalic.     Right Ear: Tympanic membrane, ear canal and external ear normal. There is no impacted cerumen.     Left Ear: Tympanic membrane, ear canal and external ear normal. There is no impacted cerumen.     Nose: Nose normal.     Mouth/Throat:     Mouth: Mucous membranes are moist.     Pharynx: Oropharynx is clear.  Eyes:     General: No scleral icterus.       Right eye: No  discharge.        Left eye: No discharge.     Conjunctiva/sclera: Conjunctivae normal.     Pupils: Pupils are equal, round, and reactive to light.  Neck:     Thyroid: No thyromegaly.     Vascular: No carotid bruit or JVD.  Cardiovascular:     Rate and Rhythm: Normal rate and regular rhythm.     Heart sounds: Normal heart sounds.  Pulmonary:     Effort: Pulmonary effort is normal. No respiratory distress.     Breath sounds: Normal breath sounds.  Chest:  Breasts:    Breasts are symmetrical.     Right: Normal. No swelling, inverted nipple, mass, nipple discharge, skin change or tenderness.     Left: Normal. No swelling, inverted nipple, mass, nipple discharge, skin change or tenderness.  Abdominal:     General: Bowel sounds are normal.     Palpations: Abdomen is soft. There is no mass.     Tenderness: There is no abdominal tenderness. There is no guarding or rebound.  Musculoskeletal:        General:  Normal range of motion.     Cervical back: Normal range of motion and neck supple.  Lymphadenopathy:     Cervical: No cervical adenopathy.     Upper Body:     Right upper body: No supraclavicular, axillary or pectoral adenopathy.     Left upper body: No supraclavicular, axillary or pectoral adenopathy.  Skin:    General: Skin is warm and dry.  Neurological:     General: No focal deficit present.     Mental Status: She is alert and oriented to person, place, and time. Mental status is at baseline.  Psychiatric:        Mood and Affect: Mood normal.        Behavior: Behavior normal.        Thought Content: Thought content normal.        Judgment: Judgment normal.     Assessment & Plan:  Encounter for screening mammogram for malignant neoplasm of breast -     3D Screening Mammogram, Left and Right; Future  Age-related osteoporosis without current pathological fracture Assessment & Plan: Treated with Evista since late July  2021.  Will repeat DEXA in 2024  since the BMD in the  hip decreased from 2021 to 2023  Orders: -     DG Bone Density; Future  Vitamin D deficiency -     VITAMIN D 25 Hydroxy (Vit-D Deficiency, Fractures)  Mixed hyperlipidemia Assessment & Plan: Controlled on  80 mg atorvastatin daily.  LFTs normal.   Lab Results  Component Value Date   CHOL 186 03/26/2023   HDL 85.00 03/26/2023   LDLCALC 84 03/26/2023   LDLDIRECT 79.0 03/26/2023   TRIG 85.0 03/26/2023   CHOLHDL 2 03/26/2023   Lab Results  Component Value Date   ALT 27 03/26/2023   AST 26 03/26/2023   ALKPHOS 49 03/26/2023   BILITOT 0.5 03/26/2023     Orders: -     Lipid panel -     LDL cholesterol, direct -     Comprehensive metabolic panel -     Comprehensive metabolic panel; Future -     LDL cholesterol, direct; Future -     Lipid panel; Future  History of elective breast augmentation  Essential hypertension Assessment & Plan: Well controlled on amlodipine 10 mg daily . Renal function stable, no changes today.    Other orders -     amLODIPine Besylate; Take 1 tablet (10 mg total) by mouth daily.  Dispense: 90 tablet; Refill: 1 -     Atorvastatin Calcium; Take 1 tablet (40 mg total) by mouth daily.  Dispense: 90 tablet; Refill: 3 -     Raloxifene HCl; Take 1 tablet (60 mg total) by mouth daily.  Dispense: 90 tablet; Refill: 1    Hickman provided 30 minutes of  face-to-face time during this encounter reviewing patient's current problems and past surgeries,  recent labs and imaging studies, providing counseling on the above mentioned problems , and coordination  of care .   Follow-up: Return in about 6 months (around 09/25/2023).   Sherlene Shams, MD

## 2023-03-29 ENCOUNTER — Encounter: Payer: Self-pay | Admitting: Internal Medicine

## 2023-03-29 NOTE — Assessment & Plan Note (Signed)
Controlled on  80 mg atorvastatin daily.  LFTs normal.   Lab Results  Component Value Date   CHOL 186 03/26/2023   HDL 85.00 03/26/2023   LDLCALC 84 03/26/2023   LDLDIRECT 79.0 03/26/2023   TRIG 85.0 03/26/2023   CHOLHDL 2 03/26/2023   Lab Results  Component Value Date   ALT 27 03/26/2023   AST 26 03/26/2023   ALKPHOS 49 03/26/2023   BILITOT 0.5 03/26/2023

## 2023-03-29 NOTE — Assessment & Plan Note (Signed)
Well controlled on amlodipine 10 mg daily  . Renal function stable, no changes today. 

## 2023-04-13 ENCOUNTER — Encounter: Payer: Self-pay | Admitting: Internal Medicine

## 2023-04-13 DIAGNOSIS — Z780 Asymptomatic menopausal state: Secondary | ICD-10-CM

## 2023-06-09 DIAGNOSIS — I10 Essential (primary) hypertension: Secondary | ICD-10-CM | POA: Diagnosis not present

## 2023-06-09 DIAGNOSIS — E7800 Pure hypercholesterolemia, unspecified: Secondary | ICD-10-CM | POA: Diagnosis not present

## 2023-06-09 DIAGNOSIS — I351 Nonrheumatic aortic (valve) insufficiency: Secondary | ICD-10-CM | POA: Diagnosis not present

## 2023-06-10 ENCOUNTER — Ambulatory Visit
Admission: RE | Admit: 2023-06-10 | Discharge: 2023-06-10 | Disposition: A | Discharge status: Home / Self Care | Payer: Medicare Other | Source: Ambulatory Visit | Attending: Internal Medicine | Admitting: Internal Medicine

## 2023-06-10 DIAGNOSIS — Z780 Asymptomatic menopausal state: Secondary | ICD-10-CM | POA: Insufficient documentation

## 2023-06-10 DIAGNOSIS — M810 Age-related osteoporosis without current pathological fracture: Secondary | ICD-10-CM | POA: Diagnosis not present

## 2023-06-13 NOTE — Assessment & Plan Note (Signed)
T scores have declined despite Evista since 2021

## 2023-06-23 DIAGNOSIS — I351 Nonrheumatic aortic (valve) insufficiency: Secondary | ICD-10-CM | POA: Diagnosis not present

## 2023-08-07 ENCOUNTER — Encounter: Payer: Self-pay | Admitting: Internal Medicine

## 2023-08-11 ENCOUNTER — Ambulatory Visit (INDEPENDENT_AMBULATORY_CARE_PROVIDER_SITE_OTHER): Payer: Medicare Other | Admitting: *Deleted

## 2023-08-11 VITALS — Ht 64.0 in | Wt 148.0 lb

## 2023-08-11 DIAGNOSIS — Z0000 Encounter for general adult medical examination without abnormal findings: Secondary | ICD-10-CM | POA: Diagnosis not present

## 2023-08-11 NOTE — Patient Instructions (Signed)
Shannon Hickman , Thank you for taking time to come for your Medicare Wellness Visit. I appreciate your ongoing commitment to your health goals. Please review the following plan we discussed and let me know if I can assist you in the future.   Referrals/Orders/Follow-Ups/Clinician Recommendations: None  This is a list of the screening recommended for you and due dates:  Health Maintenance  Topic Date Due   COVID-19 Vaccine (9 - 2023-24 season) 11/13/2022   Flu Shot  07/16/2023   Mammogram  08/29/2023   Medicare Annual Wellness Visit  08/10/2024   Colon Cancer Screening  03/18/2032   DTaP/Tdap/Td vaccine (3 - Td or Tdap) 03/25/2033   Pneumonia Vaccine  Completed   DEXA scan (bone density measurement)  Completed   Hepatitis C Screening  Completed   Zoster (Shingles) Vaccine  Completed   HPV Vaccine  Aged Out    Advanced directives: (Copy Requested) Please bring a copy of your health care power of attorney and living will to the office to be added to your chart at your convenience.  Next Medicare Annual Wellness Visit scheduled for next year: Yes 08/16/24 @ 9:00

## 2023-08-11 NOTE — Progress Notes (Signed)
Subjective:   Shannon Hickman is a 77 y.o. female who presents for Medicare Annual (Subsequent) preventive examination.  Visit Complete: Virtual  I connected with  Andreas Newport on 08/11/23 by a audio enabled telemedicine application and verified that I am speaking with the correct person using two identifiers.  Patient Location: Home  Provider Location: Office/Clinic  I discussed the limitations of evaluation and management by telemedicine. The patient expressed understanding and agreed to proceed.  Patient Medicare AWV questionnaire was completed by the patient on 08/08/23; I have confirmed that all information answered by patient is correct and no changes since this date.  Vital Signs: Unable to obtain new vitals due to this being a telehealth visit.    Review of Systems     Cardiac Risk Factors include: advanced age (>24men, >81 women);dyslipidemia;hypertension     Objective:    Today's Vitals   08/11/23 0851  Weight: 148 lb (67.1 kg)  Height: 5\' 4"  (1.626 m)   Body mass index is 25.4 kg/m.     08/11/2023    9:00 AM 08/06/2022   11:20 AM 08/05/2021    9:04 AM 06/26/2021   10:41 AM 08/02/2020    8:46 AM 02/22/2020    9:50 AM 08/02/2019    8:41 AM  Advanced Directives  Does Patient Have a Medical Advance Directive? Yes Yes Yes Yes Yes No Yes  Type of Estate agent of [place];Living will Healthcare Power of [place];Living will Healthcare Power of [place];Living will Healthcare Power of [place];Living will Healthcare Power of [place];Living will  Healthcare Power of [place];Living will  Does patient want to make changes to medical advance directive?  No - Patient declined No - Patient declined No - Patient declined No - Patient declined  No - Patient declined  Copy of Healthcare Power of Attorney in Chart? No - copy requested No - copy requested No - copy requested Yes - validated most recent copy scanned in chart (See row information) No - copy  requested  No - copy requested    Current Medications (verified) Outpatient Encounter Medications as of 08/11/2023  Medication Sig   acetaminophen (TYLENOL) 325 MG tablet Take 650 mg by mouth every 6 (six) hours as needed.   amLODipine (NORVASC) 10 MG tablet Take 1 tablet (10 mg total) by mouth daily.   atorvastatin (LIPITOR) 40 MG tablet Take 1 tablet (40 mg total) by mouth daily.   CALCIUM PO Take by mouth daily.   cetirizine (ZYRTEC) 10 MG tablet Take 10 mg by mouth daily.    Coenzyme Q10 (COQ10) 100 MG CAPS Take 1 capsule by mouth daily.   metroNIDAZOLE (METROGEL) 0.75 % gel Apply 1 Application topically at bedtime.   Misc Natural Products (OSTEO BI-FLEX ADV TRIPLE ST PO)    Multiple Vitamins-Minerals (CENTRUM SILVER ULTRA WOMENS PO) Take 1 tablet by mouth daily.   raloxifene (EVISTA) 60 MG tablet Take 1 tablet (60 mg total) by mouth daily.   vitamin B-12 (CYANOCOBALAMIN) 1000 MCG tablet Take 1,000 mcg by mouth daily.   No facility-administered encounter medications on file as of 08/11/2023.    Allergies (verified) Minocycline   History: Past Medical History:  Diagnosis Date   Chicken pox    Diverticulitis    Heart murmur    High cholesterol    Hypertension    Inflammatory polyps of colon (HCC)    Migraine    Urinary tract infection    Past Surgical History:  Procedure Laterality Date  APPENDECTOMY  1998   BUNIONECTOMY Left 01/08/2017   Procedure: Arthrodesis Metatarsalphalangeal joint MTPJ Left;  Surgeon: Recardo Evangelist, DPM;  Location: [facility];  Service: Podiatry;  Laterality: Left;   BUNIONECTOMY Right 06/26/2021   Procedure: BUNIONECTOMY- LAPIDUS TYPE, PHALANX OSTEOTOMY- AKIN;  Surgeon: Gwyneth Revels, DPM;  Location: [facility];  Service: Podiatry;  Laterality: Right;   HISTOPLASMOSIS     KNEE ARTHROSCOPY Left 08/02/2015   Procedure: ARTHROSCOPY KNEE, PARTIAL MEDIAL MENISCECTOMY;  Surgeon: Kennedy Bucker, MD;  Location: [facility];  Service:  Orthopedics;  Laterality: Left;   PARTIAL COLECTOMY     diverticular rupture, Renda Rolls    Family History  Problem Relation Age of Onset   Heart disease Father 17   Diabetes Father 47   Stomach cancer Maternal Grandfather    Cancer Neg Hx    Social History   Socioeconomic History   Marital status: Married    Spouse name: Not on file   Number of children: Not on file   Years of education: Not on file   Highest education level: Bachelor's degree (e.g., BA, AB, BS)  Occupational History   Not on file  Tobacco Use   Smoking status: Never   Smokeless tobacco: Never  Vaping Use   Vaping status: Never Used  Substance and Sexual Activity   Alcohol use: No   Drug use: Not on file   Sexual activity: Yes  Other Topics Concern   Not on file  Social History Narrative   Married   Social Determinants of Health   Financial Resource Strain: Low Risk  (08/08/2023)   Overall Financial Resource Strain (CARDIA)    Difficulty of Paying Living Expenses: Not hard at all  Food Insecurity: No Food Insecurity (08/08/2023)   Hunger Vital Sign    Worried About Running Out of Food in the Last Year: Never true    Ran Out of Food in the Last Year: Never true  Transportation Needs: No Transportation Needs (08/08/2023)   PRAPARE - Administrator, Civil Service (Medical): No    Lack of Transportation (Non-Medical): No  Physical Activity: Insufficiently Active (08/08/2023)   Exercise Vital Sign    Days of Exercise per Week: 1 day    Minutes of Exercise per Session: 30 min  Stress: No Stress Concern Present (08/08/2023)   [employer] of Occupational Health - Occupational Stress Questionnaire    Feeling of Stress : Only a little  Social Connections: Socially Integrated (08/08/2023)   Social Connection and Isolation Panel [NHANES]    Frequency of Communication with Friends and Family: Twice a week    Frequency of Social Gatherings with Friends and Family: Once a week    Attends  Religious Services: More than 4 times per year    Active Member of [employer] or Organizations: Yes    Attends Engineer, structural: More than 4 times per year    Marital Status: Married    Tobacco Counseling Counseling given: Not Answered   Clinical Intake:  Pre-visit preparation completed: Yes  Pain : No/denies pain     BMI - recorded: 25.4 Nutritional Status: BMI 25 -29 Overweight Nutritional Risks: None Diabetes: No  How often do you need to have someone help you when you read instructions, pamphlets, or other written materials from your doctor or pharmacy?: 1 - Never  Interpreter Needed?: No  Information entered by :: R. Kabir Brannock LPN   Activities of Daily Living    08/11/2023  8:52 AM 08/08/2023    7:04 PM  In your present state of health, do you have any difficulty performing the following activities:  Hearing? 0 0  Vision? 0   Comment glasses   Difficulty concentrating or making decisions? 0 0  Walking or climbing stairs? 0 0  Dressing or bathing? 0 0  Doing errands, shopping? 0 0  Preparing Food and eating ? N N  Using the Toilet? N N  In the past six months, have you accidently leaked urine? Y Y  Comment during the night at times   Do you have problems with loss of bowel control? N N  Managing your Medications? N N  Managing your Finances? N N  Housekeeping or managing your Housekeeping? N N    Patient Care Team: Sherlene Shams, MD as PCP - General (Internal Medicine)  Indicate any recent Medical Services you may have received from other than [facility] providers in the past year (date may be approximate).     Assessment:   This is a routine wellness examination for Mirena.  Hearing/Vision screen Hearing Screening - Comments:: No issues Vision Screening - Comments:: glasses  Dietary issues and exercise activities discussed:     Goals Addressed             This Visit's Progress    Patient Stated       Wants to loose some weight        Depression Screen    08/11/2023    8:56 AM 03/26/2023    8:36 AM 09/24/2022    9:13 AM 08/06/2022   11:21 AM 04/17/2022   11:04 AM 03/24/2022   10:29 AM 09/20/2021   10:09 AM  PHQ 2/9 Scores  PHQ - 2 Score 0 0 0 0 0 0 0  PHQ- 9 Score 0          Fall Risk    08/08/2023    7:04 PM 03/26/2023    8:35 AM 09/24/2022    9:13 AM 08/06/2022   11:24 AM 04/17/2022   11:04 AM  Fall Risk   Falls in the past year? 0 0 0 0 0  Number falls in past yr: 0 0     Injury with Fall? 0 0     Risk for fall due to :  No Fall Risks No Fall Risks  No Fall Risks  Follow up Falls evaluation completed;Falls prevention discussed Falls evaluation completed Falls evaluation completed Falls evaluation completed Falls evaluation completed    MEDICARE RISK AT HOME: Medicare Risk at Home Any stairs in or around the home?: No If so, are there any without handrails?: No Home free of loose throw rugs in walkways, pet beds, electrical cords, etc?: Yes Adequate lighting in your home to reduce risk of falls?: Yes Life alert?: No Use of a cane, walker or w/c?: No Grab bars in the bathroom?: Yes Shower chair or bench in shower?: Yes Elevated toilet seat or a handicapped toilet?: Yes   Cognitive Function:        08/11/2023    9:00 AM 08/06/2022   11:26 AM 08/02/2019    8:54 AM  6CIT Screen  What Year? 0 points  0 points  What month? 0 points  0 points  What time? 0 points  0 points  Count back from 20 0 points  0 points  Months in reverse 0 points 0 points 0 points  Repeat phrase 2 points  0 points  Total Score 2 points  0 points    Immunizations Immunization History  Administered Date(s) Administered   Influenza Split 09/15/2012, 09/28/2014, 08/26/2016   Influenza, High Dose Seasonal PF 09/01/2017, 08/18/2018, 07/28/2019, 09/07/2020   Influenza-Unspecified 08/15/2013, 09/18/2014, 08/28/2015, 08/27/2016, 09/01/2017, 09/17/2021, 09/18/2022   PFIZER Comirnaty(Gray Top)Covid-19 Tri-Sucrose Vaccine  01/07/2020, 01/28/2020   PFIZER(Purple Top)SARS-COV-2 Vaccination 01/07/2020, 01/28/2020, 09/07/2020, 04/03/2021   Pfizer Covid-19 Vaccine Bivalent Booster 49yrs & up 09/17/2021, 09/18/2022   Pneumococcal Conjugate-13 08/25/2014   Pneumococcal Polysaccharide-23 03/16/2013, 07/28/2019   Respiratory Syncytial Virus Vaccine,Recomb Aduvanted(Arexvy) 10/02/2022   Tdap 01/16/2011, 03/26/2023   Zoster Recombinant(Shingrix) 08/26/2022, 10/28/2022   Zoster, Live 01/16/2011    TDAP status: Up to date  Flu Vaccine status: Up to date  Pneumococcal vaccine status: Up to date  Covid-19 vaccine status: Completed vaccines  Qualifies for Shingles Vaccine? Yes   Zostavax completed Yes   Shingrix Completed?: Yes  Screening Tests Health Maintenance  Topic Date Due   COVID-19 Vaccine (9 - 2023-24 season) 11/13/2022   Medicare Annual Wellness (AWV)  08/07/2023   INFLUENZA VACCINE  07/16/2023   MAMMOGRAM  08/29/2023   Colonoscopy  03/18/2032   DTaP/Tdap/Td (3 - Td or Tdap) 03/25/2033   Pneumonia Vaccine 56+ Years old  Completed   DEXA SCAN  Completed   Hepatitis C Screening  Completed   Zoster Vaccines- Shingrix  Completed   HPV VACCINES  Aged Out    Health Maintenance  Health Maintenance Due  Topic Date Due   COVID-19 Vaccine (9 - 2023-24 season) 11/13/2022   Medicare Annual Wellness (AWV)  08/07/2023   INFLUENZA VACCINE  07/16/2023    Colorectal cancer screening: Type of screening: Colonoscopy. Completed 4/23. Repeat every 10 years  Mammogram status: Completed 9/23. Repeat every year  Bone Density status: Completed 6/24. Results reflect: Bone density results: OSTEOPOROSIS. Repeat every 2 years.  Lung Cancer Screening: (Low Dose CT Chest recommended if Age 29-80 years, 20 pack-year currently smoking OR have quit w/in 15years.) does not qualify.   Additional Screening:  Hepatitis C Screening: does qualify; Completed 9/16  Vision Screening: Recommended annual ophthalmology exams  for early detection of glaucoma and other disorders of the eye. Is the patient up to date with their annual eye exam?  Yes  Who is the provider or what is the name of the office in which the patient attends annual eye exams? [facility] If pt is not established with a provider, would they like to be referred to a provider to establish care? No .   Dental Screening: Recommended annual dental exams for proper oral hygiene  Community Resource Referral / Chronic Care Management: CRR required this visit?  No   CCM required this visit?  No     Plan:     I have personally reviewed and noted the following in the patient's chart:   Medical and social history Use of alcohol, tobacco or illicit drugs  Current medications and supplements including opioid prescriptions. Patient is not currently taking opioid prescriptions. Functional ability and status Nutritional status Physical activity Advanced directives List of other physicians Hospitalizations, surgeries, and ER visits in previous 12 months Vitals Screenings to include cognitive, depression, and falls Referrals and appointments  In addition, I have reviewed and discussed with patient certain preventive protocols, quality metrics, and best practice recommendations. A written personalized care plan for preventive services as well as general preventive health recommendations were provided to patient.     Sydell Axon, LPN   1/61/0960   After Visit Summary: (MyChart) Due to this  being a telephonic visit, the after visit summary with patients personalized plan was offered to patient via MyChart   Nurse Notes: None

## 2023-08-13 NOTE — Telephone Encounter (Signed)
Letter for pt's mom [name] has been printed and signed for pick up.  has been notified

## 2023-09-01 DIAGNOSIS — Z1231 Encounter for screening mammogram for malignant neoplasm of breast: Secondary | ICD-10-CM | POA: Diagnosis not present

## 2023-09-01 LAB — HM MAMMOGRAPHY

## 2023-09-20 DIAGNOSIS — Z23 Encounter for immunization: Secondary | ICD-10-CM | POA: Diagnosis not present

## 2023-09-25 ENCOUNTER — Ambulatory Visit: Payer: Medicare Other | Admitting: Internal Medicine

## 2023-09-25 ENCOUNTER — Encounter: Payer: Self-pay | Admitting: Internal Medicine

## 2023-09-25 VITALS — BP 120/70 | HR 70 | Temp 97.7°F | Ht 64.0 in | Wt 152.8 lb

## 2023-09-25 DIAGNOSIS — N763 Subacute and chronic vulvitis: Secondary | ICD-10-CM

## 2023-09-25 DIAGNOSIS — R7301 Impaired fasting glucose: Secondary | ICD-10-CM | POA: Diagnosis not present

## 2023-09-25 DIAGNOSIS — M25561 Pain in right knee: Secondary | ICD-10-CM

## 2023-09-25 DIAGNOSIS — Z79899 Other long term (current) drug therapy: Secondary | ICD-10-CM

## 2023-09-25 DIAGNOSIS — I10 Essential (primary) hypertension: Secondary | ICD-10-CM | POA: Diagnosis not present

## 2023-09-25 DIAGNOSIS — G8929 Other chronic pain: Secondary | ICD-10-CM | POA: Diagnosis not present

## 2023-09-25 DIAGNOSIS — E782 Mixed hyperlipidemia: Secondary | ICD-10-CM | POA: Diagnosis not present

## 2023-09-25 DIAGNOSIS — I351 Nonrheumatic aortic (valve) insufficiency: Secondary | ICD-10-CM | POA: Diagnosis not present

## 2023-09-25 DIAGNOSIS — M810 Age-related osteoporosis without current pathological fracture: Secondary | ICD-10-CM | POA: Diagnosis not present

## 2023-09-25 LAB — LIPID PANEL
Cholesterol: 156 mg/dL (ref 0–200)
HDL: 72.4 mg/dL (ref >39.00)
LDL Cholesterol: 69 mg/dL (ref 0–99)
NonHDL: 83.74
Total CHOL/HDL Ratio: 2
Triglycerides: 72 mg/dL (ref 0.0–149.0)
VLDL: 14.4 mg/dL (ref 0.0–40.0)

## 2023-09-25 LAB — HEMOGLOBIN A1C: Hgb A1c MFr Bld: 5.5 % (ref 4.6–6.5)

## 2023-09-25 LAB — COMPREHENSIVE METABOLIC PANEL
ALT: 22 U/L (ref 0–35)
AST: 21 U/L (ref 0–37)
Albumin: 4.4 g/dL (ref 3.5–5.2)
Alkaline Phosphatase: 46 U/L (ref 39–117)
BUN: 17 mg/dL (ref 6–23)
CO2: 28 meq/L (ref 19–32)
Calcium: 9.4 mg/dL (ref 8.4–10.5)
Chloride: 105 meq/L (ref 96–112)
Creatinine, Ser: 0.66 mg/dL (ref 0.40–1.20)
GFR: 85.42 mL/min (ref >60.00)
Glucose, Bld: 88 mg/dL (ref 70–99)
Potassium: 4.9 meq/L (ref 3.5–5.1)
Sodium: 141 meq/L (ref 135–145)
Total Bilirubin: 0.4 mg/dL (ref 0.2–1.2)
Total Protein: 6.5 g/dL (ref 6.0–8.3)

## 2023-09-25 LAB — CBC WITH DIFFERENTIAL/PLATELET
Basophils Absolute: 0 10*3/uL (ref 0.0–0.1)
Basophils Relative: 0.5 % (ref 0.0–3.0)
Eosinophils Absolute: 0.1 10*3/uL (ref 0.0–0.7)
Eosinophils Relative: 1.8 % (ref 0.0–5.0)
HCT: 43.8 % (ref 36.0–46.0)
Hemoglobin: 14.3 g/dL (ref 12.0–15.0)
Lymphocytes Relative: 25.5 % (ref 12.0–46.0)
Lymphs Abs: 1.1 10*3/uL (ref 0.7–4.0)
MCHC: 32.6 g/dL (ref 30.0–36.0)
MCV: 102.2 fL — ABNORMAL HIGH (ref 78.0–100.0)
Monocytes Absolute: 0.4 10*3/uL (ref 0.1–1.0)
Monocytes Relative: 9.6 % (ref 3.0–12.0)
Neutro Abs: 2.6 10*3/uL (ref 1.4–7.7)
Neutrophils Relative %: 62.6 % (ref 43.0–77.0)
Platelets: 200 10*3/uL (ref 150.0–400.0)
RBC: 4.29 Mil/uL (ref 3.87–5.11)
RDW: 13 % (ref 11.5–15.5)
WBC: 4.2 10*3/uL (ref 4.0–10.5)

## 2023-09-25 LAB — LDL CHOLESTEROL, DIRECT: Direct LDL: 64 mg/dL

## 2023-09-25 LAB — MICROALBUMIN / CREATININE URINE RATIO
Creatinine,U: 74.5 mg/dL
Microalb Creat Ratio: 0.9 mg/g (ref 0.0–30.0)
Microalb, Ur: <0.7 mg/dL (ref 0.0–1.9)

## 2023-09-25 LAB — TSH: TSH: 0.31 u[IU]/mL — ABNORMAL LOW (ref 0.35–5.50)

## 2023-09-25 MED ORDER — AMLODIPINE BESYLATE 10 MG PO TABS: 10.0000 mg | ORAL_TABLET | Freq: Every day | ORAL | 1 refills | Status: DC

## 2023-09-25 MED ORDER — ALENDRONATE SODIUM 70 MG PO TABS: 70.0000 mg | ORAL_TABLET | ORAL | 11 refills | Status: DC

## 2023-09-25 NOTE — Assessment & Plan Note (Signed)
Well controlled on current regimen of amlodipine 10 mg daily . Renal function stable, no changes today.

## 2023-09-25 NOTE — Progress Notes (Signed)
Subjective:  Patient ID: Shannon Hickman, female    DOB: Oct 07, 1946  Age: 77 y.o. MRN: [id number]  CC: The primary encounter diagnosis was Essential hypertension. Diagnoses of Mixed hyperlipidemia, Impaired fasting glucose, Encounter for long-term (current) use of high-risk medication, Age-related osteoporosis without current pathological fracture, Chronic pain of right knee, Chronic vulvitis, and Nonrheumatic aortic valve insufficiency were also pertinent to this visit.   HPI Shannon Hickman presents for  Chief Complaint  Patient presents with   Medical Management of Chronic Issues    6 month follow up    1) HTN:  she has bee  taking amlodipine 10 mg daily. BP has been fluctuating   due to exertion ,  highest reading at home has bee 138/ 72  2) osteoporosis :  has been taking raloxifene for several  years,  having  hot flashes and no improvement in  t scores,  actually worsened  in the hip    3)  moved her mother Shannon Hickman to [facility] . Nellie is now using a walker,  adjusting to the new SOCIAL environment.    4) bilateral knee pain prevents her from walking regularly for exercise   Outpatient Medications Prior to Visit  Medication Sig Dispense Refill   acetaminophen (TYLENOL) 325 MG tablet Take 650 mg by mouth every 6 (six) hours as needed.     atorvastatin (LIPITOR) 40 MG tablet Take 1 tablet (40 mg total) by mouth daily. 90 tablet 3   CALCIUM PO Take by mouth daily.     cetirizine (ZYRTEC) 10 MG tablet Take 10 mg by mouth daily.      Coenzyme Q10 (COQ10) 100 MG CAPS Take 1 capsule by mouth daily.     metroNIDAZOLE (METROGEL) 0.75 % gel Apply 1 Application topically at bedtime.     Misc Natural Products (OSTEO BI-FLEX ADV TRIPLE ST PO)      Multiple Vitamins-Minerals (CENTRUM SILVER ULTRA WOMENS PO) Take 1 tablet by mouth daily.     raloxifene (EVISTA) 60 MG tablet Take 1 tablet (60 mg total) by mouth daily. 90 tablet 1   vitamin B-12 (CYANOCOBALAMIN) 1000 MCG tablet Take  1,000 mcg by mouth daily.     amLODipine (NORVASC) 10 MG tablet Take 1 tablet (10 mg total) by mouth daily. 90 tablet 1   No facility-administered medications prior to visit.    Review of Systems;  Patient denies headache, fevers, malaise, unintentional weight loss, skin rash, eye pain, sinus congestion and sinus pain, sore throat, dysphagia,  hemoptysis , cough, dyspnea, wheezing, chest pain, palpitations, orthopnea, edema, abdominal pain, nausea, melena, diarrhea, constipation, flank pain, dysuria, hematuria, urinary  Frequency, nocturia, numbness, tingling, seizures,  Focal weakness, Loss of consciousness,  Tremor, insomnia, depression, anxiety, and suicidal ideation.      Objective:  BP 120/70   Pulse 70   Temp 97.7 F (36.5 C) (Oral)   Ht 5\' 4"  (1.626 m)   Wt 152 lb 12.8 oz (69.3 kg)   SpO2 98%   BMI 26.23 kg/m   BP Readings from Last 3 Encounters:  09/25/23 120/70  03/26/23 (!) 140/70  01/05/23 (!) 143/64    Wt Readings from Last 3 Encounters:  09/25/23 152 lb 12.8 oz (69.3 kg)  08/11/23 148 lb (67.1 kg)  03/26/23 153 lb (69.4 kg)    Physical Exam Vitals reviewed.  Constitutional:      General: She is not in acute distress.    Appearance: Normal appearance. She is normal weight.  She is not ill-appearing, toxic-appearing or diaphoretic.  HENT:     Head: Normocephalic.  Eyes:     General: No scleral icterus.       Right eye: No discharge.        Left eye: No discharge.     Conjunctiva/sclera: Conjunctivae normal.  Cardiovascular:     Rate and Rhythm: Normal rate and regular rhythm.     Heart sounds: Normal heart sounds.  Pulmonary:     Effort: Pulmonary effort is normal. No respiratory distress.     Breath sounds: Normal breath sounds.  Musculoskeletal:        General: Normal range of motion.  Skin:    General: Skin is warm and dry.  Neurological:     General: No focal deficit present.     Mental Status: She is alert and oriented to person, place, and  time. Mental status is at baseline.  Psychiatric:        Mood and Affect: Mood normal.        Behavior: Behavior normal.        Thought Content: Thought content normal.        Judgment: Judgment normal.    Lab Results  Component Value Date   HGBA1C 5.5 09/25/2023   HGBA1C 5.7 09/24/2022   HGBA1C 5.3 09/16/2021    Lab Results  Component Value Date   CREATININE 0.66 09/25/2023   CREATININE 0.69 03/26/2023   CREATININE 0.63 09/24/2022    Lab Results  Component Value Date   WBC 4.2 09/25/2023   HGB 14.3 09/25/2023   HCT 43.8 09/25/2023   PLT 200.0 09/25/2023   GLUCOSE 88 09/25/2023   CHOL 156 09/25/2023   TRIG 72.0 09/25/2023   HDL 72.40 09/25/2023   LDLDIRECT 64.0 09/25/2023   LDLCALC 69 09/25/2023   ALT 22 09/25/2023   AST 21 09/25/2023   NA 141 09/25/2023   K 4.9 09/25/2023   CL 105 09/25/2023   CREATININE 0.66 09/25/2023   BUN 17 09/25/2023   CO2 28 09/25/2023   TSH 0.31 (L) 09/25/2023   HGBA1C 5.5 09/25/2023   MICROALBUR <0.7 09/25/2023    DG Bone Density  Result Date: 06/10/2023 EXAM: DUAL X-RAY ABSORPTIOMETRY (DXA) FOR BONE MINERAL DENSITY IMPRESSION: Your patient Shannon Hickman completed a BMD test on 06/10/2023 using the [employer] iDXA DXA System (software version: 14.10) manufactured by [employer]. The following summarizes the results of our evaluation. Technologist: SCE PATIENT BIOGRAPHICAL: Name: Shannon, Hickman Patient ID: [id number] Birth Date: 02-11-1946 Height: 62.5 in. Gender: Female Exam Date: 06/10/2023 Weight: 153.9 lbs. Indications: Advanced Age, Caucasian, Family Hx of Osteoporosis, History of Osteoporosis, Osteoarthritis, Postmenopausal Fractures: Treatments: Calcium, Evista, Multi-Vitamin, ZYRTEC DENSITOMETRY RESULTS: Site      Region     Measured Date Measured Age WHO Classification Young Adult T-score BMD         %Change vs. Previous Significant Change (*) AP Spine L1-L2 06/10/2023 75.7 Osteopenia -1.8 0.957 g/cm2 1.8% - AP Spine L1-L2  12/23/2021 74.3 Osteopenia -1.9 0.940 g/cm2 1.2% - AP Spine L1-L2 12/19/2019 72.3 Osteopenia -2.0 0.929 g/cm2 -2.5% - AP Spine L1-L2 10/22/2016 69.1 Osteopenia -1.8 0.953 g/cm2 - - DualFemur Neck Right 06/10/2023 75.7 Osteoporosis -2.7 0.660 g/cm2 -5.8% - DualFemur Neck Right 12/23/2021 74.3 Osteopenia -2.4 0.701 g/cm2 1.6% - DualFemur Neck Right 12/19/2019 72.3 Osteoporosis -2.5 0.690 g/cm2 -3.8% - DualFemur Neck Right 10/22/2016 69.1 Osteopenia -2.3 0.717 g/cm2 - - DualFemur Total Mean 06/10/2023 75.7 Osteopenia -1.9 0.769 g/cm2 -0.4% -  DualFemur Total Mean 12/23/2021 74.3 Osteopenia -1.9 0.772 g/cm2 -1.2% - DualFemur Total Mean 12/19/2019 72.3 Osteopenia -1.8 0.781 g/cm2 -4.3% Yes DualFemur Total Mean 10/22/2016 69.1 Osteopenia -1.5 0.816 g/cm2 - - ASSESSMENT: The BMD measured at Femur Neck Right is 0.660 g/cm2 with a T-score of -2.7. This patient is considered osteoporotic according to World Health Organization [facility]) criteria. The scan quality is good. L-3 and L-4 was excluded due to degenerative changes. Compared with prior study, there has been no significant change in the spine. Compared with prior study, there has been no significant change in the total hip. World Science writer [facility]) criteria for post-menopausal, Caucasian Women: Normal:                   T-score at or above -1 SD Osteopenia/low bone mass: T-score between -1 and -2.5 SD Osteoporosis:             T-score at or below -2.5 SD RECOMMENDATIONS: 1. All patients should optimize calcium and vitamin D intake. 2. Consider FDA-approved medical therapies in postmenopausal women and men aged 27 years and older, based on the following: a. A hip or vertebral(clinical or morphometric) fracture b. T-score < -2.5 at the femoral neck or spine after appropriate evaluation to exclude secondary causes c. Low bone mass (T-score between -1.0 and -2.5 at the femoral neck or spine) and a 10-year probability of a hip fracture > 3% or a 10-year probability of a  major osteoporosis-related fracture > 20% based on the [place]-adapted WHO algorithm 3. Clinician judgment and/or patient preferences may indicate treatment for people with 10-year fracture probabilities above or below these levels FOLLOW-UP: People with diagnosed cases of osteoporosis or at high risk for fracture should have regular bone mineral density tests. For patients eligible for Medicare, routine testing is allowed once every 2 years. The testing frequency can be increased to one year for patients who have rapidly progressing disease, those who are receiving or discontinuing medical therapy to restore bone mass, or have additional risk factors. I have reviewed this report, and agree with the above findings. [facility] Electronically Signed   By: Baird Lyons M.D.   On: 06/10/2023 08:38    Assessment & Plan:  .Essential hypertension Assessment & Plan: Well controlled on current regimen of amlodipine 10 mg daily . Renal function stable, no changes today.   Orders: -     Comprehensive metabolic panel -     Microalbumin / creatinine urine ratio  Mixed hyperlipidemia Assessment & Plan: Controlled on  80 mg atorvastatin daily.  LFTs normal.   Lab Results  Component Value Date   CHOL 156 09/25/2023   HDL 72.40 09/25/2023   LDLCALC 69 09/25/2023   LDLDIRECT 64.0 09/25/2023   TRIG 72.0 09/25/2023   CHOLHDL 2 09/25/2023   Lab Results  Component Value Date   ALT 22 09/25/2023   AST 21 09/25/2023   ALKPHOS 46 09/25/2023   BILITOT 0.4 09/25/2023     Orders: -     Lipid panel -     LDL cholesterol, direct  Impaired fasting glucose -     Comprehensive metabolic panel -     Hemoglobin A1c  Encounter for long-term (current) use of high-risk medication -     TSH -     CBC with Differential/Platelet  Age-related osteoporosis without current pathological fracture Assessment & Plan: Discussed change in therapy from raloxifene to alendronate  due to treatment failure and  side effects  Chronic pain of right knee Assessment & Plan: Secondary to OA per Orthopedics.  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  continue clobetasol prn flares   Nonrheumatic aortic valve insufficiency Assessment & Plan: Mild,  with mild TR and MR by July 2025 ECHO ([facility])   she remains asymptomatic,  EF is 60%    Other orders -     amLODIPine Besylate; Take 1 tablet (10 mg total) by mouth daily.  Dispense: 90 tablet; Refill: 1 -     Alendronate Sodium; Take 1 tablet (70 mg total) by mouth every 7 (seven) days. Take with a full glass of water on an empty stomach.  Dispense: 4 tablet; Refill: 11     I provided 30 minutes of face-to-face time during this encounter reviewing patient's last visit with me, patient's  most recent visit with cardiology,  recent surgical and non surgical procedures, previous  labs and imaging studies, counseling on currently addressed issues,  and post visit ordering to diagnostics and therapeutics .   Follow-up: Return in about 6 months (around 03/25/2024).   Sherlene Shams, MD

## 2023-09-25 NOTE — Assessment & Plan Note (Addendum)
Discussed change in therapy from raloxifene to alendronate  due to treatment failure and side effects

## 2023-09-25 NOTE — Patient Instructions (Signed)
We are changing your osteoporosis therapy to alendronate.  You can stop the Evista.   Tell your husband to ask his doctor to evaluate his swallowing issues.  Alendronate can cause esophagitis if taken by a person whose swallow function has become disorganized

## 2023-09-27 NOTE — Assessment & Plan Note (Signed)
Controlled on  80 mg atorvastatin daily.  LFTs normal.   Lab Results  Component Value Date   CHOL 156 09/25/2023   HDL 72.40 09/25/2023   LDLCALC 69 09/25/2023   LDLDIRECT 64.0 09/25/2023   TRIG 72.0 09/25/2023   CHOLHDL 2 09/25/2023   Lab Results  Component Value Date   ALT 22 09/25/2023   AST 21 09/25/2023   ALKPHOS 46 09/25/2023   BILITOT 0.4 09/25/2023

## 2023-09-27 NOTE — Assessment & Plan Note (Signed)
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  continue clobetasol prn flares

## 2023-09-27 NOTE — Assessment & Plan Note (Signed)
Secondary to OA per Orthopedics.  encouraged to use tylenol scheduled and try to walk daily

## 2023-09-27 NOTE — Assessment & Plan Note (Signed)
Mild,  with mild TR and MR by July 2025 ECHO ([facility])   she remains asymptomatic,  EF is 60%

## 2023-10-16 DIAGNOSIS — H43813 Vitreous degeneration, bilateral: Secondary | ICD-10-CM | POA: Diagnosis not present

## 2023-10-16 DIAGNOSIS — H2513 Age-related nuclear cataract, bilateral: Secondary | ICD-10-CM | POA: Diagnosis not present

## 2023-10-16 DIAGNOSIS — H538 Other visual disturbances: Secondary | ICD-10-CM | POA: Diagnosis not present

## 2023-10-16 DIAGNOSIS — H04123 Dry eye syndrome of bilateral lacrimal glands: Secondary | ICD-10-CM | POA: Diagnosis not present

## 2023-11-03 DIAGNOSIS — H2511 Age-related nuclear cataract, right eye: Secondary | ICD-10-CM | POA: Diagnosis not present

## 2023-11-05 ENCOUNTER — Encounter: Payer: Self-pay | Admitting: Ophthalmology

## 2023-11-10 ENCOUNTER — Ambulatory Visit: Payer: Medicare Other | Admitting: Dermatology

## 2023-11-10 ENCOUNTER — Encounter: Payer: Self-pay | Admitting: Dermatology

## 2023-11-10 DIAGNOSIS — D2272 Melanocytic nevi of left lower limb, including hip: Secondary | ICD-10-CM | POA: Diagnosis not present

## 2023-11-10 DIAGNOSIS — L814 Other melanin hyperpigmentation: Secondary | ICD-10-CM

## 2023-11-10 DIAGNOSIS — D229 Melanocytic nevi, unspecified: Secondary | ICD-10-CM | POA: Diagnosis not present

## 2023-11-10 DIAGNOSIS — L821 Other seborrheic keratosis: Secondary | ICD-10-CM

## 2023-11-10 DIAGNOSIS — W908XXA Exposure to other nonionizing radiation, initial encounter: Secondary | ICD-10-CM

## 2023-11-10 DIAGNOSIS — D224 Melanocytic nevi of scalp and neck: Secondary | ICD-10-CM

## 2023-11-10 DIAGNOSIS — Z1283 Encounter for screening for malignant neoplasm of skin: Secondary | ICD-10-CM

## 2023-11-10 DIAGNOSIS — D1801 Hemangioma of skin and subcutaneous tissue: Secondary | ICD-10-CM

## 2023-11-10 DIAGNOSIS — L578 Other skin changes due to chronic exposure to nonionizing radiation: Secondary | ICD-10-CM | POA: Diagnosis not present

## 2023-11-10 DIAGNOSIS — D239 Other benign neoplasm of skin, unspecified: Secondary | ICD-10-CM | POA: Diagnosis not present

## 2023-11-10 DIAGNOSIS — L719 Rosacea, unspecified: Secondary | ICD-10-CM

## 2023-11-10 NOTE — Progress Notes (Signed)
Follow-Up Visit   Subjective  Shannon Hickman is a 77 y.o. female who presents for the following: Skin Cancer Screening and Full Body Skin Exam. No personal hx of skin cancer or dysplastic nevus.   The patient presents for Total-Body Skin Exam (TBSE) for skin cancer screening and mole check. The patient has spots, moles and lesions to be evaluated, some may be new or changing and the patient may have concern these could be cancer.    The following portions of the chart were reviewed this encounter and updated as appropriate: medications, allergies, medical history  Review of Systems:  No other skin or systemic complaints except as noted in HPI or Assessment and Plan.  Objective  Well appearing patient in no apparent distress; mood and affect are within normal limits.  A full examination was performed including scalp, head, eyes, ears, nose, lips, neck, chest, axillae, abdomen, back, buttocks, bilateral upper extremities, bilateral lower extremities, hands, feet, fingers, toes, fingernails, and toenails. All findings within normal limits unless otherwise noted below.   Relevant physical exam findings are noted in the Assessment and Plan.    Assessment & Plan   SKIN CANCER SCREENING PERFORMED TODAY.  ACTINIC DAMAGE. Chest. - Chronic condition, secondary to cumulative UV/sun exposure - diffuse scaly erythematous macules with underlying dyspigmentation - Recommend daily broad spectrum sunscreen SPF 30+ to sun-exposed areas, reapply every 2 hours as needed.  - Staying in the shade or wearing long sleeves, sun glasses (UVA+UVB protection) and wide brim hats (4-inch brim around the entire circumference of the hat) are also recommended for sun protection.  - Call for new or changing lesions.  LENTIGINES, SEBORRHEIC KERATOSES, HEMANGIOMAS - Benign normal skin lesions. Torso  - Benign-appearing - Call for any changes  MELANOCYTIC NEVI - Tan-brown and/or pink-flesh-colored symmetric  macules and papules - Benign appearing on exam today - Observation - Call clinic for new or changing moles - Recommend daily use of broad spectrum spf 30+ sunscreen to sun-exposed areas.   MELANOCYTIC NEVI Exam:  R ant neck 2.47mm med brown macule 2 tone with notch  Left pretibia 3 mm brown macule  Treatment Plan: Benign-appearing. Stable compared to previous visit. Observation.  Call clinic for new or changing moles.  Recommend daily use of broad spectrum spf 30+ sunscreen to sun-exposed areas.     DERMATOFIBROMA Exam: Firm pink/brown papulenodule with dimple sign. Treatment Plan: A dermatofibroma is a benign growth possibly related to trauma, such as an insect bite, cut from shaving, or inflamed acne-type bump.  Treatment options to remove include shave or excision with resulting scar and risk of recurrence.  Since benign-appearing and not bothersome, will observe for now.   SEBORRHEIC KERATOSIS - Stuck-on, waxy, tan-brown papules and/or plaques. Right anterior thigh, left inguinal crease, inframammary, left temporal hair line.   - Benign-appearing - Discussed benign etiology and prognosis. 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 Condition is improving with treatment but not currently at goal.   Rosacea is a chronic progressive skin condition usually affecting the face of adults, causing redness and/or acne bumps. It is treatable but not curable. It sometimes affects the eyes (ocular rosacea) as well. It may respond to topical and/or systemic medication and can flare with stress, sun exposure, alcohol, exercise, topical steroids (including hydrocortisone/cortisone 10) and some foods.  Daily application  of broad spectrum spf 30+ sunscreen to face is recommended to reduce flares.  Patient denies grittiness  of the eyes  Treatment Plan Continue Metronidazole 0.75% gel as directed by Dr. Darrick Huntsman.  Discussed changing to different topical. Patient prefers to continue Metronidazole 0.75% gel at this time.  Patient will call for refills.    Return in about 1 year (around 11/09/2024) for TBSE.  I, Lawson Radar, CMA, am acting as scribe for Willeen Niece, MD.   Documentation: I have reviewed the above documentation for accuracy and completeness, and I agree with the above.  Willeen Niece, MD

## 2023-11-10 NOTE — Patient Instructions (Signed)

## 2023-11-16 NOTE — Discharge Instructions (Signed)

## 2023-11-18 ENCOUNTER — Ambulatory Visit: Payer: Medicare Other | Admitting: Anesthesiology

## 2023-11-18 ENCOUNTER — Ambulatory Visit
Admission: RE | Admit: 2023-11-18 | Discharge: 2023-11-18 | Disposition: A | Discharge status: Home / Self Care | Payer: Medicare Other | Attending: Ophthalmology | Admitting: Ophthalmology

## 2023-11-18 ENCOUNTER — Encounter: Payer: Self-pay | Admitting: Ophthalmology

## 2023-11-18 ENCOUNTER — Encounter
Admission: RE | Disposition: A | Discharge status: Home / Self Care | Payer: Self-pay | Source: Home / Self Care | Attending: Ophthalmology

## 2023-11-18 ENCOUNTER — Other Ambulatory Visit: Payer: Self-pay

## 2023-11-18 DIAGNOSIS — E1136 Type 2 diabetes mellitus with diabetic cataract: Secondary | ICD-10-CM | POA: Diagnosis not present

## 2023-11-18 DIAGNOSIS — H2511 Age-related nuclear cataract, right eye: Secondary | ICD-10-CM | POA: Diagnosis not present

## 2023-11-18 DIAGNOSIS — Z79899 Other long term (current) drug therapy: Secondary | ICD-10-CM | POA: Insufficient documentation

## 2023-11-18 DIAGNOSIS — I10 Essential (primary) hypertension: Secondary | ICD-10-CM | POA: Diagnosis not present

## 2023-11-18 HISTORY — PX: CATARACT EXTRACTION W/PHACO: SHX586

## 2023-11-18 SURGERY — PHACOEMULSIFICATION, CATARACT, WITH IOL INSERTION
Anesthesia: Monitor Anesthesia Care | Laterality: Right

## 2023-11-18 MED ORDER — MIDAZOLAM HCL 2 MG/2ML IJ SOLN
INTRAMUSCULAR | Status: AC
Start: 2023-11-18 — End: ?
  Filled 2023-11-18: qty 2

## 2023-11-18 MED ORDER — SIGHTPATH DOSE#1 BSS IO SOLN
INTRAOCULAR | Status: DC | PRN
  Administered 2023-11-18: 47 mL via OPHTHALMIC

## 2023-11-18 MED ORDER — TETRACAINE HCL 0.5 % OP SOLN
1.0000 [drp] | OPHTHALMIC | Status: DC | PRN
  Administered 2023-11-18 (×3): 1 [drp] via OPHTHALMIC

## 2023-11-18 MED ORDER — SODIUM CHLORIDE 0.9% FLUSH: 10.0000 mL | Freq: Two times a day (BID) | INTRAVENOUS | Status: DC

## 2023-11-18 MED ORDER — SIGHTPATH DOSE#1 NA HYALUR & NA CHOND-NA HYALUR IO KIT
PACK | INTRAOCULAR | Status: DC | PRN
  Administered 2023-11-18: 1 via OPHTHALMIC

## 2023-11-18 MED ORDER — ONDANSETRON HCL 4 MG/2ML IJ SOLN
INTRAMUSCULAR | Status: AC
  Filled 2023-11-18: qty 2

## 2023-11-18 MED ORDER — ARMC OPHTHALMIC DILATING DROPS
1.0000 | OPHTHALMIC | Status: DC | PRN
  Administered 2023-11-18 (×3): 1 via OPHTHALMIC

## 2023-11-18 MED ORDER — CEFUROXIME OPHTHALMIC INJECTION 1 MG/0.1 ML
INJECTION | OPHTHALMIC | Status: DC | PRN
  Administered 2023-11-18: 1 mg via INTRACAMERAL

## 2023-11-18 MED ORDER — MIDAZOLAM HCL 2 MG/2ML IJ SOLN
INTRAMUSCULAR | Status: DC | PRN
  Administered 2023-11-18 (×2): 1 mg via INTRAVENOUS

## 2023-11-18 MED ORDER — DEXAMETHASONE SODIUM PHOSPHATE 4 MG/ML IJ SOLN
INTRAMUSCULAR | Status: AC
  Filled 2023-11-18: qty 1

## 2023-11-18 MED ORDER — FENTANYL CITRATE (PF) 100 MCG/2ML IJ SOLN
INTRAMUSCULAR | Status: AC
  Filled 2023-11-18: qty 2

## 2023-11-18 MED ORDER — TETRACAINE HCL 0.5 % OP SOLN
OPHTHALMIC | Status: AC
  Filled 2023-11-18: qty 4

## 2023-11-18 MED ORDER — BRIMONIDINE TARTRATE-TIMOLOL 0.2-0.5 % OP SOLN
OPHTHALMIC | Status: DC | PRN
  Administered 2023-11-18: 1 [drp] via OPHTHALMIC

## 2023-11-18 MED ORDER — FENTANYL CITRATE (PF) 100 MCG/2ML IJ SOLN
INTRAMUSCULAR | Status: DC | PRN
  Administered 2023-11-18: 50 ug via INTRAVENOUS

## 2023-11-18 MED ORDER — SIGHTPATH DOSE#1 BSS IO SOLN
INTRAOCULAR | Status: DC | PRN
  Administered 2023-11-18: 15 mL via INTRAOCULAR

## 2023-11-18 MED ORDER — SIGHTPATH DOSE#1 BSS IO SOLN
INTRAOCULAR | Status: DC | PRN
  Administered 2023-11-18: 2 mL

## 2023-11-18 SURGICAL SUPPLY — 9 items
CATARACT SUITE SIGHTPATH (MISCELLANEOUS) ×1
FEE CATARACT SUITE SIGHTPATH (MISCELLANEOUS) ×1 IMPLANT
GLOVE SRG 8 PF TXTR STRL LF DI (GLOVE) ×1 IMPLANT
GLOVE SURG ENC TEXT LTX SZ7.5 (GLOVE) ×1 IMPLANT
LENS IOL TECNIS EYHANCE 19.5 (Intraocular Lens) IMPLANT
NDL FILTER BLUNT 18X1 1/2 (NEEDLE) ×1 IMPLANT
NEEDLE FILTER BLUNT 18X1 1/2 (NEEDLE) ×1
SYR 3ML LL SCALE MARK (SYRINGE) ×1 IMPLANT
TIP IRRIGATON/ASPIRATION (MISCELLANEOUS) IMPLANT

## 2023-11-18 NOTE — H&P (Signed)
[facility]   Primary Care Physician:  Sherlene Shams, MD Ophthalmologist: Dr. Lockie Mola  Pre-Procedure History & Physical: HPI:  Shannon Hickman is a 77 y.o. female here for ophthalmic surgery.   Past Medical History:  Diagnosis Date   Chicken pox    Diverticulitis    Heart murmur    High cholesterol    Hypertension    Inflammatory polyps of colon (HCC)    Migraine    Urinary tract infection     Past Surgical History:  Procedure Laterality Date   APPENDECTOMY  1998   BUNIONECTOMY Left 01/08/2017   Procedure: Arthrodesis Metatarsalphalangeal joint MTPJ Left;  Surgeon: Recardo Evangelist, DPM;  Location: [facility];  Service: Podiatry;  Laterality: Left;   BUNIONECTOMY Right 06/26/2021   Procedure: BUNIONECTOMY- LAPIDUS TYPE, PHALANX OSTEOTOMY- AKIN;  Surgeon: Gwyneth Revels, DPM;  Location: [facility];  Service: Podiatry;  Laterality: Right;   HISTOPLASMOSIS     KNEE ARTHROSCOPY Left 08/02/2015   Procedure: ARTHROSCOPY KNEE, PARTIAL MEDIAL MENISCECTOMY;  Surgeon: Kennedy Bucker, MD;  Location: [facility] OR[facility];  Service: Orthopedics;  Laterality: Left;   PARTIAL COLECTOMY     diverticular rupture, Renda Rolls     Prior to Admission medications   Medication Sig Start Date End Date Taking? Authorizing Provider  alendronate (FOSAMAX) 70 MG tablet Take 1 tablet (70 mg total) by mouth every 7 (seven) days. Take with a full glass of water on an empty stomach. 09/25/23  Yes Sherlene Shams, MD  amLODipine (NORVASC) 10 MG tablet Take 1 tablet (10 mg total) by mouth daily. 09/25/23  Yes Sherlene Shams, MD  atorvastatin (LIPITOR) 40 MG tablet Take 1 tablet (40 mg total) by mouth daily. 03/26/23  Yes Sherlene Shams, MD  CALCIUM PO Take by mouth daily.   Yes [provider]  cetirizine (ZYRTEC) 10 MG tablet Take 10 mg by mouth daily.    Yes [provider]  Coenzyme Q10 (COQ10) 100 MG CAPS Take 1 capsule by mouth daily.   Yes [provider]  metroNIDAZOLE (METROGEL) 0.75 % gel Apply 1 Application topically at bedtime.   Yes [provider]  Misc Natural Products (OSTEO BI-FLEX ADV TRIPLE ST PO)  08/15/22  Yes [provider]  Multiple Vitamins-Minerals (CENTRUM SILVER ULTRA WOMENS PO) Take 1 tablet by mouth daily.   Yes [provider]  vitamin B-12 (CYANOCOBALAMIN) 1000 MCG tablet Take 1,000 mcg by mouth daily.   Yes [provider]  acetaminophen (TYLENOL) 325 MG tablet Take 650 mg by mouth every 6 (six) hours as needed.    [provider]    Allergies as of 10/20/2023 - Review Complete 09/25/2023  Allergen Reaction Noted   Minocycline  09/20/2012    Family History  Problem Relation Age of Onset   Heart disease Father 35   Diabetes Father 45   Stomach cancer Maternal Grandfather    Cancer Neg Hx     Social History   Socioeconomic History   Marital status: Married    Spouse name: Not on file   Number of children: Not on file   Years of education: Not on file   Highest education level: Bachelor's degree (e.g., BA, AB, BS)  Occupational History   Not on file  Tobacco Use   Smoking status: Never   Smokeless tobacco: Never  Vaping Use   Vaping status: Never Used  Substance and Sexual Activity   Alcohol use: No   Drug  use: Not on file   Sexual activity: Yes  Other Topics Concern   Not on file  Social History Narrative   Married   Social Determinants of Health   Financial Resource Strain: Low Risk  (08/08/2023)   Overall Financial Resource Strain (CARDIA)    Difficulty of Paying Living Expenses: Not hard at all  Food Insecurity: No Food Insecurity (08/08/2023)   Hunger Vital Sign    Worried About Running Out of Food in the Last Year: Never true    Ran Out of Food in the Last Year: Never true  Transportation Needs: No Transportation Needs (08/08/2023)   PRAPARE - Administrator, Civil Service (Medical): No    Lack of Transportation  (Non-Medical): No  Physical Activity: Insufficiently Active (08/08/2023)   Exercise Vital Sign    Days of Exercise per Week: 1 day    Minutes of Exercise per Session: 30 min  Stress: No Stress Concern Present (08/08/2023)   [employer] of Occupational Health - Occupational Stress Questionnaire    Feeling of Stress : Only a little  Social Connections: Socially Integrated (08/08/2023)   Social Connection and Isolation Panel [NHANES]    Frequency of Communication with Friends and Family: Twice a week    Frequency of Social Gatherings with Friends and Family: Once a week    Attends Religious Services: More than 4 times per year    Active Member of [employer] or Organizations: Yes    Attends Engineer, structural: More than 4 times per year    Marital Status: Married  Catering manager Violence: Not At Risk (08/11/2023)   Humiliation, Afraid, Rape, and Kick questionnaire    Fear of Current or Ex-Partner: No    Emotionally Abused: No    Physically Abused: No    Sexually Abused: No    Review of Systems: See HPI, otherwise negative ROS  Physical Exam: BP (!) 147/72   Temp 98.1 F (36.7 C) (Temporal)   Ht 5' 4.02" (1.626 m)   Wt 68.8 kg   SpO2 100%   BMI 26.01 kg/m  General:   Alert,  pleasant and cooperative in NAD Head:  Normocephalic and atraumatic. Lungs:  Clear to auscultation.    Heart:  Regular rate and rhythm.   Impression/Plan: Shannon Hickman is here for ophthalmic surgery.  Risks, benefits, limitations, and alternatives regarding ophthalmic surgery have been reviewed with the patient.  Questions have been answered.  All parties agreeable.   Lockie Mola, MD  11/18/2023, 8:27 AM

## 2023-11-18 NOTE — Anesthesia Postprocedure Evaluation (Signed)
Anesthesia Post Note  Patient: Shannon Hickman  Procedure(s) Performed: CATARACT EXTRACTION PHACO AND INTRAOCULAR LENS PLACEMENT (IOC) RIGHT 6.01 00:36.0 (Right)  Patient location during evaluation: PACU Anesthesia Type: MAC Level of consciousness: awake and alert Pain management: pain level controlled Vital Signs Assessment: post-procedure vital signs reviewed and stable Respiratory status: spontaneous breathing, nonlabored ventilation, respiratory function stable and patient connected to nasal cannula oxygen Cardiovascular status: stable and blood pressure returned to baseline Postop Assessment: no apparent nausea or vomiting Anesthetic complications: no   No notable events documented.   Last Vitals:  Vitals:   11/18/23 0935 11/18/23 0938  BP: 128/64 128/69  Pulse: 82 70  Resp: 15 11  Temp:  (!) 36.2 C  SpO2: 100% 100%    Last Pain:  Vitals:   11/18/23 0938  TempSrc:   PainSc: 0-No pain                 Yevette Edwards

## 2023-11-18 NOTE — Op Note (Signed)
LOCATION:  [facility]   PREOPERATIVE DIAGNOSIS:    Nuclear sclerotic cataract right eye. H25.11   POSTOPERATIVE DIAGNOSIS:  Nuclear sclerotic cataract right eye.     PROCEDURE:  Phacoemusification with posterior chamber intraocular lens placement of the right eye   ULTRASOUND TIME: Procedure(s): CATARACT EXTRACTION PHACO AND INTRAOCULAR LENS PLACEMENT (IOC) RIGHT 6.01 00:36.0 (Right)  LENS:   Implant Name Type Inv. Item Serial No. Manufacturer Lot No. LRB No. Used Action  LENS IOL TECNIS EYHANCE 19.5 - [id number] Intraocular Lens LENS IOL TECNIS EYHANCE 19.5 [id number] [employer]  Right 1 Implanted         SURGEON:  Deirdre Evener, MD   ANESTHESIA:  Topical with tetracaine drops and 2% Xylocaine jelly, augmented with 1% preservative-free intracameral lidocaine.    COMPLICATIONS:  None.   DESCRIPTION OF PROCEDURE:  The patient was identified in the holding room and transported to the operating room and placed in the supine position under the operating microscope.  The right eye was identified as the operative eye and it was prepped and draped in the usual sterile ophthalmic fashion.   A 1 millimeter clear-corneal paracentesis was made at the 12:00 position.  0.5 ml of preservative-free 1% lidocaine was injected into the anterior chamber. The anterior chamber was filled with Viscoat viscoelastic.  A 2.4 millimeter keratome was used to make a near-clear corneal incision at the 9:00 position.  A curvilinear capsulorrhexis was made with a cystotome and capsulorrhexis forceps.  Balanced salt solution was used to hydrodissect and hydrodelineate the nucleus.   Phacoemulsification was then used in stop and chop fashion to remove the lens nucleus and epinucleus.  The remaining cortex was then removed using the irrigation and aspiration handpiece. Provisc was then placed into the capsular bag to distend it for lens placement.  A lens was then injected into the capsular bag.  The  remaining viscoelastic was aspirated.   Wounds were hydrated with balanced salt solution.  The anterior chamber was inflated to a physiologic pressure with balanced salt solution.  No wound leaks were noted. Cefuroxime 0.1 ml of a 10mg /ml solution was injected into the anterior chamber for a dose of 1 mg of intracameral antibiotic at the completion of the case.   Timolol and Brimonidine drops were applied to the eye.  The patient was taken to the recovery room in stable condition without complications of anesthesia or surgery.   Jelitza Manninen 11/18/2023, 9:30 AM

## 2023-11-18 NOTE — Transfer of Care (Signed)
Immediate Anesthesia Transfer of Care Note  Patient: Shannon Hickman  Procedure(s) Performed: CATARACT EXTRACTION PHACO AND INTRAOCULAR LENS PLACEMENT (IOC) RIGHT 6.01 00:36.0 (Right)  Patient Location: PACU  Anesthesia Type: MAC  Level of Consciousness: awake, alert  and patient cooperative  Airway and Oxygen Therapy: Patient Spontanous Breathing and Patient connected to supplemental oxygen  Post-op Assessment: Post-op Vital signs reviewed, Patient's Cardiovascular Status Stable, Respiratory Function Stable, Patent Airway and No signs of Nausea or vomiting  Post-op Vital Signs: Reviewed and stable  Complications: No notable events documented.

## 2023-11-18 NOTE — Anesthesia Preprocedure Evaluation (Signed)
Anesthesia Evaluation  Patient identified by MRN, date of birth, ID band Patient awake    Reviewed: Allergy & Precautions, H&P , NPO status , Patient's Chart, lab work & pertinent test results, reviewed documented beta blocker date and time   Airway Mallampati: II  TM Distance: >3 FB Neck ROM: full    Dental no notable dental hx. (+) Teeth Intact   Pulmonary neg pulmonary ROS   Pulmonary exam normal breath sounds clear to auscultation       Cardiovascular Exercise Tolerance: Good hypertension, On Medications + Valvular Problems/Murmurs  Rhythm:regular Rate:Normal     Neuro/Psych  Headaches  negative psych ROS   GI/Hepatic negative GI ROS, Neg liver ROS,,,  Endo/Other  negative endocrine ROSdiabetes    Renal/GU      Musculoskeletal   Abdominal   Peds  Hematology negative hematology ROS (+)   Anesthesia Other Findings   Reproductive/Obstetrics negative OB ROS                             Anesthesia Physical Anesthesia Plan  ASA: 2  Anesthesia Plan: MAC   Post-op Pain Management:    Induction:   PONV Risk Score and Plan:   Airway Management Planned:   Additional Equipment:   Intra-op Plan:   Post-operative Plan:   Informed Consent: I have reviewed the patients History and Physical, chart, labs and discussed the procedure including the risks, benefits and alternatives for the proposed anesthesia with the patient or authorized representative who has indicated his/her understanding and acceptance.       Plan Discussed with: CRNA  Anesthesia Plan Comments:        Anesthesia Quick Evaluation

## 2023-11-19 DIAGNOSIS — H2512 Age-related nuclear cataract, left eye: Secondary | ICD-10-CM | POA: Diagnosis not present

## 2023-11-23 ENCOUNTER — Encounter: Payer: Self-pay | Admitting: Ophthalmology

## 2023-11-24 NOTE — Anesthesia Preprocedure Evaluation (Addendum)
Anesthesia Evaluation  Patient identified by MRN, date of birth, ID band Patient awake    Reviewed: Allergy & Precautions, H&P , NPO status , Patient's Chart, lab work & pertinent test results  Airway Mallampati: II  TM Distance: >3 FB Neck ROM: Full    Dental no notable dental hx.    Pulmonary neg pulmonary ROS   Pulmonary exam normal breath sounds clear to auscultation       Cardiovascular hypertension, Normal cardiovascular exam+ Valvular Problems/Murmurs  Rhythm:Regular Rate:Normal     Neuro/Psych  Headaches negative neurological ROS  negative psych ROS   GI/Hepatic negative GI ROS, Neg liver ROS,,,  Endo/Other  negative endocrine ROS    Renal/GU negative Renal ROS  negative genitourinary   Musculoskeletal negative musculoskeletal ROS (+) Arthritis ,    Abdominal   Peds negative pediatric ROS (+)  Hematology negative hematology ROS (+)   Anesthesia Other Findings Previous cataract surgery 11-18-23 Dr. Pernell Dupre anesthesiologist and Barbette Hair, versed 2 mg IV and fentanyl 50 mcg IV  Heart murmur  Migraine Urinary tract infection Inflammatory polyps of colon (HCC) Hypertension High cholesterol    Reproductive/Obstetrics negative OB ROS                              Anesthesia Physical Anesthesia Plan  ASA: 3  Anesthesia Plan: MAC   Post-op Pain Management:    Induction: Intravenous  PONV Risk Score and Plan:   Airway Management Planned: Natural Airway and Nasal Cannula  Additional Equipment:   Intra-op Plan:   Post-operative Plan:   Informed Consent: I have reviewed the patients History and Physical, chart, labs and discussed the procedure including the risks, benefits and alternatives for the proposed anesthesia with the patient or authorized representative who has indicated his/her understanding and acceptance.     Dental Advisory Given  Plan Discussed with:  Anesthesiologist, CRNA and Surgeon  Anesthesia Plan Comments: (Patient consented for risks of anesthesia including but not limited to:  - adverse reactions to medications - damage to eyes, teeth, lips or other oral mucosa - nerve damage due to positioning  - sore throat or hoarseness - Damage to heart, brain, nerves, lungs, other parts of body or loss of life  Patient voiced understanding and assent.)         Anesthesia Quick Evaluation

## 2023-11-30 NOTE — Discharge Instructions (Signed)

## 2023-12-02 ENCOUNTER — Ambulatory Visit: Payer: Medicare Other | Admitting: Anesthesiology

## 2023-12-02 ENCOUNTER — Encounter: Payer: Self-pay | Admitting: Ophthalmology

## 2023-12-02 ENCOUNTER — Ambulatory Visit
Admission: RE | Admit: 2023-12-02 | Discharge: 2023-12-02 | Disposition: A | Discharge status: Home / Self Care | Payer: Medicare Other | Attending: Ophthalmology | Admitting: Ophthalmology

## 2023-12-02 ENCOUNTER — Other Ambulatory Visit: Payer: Self-pay

## 2023-12-02 ENCOUNTER — Encounter
Admission: RE | Disposition: A | Discharge status: Home / Self Care | Payer: Self-pay | Source: Home / Self Care | Attending: Ophthalmology

## 2023-12-02 DIAGNOSIS — I351 Nonrheumatic aortic (valve) insufficiency: Secondary | ICD-10-CM | POA: Diagnosis not present

## 2023-12-02 DIAGNOSIS — H2512 Age-related nuclear cataract, left eye: Secondary | ICD-10-CM | POA: Diagnosis not present

## 2023-12-02 DIAGNOSIS — E7800 Pure hypercholesterolemia, unspecified: Secondary | ICD-10-CM | POA: Diagnosis not present

## 2023-12-02 DIAGNOSIS — I10 Essential (primary) hypertension: Secondary | ICD-10-CM | POA: Insufficient documentation

## 2023-12-02 HISTORY — PX: CATARACT EXTRACTION W/PHACO: SHX586

## 2023-12-02 SURGERY — PHACOEMULSIFICATION, CATARACT, WITH IOL INSERTION
Anesthesia: Monitor Anesthesia Care | Laterality: Left

## 2023-12-02 MED ORDER — SIGHTPATH DOSE#1 BSS IO SOLN
INTRAOCULAR | Status: DC | PRN
  Administered 2023-12-02: 45 mL via OPHTHALMIC

## 2023-12-02 MED ORDER — FENTANYL CITRATE (PF) 100 MCG/2ML IJ SOLN
INTRAMUSCULAR | Status: DC | PRN
  Administered 2023-12-02: 50 ug via INTRAVENOUS
  Administered 2023-12-02 (×2): 25 ug via INTRAVENOUS

## 2023-12-02 MED ORDER — MIDAZOLAM HCL 2 MG/2ML IJ SOLN
INTRAMUSCULAR | Status: DC | PRN
  Administered 2023-12-02: 2 mg via INTRAVENOUS

## 2023-12-02 MED ORDER — MIDAZOLAM HCL 2 MG/2ML IJ SOLN
INTRAMUSCULAR | Status: AC
  Filled 2023-12-02: qty 2

## 2023-12-02 MED ORDER — BRIMONIDINE TARTRATE-TIMOLOL 0.2-0.5 % OP SOLN
OPHTHALMIC | Status: DC | PRN
  Administered 2023-12-02: 1 [drp] via OPHTHALMIC

## 2023-12-02 MED ORDER — SIGHTPATH DOSE#1 BSS IO SOLN
INTRAOCULAR | Status: DC | PRN
  Administered 2023-12-02: 2 mL

## 2023-12-02 MED ORDER — TETRACAINE HCL 0.5 % OP SOLN
1.0000 [drp] | OPHTHALMIC | Status: DC | PRN
  Administered 2023-12-02 (×3): 1 [drp] via OPHTHALMIC

## 2023-12-02 MED ORDER — CEFUROXIME OPHTHALMIC INJECTION 1 MG/0.1 ML
INJECTION | OPHTHALMIC | Status: DC | PRN
  Administered 2023-12-02: 1 mg via INTRACAMERAL

## 2023-12-02 MED ORDER — ARMC OPHTHALMIC DILATING DROPS
1.0000 | OPHTHALMIC | Status: DC | PRN
  Administered 2023-12-02 (×3): 1 via OPHTHALMIC

## 2023-12-02 MED ORDER — SIGHTPATH DOSE#1 NA HYALUR & NA CHOND-NA HYALUR IO KIT
PACK | INTRAOCULAR | Status: DC | PRN
  Administered 2023-12-02: 1 via OPHTHALMIC

## 2023-12-02 MED ORDER — FENTANYL CITRATE (PF) 100 MCG/2ML IJ SOLN
INTRAMUSCULAR | Status: AC
  Filled 2023-12-02: qty 2

## 2023-12-02 MED ORDER — SIGHTPATH DOSE#1 BSS IO SOLN
INTRAOCULAR | Status: DC | PRN
  Administered 2023-12-02: 15 mL via INTRAOCULAR

## 2023-12-02 SURGICAL SUPPLY — 8 items
CATARACT SUITE SIGHTPATH (MISCELLANEOUS) ×1
FEE CATARACT SUITE SIGHTPATH (MISCELLANEOUS) ×1 IMPLANT
GLOVE SRG 8 PF TXTR STRL LF DI (GLOVE) ×1 IMPLANT
GLOVE SURG ENC TEXT LTX SZ7.5 (GLOVE) ×1 IMPLANT
LENS IOL TECNIS EYHANCE 20.5 (Intraocular Lens) IMPLANT
NDL FILTER BLUNT 18X1 1/2 (NEEDLE) ×1 IMPLANT
NEEDLE FILTER BLUNT 18X1 1/2 (NEEDLE) ×1
SYR 3ML LL SCALE MARK (SYRINGE) ×1 IMPLANT

## 2023-12-02 NOTE — Anesthesia Postprocedure Evaluation (Signed)
Anesthesia Post Note  Patient: Shannon Hickman  Procedure(s) Performed: CATARACT EXTRACTION PHACO AND INTRAOCULAR LENS PLACEMENT (IOC) LEFT 6.01 00:33.8 (Left)  Patient location during evaluation: PACU Anesthesia Type: MAC Level of consciousness: awake and alert Pain management: pain level controlled Vital Signs Assessment: post-procedure vital signs reviewed and stable Respiratory status: spontaneous breathing, nonlabored ventilation, respiratory function stable and patient connected to nasal cannula oxygen Cardiovascular status: stable and blood pressure returned to baseline Postop Assessment: no apparent nausea or vomiting Anesthetic complications: no   No notable events documented.   Last Vitals:  Vitals:   12/02/23 1206 12/02/23 1211  BP: 138/67 131/73  Pulse: 78 72  Resp: (!) 9 11  Temp: (!) 36.1 C (!) 36.1 C  SpO2: 100% 99%    Last Pain:  Vitals:   12/02/23 1211  PainSc: 0-No pain                 [place]ra Reliford C Orenthal Debski

## 2023-12-02 NOTE — Op Note (Signed)
OPERATIVE NOTE  AYDIN SEFTON [id number] 12/02/2023   PREOPERATIVE DIAGNOSIS:  Nuclear sclerotic cataract left eye. H25.12   POSTOPERATIVE DIAGNOSIS:    Nuclear sclerotic cataract left eye.     PROCEDURE:  Phacoemusification with posterior chamber intraocular lens placement of the left eye  Ultrasound time: Procedure(s): CATARACT EXTRACTION PHACO AND INTRAOCULAR LENS PLACEMENT (IOC) LEFT 6.01 00:33.8 (Left)  LENS:   Implant Name Type Inv. Item Serial No. Manufacturer Lot No. LRB No. Used Action  LENS IOL TECNIS EYHANCE 20.5 - [id number] Intraocular Lens LENS IOL TECNIS EYHANCE 20.5 [id number] [employer]  Left 1 Implanted      SURGEON:  Deirdre Evener, MD   ANESTHESIA:  Topical with tetracaine drops and 2% Xylocaine jelly, augmented with 1% preservative-free intracameral lidocaine.    COMPLICATIONS:  None.   DESCRIPTION OF PROCEDURE:  The patient was identified in the holding room and transported to the operating room and placed in the supine position under the operating microscope.  The left eye was identified as the operative eye and it was prepped and draped in the usual sterile ophthalmic fashion.   A 1 millimeter clear-corneal paracentesis was made at the 1:30 position.  0.5 ml of preservative-free 1% lidocaine was injected into the anterior chamber.  The anterior chamber was filled with Viscoat viscoelastic.  A 2.4 millimeter keratome was used to make a near-clear corneal incision at the 10:30 position.  .  A curvilinear capsulorrhexis was made with a cystotome and capsulorrhexis forceps.  Balanced salt solution was used to hydrodissect and hydrodelineate the nucleus.   Phacoemulsification was then used in stop and chop fashion to remove the lens nucleus and epinucleus.  The remaining cortex was then removed using the irrigation and aspiration handpiece. Provisc was then placed into the capsular bag to distend it for lens placement.  A lens was then injected into the  capsular bag.  The remaining viscoelastic was aspirated.   Wounds were hydrated with balanced salt solution.  The anterior chamber was inflated to a physiologic pressure with balanced salt solution.  No wound leaks were noted. Cefuroxime 0.1 ml of a 10mg /ml solution was injected into the anterior chamber for a dose of 1 mg of intracameral antibiotic at the completion of the case.   Timolol and Brimonidine drops were applied to the eye.  The patient was taken to the recovery room in stable condition without complications of anesthesia or surgery.  Diontay Rosencrans 12/02/2023, 12:04 PM

## 2023-12-02 NOTE — H&P (Signed)
[facility]   Primary Care Physician:  Sherlene Shams, MD Ophthalmologist: Dr. Lockie Mola  Pre-Procedure History & Physical: HPI:  Shannon Hickman is a 77 y.o. female here for ophthalmic surgery.   Past Medical History:  Diagnosis Date   Chicken pox    Diverticulitis    Heart murmur    High cholesterol    Hypertension    Inflammatory polyps of colon (HCC)    Migraine    Urinary tract infection     Past Surgical History:  Procedure Laterality Date   APPENDECTOMY  1998   BUNIONECTOMY Left 01/08/2017   Procedure: Arthrodesis Metatarsalphalangeal joint MTPJ Left;  Surgeon: Recardo Evangelist, DPM;  Location: [facility];  Service: Podiatry;  Laterality: Left;   BUNIONECTOMY Right 06/26/2021   Procedure: BUNIONECTOMY- LAPIDUS TYPE, PHALANX OSTEOTOMY- AKIN;  Surgeon: Gwyneth Revels, DPM;  Location: [facility];  Service: Podiatry;  Laterality: Right;   CATARACT EXTRACTION W/PHACO Right 11/18/2023   Procedure: CATARACT EXTRACTION PHACO AND INTRAOCULAR LENS PLACEMENT (IOC) RIGHT 6.01 00:36.0;  Surgeon: Lockie Mola, MD;  Location: [facility];  Service: Ophthalmology;  Laterality: Right;   HISTOPLASMOSIS     KNEE ARTHROSCOPY Left 08/02/2015   Procedure: ARTHROSCOPY KNEE, PARTIAL MEDIAL MENISCECTOMY;  Surgeon: Kennedy Bucker, MD;  Location: [facility];  Service: Orthopedics;  Laterality: Left;   PARTIAL COLECTOMY     diverticular rupture, Renda Rolls     Prior to Admission medications   Medication Sig Start Date End Date Taking? Authorizing Provider  acetaminophen (TYLENOL) 325 MG tablet Take 650 mg by mouth every 6 (six) hours as needed.   Yes [provider]  alendronate (FOSAMAX) 70 MG tablet Take 1 tablet (70 mg total) by mouth every 7 (seven) days. Take with a full glass of water on an empty stomach. 09/25/23  Yes Sherlene Shams, MD  amLODipine (NORVASC) 10 MG tablet Take 1 tablet (10 mg total) by mouth daily. 09/25/23  Yes Sherlene Shams, MD  atorvastatin (LIPITOR) 40 MG tablet Take 1 tablet (40 mg total) by mouth daily. 03/26/23  Yes Sherlene Shams, MD  CALCIUM PO Take by mouth daily.   Yes [provider]  cetirizine (ZYRTEC) 10 MG tablet Take 10 mg by mouth daily.    Yes [provider]  Coenzyme Q10 (COQ10) 100 MG CAPS Take 1 capsule by mouth daily.   Yes [provider]  Multiple Vitamins-Minerals (CENTRUM SILVER ULTRA WOMENS PO) Take 1 tablet by mouth daily.   Yes [provider]  vitamin B-12 (CYANOCOBALAMIN) 1000 MCG tablet Take 1,000 mcg by mouth daily.   Yes [provider]  metroNIDAZOLE (METROGEL) 0.75 % gel Apply 1 Application topically at bedtime.    [provider]  Misc Natural Products (OSTEO BI-FLEX ADV TRIPLE ST PO)  08/15/22   [provider]    Allergies as of 10/20/2023 - Review Complete 09/25/2023  Allergen Reaction Noted   Minocycline  09/20/2012    Family History  Problem Relation Age of Onset   Heart disease Father 62   Diabetes Father 31   Stomach cancer Maternal Grandfather    Cancer Neg Hx     Social History   Socioeconomic History   Marital status: Married    Spouse name: Not on file   Number of children: Not on file   Years of education: Not on file   Highest education level: Bachelor's degree (e.g., BA, AB, BS)  Occupational History   Not  on file  Tobacco Use   Smoking status: Never   Smokeless tobacco: Never  Vaping Use   Vaping status: Never Used  Substance and Sexual Activity   Alcohol use: No   Drug use: Not on file   Sexual activity: Yes  Other Topics Concern   Not on file  Social History Narrative   Married   Social Drivers of Health   Financial Resource Strain: Low Risk  (08/08/2023)   Overall Financial Resource Strain (CARDIA)    Difficulty of Paying Living Expenses: Not hard at all  Food Insecurity: No Food Insecurity (08/08/2023)   Hunger Vital Sign    Worried About Running Out of Food  in the Last Year: Never true    Ran Out of Food in the Last Year: Never true  Transportation Needs: No Transportation Needs (08/08/2023)   PRAPARE - Administrator, Civil Service (Medical): No    Lack of Transportation (Non-Medical): No  Physical Activity: Insufficiently Active (08/08/2023)   Exercise Vital Sign    Days of Exercise per Week: 1 day    Minutes of Exercise per Session: 30 min  Stress: No Stress Concern Present (08/08/2023)   [employer] of Occupational Health - Occupational Stress Questionnaire    Feeling of Stress : Only a little  Social Connections: Socially Integrated (08/08/2023)   Social Connection and Isolation Panel [NHANES]    Frequency of Communication with Friends and Family: Twice a week    Frequency of Social Gatherings with Friends and Family: Once a week    Attends Religious Services: More than 4 times per year    Active Member of [employer] or Organizations: Yes    Attends Engineer, structural: More than 4 times per year    Marital Status: Married  Catering manager Violence: Not At Risk (08/11/2023)   Humiliation, Afraid, Rape, and Kick questionnaire    Fear of Current or Ex-Partner: No    Emotionally Abused: No    Physically Abused: No    Sexually Abused: No    Review of Systems: See HPI, otherwise negative ROS  Physical Exam: BP (!) 149/64   Pulse 77   Temp 97.7 F (36.5 C)   Resp 13   Ht 5' 4.02" (1.626 m)   Wt 69.3 kg   SpO2 99%   BMI 26.21 kg/m  General:   Alert,  pleasant and cooperative in NAD Head:  Normocephalic and atraumatic. Lungs:  Clear to auscultation.    Heart:  Regular rate and rhythm.   Impression/Plan: Shannon Hickman is here for ophthalmic surgery.  Risks, benefits, limitations, and alternatives regarding ophthalmic surgery have been reviewed with the patient.  Questions have been answered.  All parties agreeable.   Lockie Mola, MD  12/02/2023, 11:09 AM

## 2023-12-02 NOTE — Transfer of Care (Signed)
Immediate Anesthesia Transfer of Care Note  Patient: Shannon Hickman  Procedure(s) Performed: CATARACT EXTRACTION PHACO AND INTRAOCULAR LENS PLACEMENT (IOC) LEFT 6.01 00:33.8 (Left)  Patient Location: PACU  Anesthesia Type: MAC  Level of Consciousness: awake, alert  and patient cooperative  Airway and Oxygen Therapy: Patient Spontanous Breathing and Patient connected to supplemental oxygen  Post-op Assessment: Post-op Vital signs reviewed, Patient's Cardiovascular Status Stable, Respiratory Function Stable, Patent Airway and No signs of Nausea or vomiting  Post-op Vital Signs: Reviewed and stable  Complications: No notable events documented.

## 2023-12-03 ENCOUNTER — Encounter: Payer: Self-pay | Admitting: Ophthalmology

## 2023-12-17 DIAGNOSIS — Z961 Presence of intraocular lens: Secondary | ICD-10-CM | POA: Diagnosis not present

## 2023-12-17 DIAGNOSIS — H43813 Vitreous degeneration, bilateral: Secondary | ICD-10-CM | POA: Diagnosis not present

## 2024-01-13 ENCOUNTER — Encounter: Payer: Self-pay | Admitting: Internal Medicine

## 2024-01-22 DIAGNOSIS — H43811 Vitreous degeneration, right eye: Secondary | ICD-10-CM | POA: Diagnosis not present

## 2024-01-22 DIAGNOSIS — Z961 Presence of intraocular lens: Secondary | ICD-10-CM | POA: Diagnosis not present

## 2024-03-25 ENCOUNTER — Ambulatory Visit: Payer: BLUE CROSS/BLUE SHIELD | Admitting: Internal Medicine

## 2024-03-25 VITALS — BP 134/64 | HR 88 | Ht 64.0 in | Wt 151.4 lb

## 2024-03-25 DIAGNOSIS — E782 Mixed hyperlipidemia: Secondary | ICD-10-CM

## 2024-03-25 DIAGNOSIS — R7989 Other specified abnormal findings of blood chemistry: Secondary | ICD-10-CM | POA: Diagnosis not present

## 2024-03-25 DIAGNOSIS — Z9842 Cataract extraction status, left eye: Secondary | ICD-10-CM

## 2024-03-25 DIAGNOSIS — Z9841 Cataract extraction status, right eye: Secondary | ICD-10-CM | POA: Insufficient documentation

## 2024-03-25 DIAGNOSIS — I10 Essential (primary) hypertension: Secondary | ICD-10-CM

## 2024-03-25 LAB — COMPREHENSIVE METABOLIC PANEL WITH GFR
ALT: 21 U/L (ref 0–35)
AST: 22 U/L (ref 0–37)
Albumin: 4.9 g/dL (ref 3.5–5.2)
Alkaline Phosphatase: 35 U/L — ABNORMAL LOW (ref 39–117)
BUN: 18 mg/dL (ref 6–23)
CO2: 31 meq/L (ref 19–32)
Calcium: 9.6 mg/dL (ref 8.4–10.5)
Chloride: 103 meq/L (ref 96–112)
Creatinine, Ser: 0.65 mg/dL (ref 0.40–1.20)
GFR: 85.43 mL/min (ref >60.00)
Glucose, Bld: 89 mg/dL (ref 70–99)
Potassium: 4.5 meq/L (ref 3.5–5.1)
Sodium: 140 meq/L (ref 135–145)
Total Bilirubin: 0.5 mg/dL (ref 0.2–1.2)
Total Protein: 7.1 g/dL (ref 6.0–8.3)

## 2024-03-25 LAB — LIPID PANEL
Cholesterol: 157 mg/dL (ref 0–200)
HDL: 70 mg/dL (ref >39.00)
LDL Cholesterol: 71 mg/dL (ref 0–99)
NonHDL: 87.36
Total CHOL/HDL Ratio: 2
Triglycerides: 84 mg/dL (ref 0.0–149.0)
VLDL: 16.8 mg/dL (ref 0.0–40.0)

## 2024-03-25 LAB — TSH: TSH: 0.42 u[IU]/mL (ref 0.35–5.50)

## 2024-03-25 LAB — LDL CHOLESTEROL, DIRECT: Direct LDL: 68 mg/dL

## 2024-03-25 MED ORDER — ATORVASTATIN CALCIUM 40 MG PO TABS: 40.0000 mg | ORAL_TABLET | Freq: Every day | ORAL | 3 refills | Status: AC

## 2024-03-25 MED ORDER — AMLODIPINE BESYLATE 10 MG PO TABS: 10.0000 mg | ORAL_TABLET | Freq: Every day | ORAL | 1 refills | Status: DC

## 2024-03-25 NOTE — Patient Instructions (Addendum)
 YOUR BLOOD PRESSURE IS elevated  It should be around 130/80 or less.  Please check your blood pressure a few times at home and send me the readings so I can determine if you need to increase your MEDICATION.  You can add flonase (steroid nasal spray) or astepro  (antihistamine nasal spray ) to your twice daily regimen of zyrtec , if needed ,  to control  your congestion /allergies during pollen season,.  Also very important to flush your sinuses after outside activities with salt water (Neilmed rinse,  Ocean or [place]).      Turmeric is safe to use for management of arthritis pain   You might want to try using Relaxium for insomnia  (as seen on TV commercials) . It is available through [employer] and contains all natural supplements:  Melatonin 5 mg  Chamomile 25 mg Passionflower extract 75 mg GABA 100 mg Ashwaganda extract 125 mg Magnesium citrate, glycinate, oxide (100 mg)  L tryptophan 500 mg Valerest (proprietary  ingredient ; probably valeria root extract)

## 2024-03-25 NOTE — Progress Notes (Signed)
 Subjective:  Patient ID: Shannon Hickman, female    DOB: [birth year]  Age: 78 y.o. MRN: [id number]  CC: The primary encounter diagnosis was Essential hypertension. Diagnoses of Mixed hyperlipidemia, Abnormal TSH, and S/P bilateral cataract extraction were also pertinent to this visit.   HPI Shannon Hickman presents for  Chief Complaint  Patient presents with   Medical Management of Chronic Issues    6 month follow up    1)HTN;  Patient is taking her medications as prescribed and notes no adverse effects.  Home BP readings have been done about once per week and are  generally < 130/80 .  She is avoiding added salt in her diet and walking regularly about 3 times per week for exercise  .   2) HLD:  taking atorvastatin without side effects. Exercising regularly, following a careful diet.      Outpatient Medications Prior to Visit  Medication Sig Dispense Refill   acetaminophen (TYLENOL) 325 MG tablet Take 650 mg by mouth every 6 (six) hours as needed.     alendronate (FOSAMAX) 70 MG tablet Take 1 tablet (70 mg total) by mouth every 7 (seven) days. Take with a full glass of water on an empty stomach. 4 tablet 11   CALCIUM PO Take by mouth daily.     cetirizine (ZYRTEC) 10 MG tablet Take 10 mg by mouth daily.      Coenzyme Q10 (COQ10) 100 MG CAPS Take 1 capsule by mouth daily.     metroNIDAZOLE (METROGEL) 0.75 % gel Apply 1 Application topically at bedtime.     Misc Natural Products (OSTEO BI-FLEX ADV TRIPLE ST PO)      Multiple Vitamins-Minerals (CENTRUM SILVER ULTRA WOMENS PO) Take 1 tablet by mouth daily.     vitamin B-12 (CYANOCOBALAMIN) 1000 MCG tablet Take 1,000 mcg by mouth daily.     amLODipine (NORVASC) 10 MG tablet Take 1 tablet (10 mg total) by mouth daily. 90 tablet 1   atorvastatin (LIPITOR) 40 MG tablet Take 1 tablet (40 mg total) by mouth daily. 90 tablet 3   No facility-administered medications prior to visit.    Review of Systems;  Patient denies headache, fevers,  malaise, unintentional weight loss, skin rash, eye pain, sinus congestion and sinus pain, sore throat, dysphagia,  hemoptysis , cough, dyspnea, wheezing, chest pain, palpitations, orthopnea, edema, abdominal pain, nausea, melena, diarrhea, constipation, flank pain, dysuria, hematuria, urinary  Frequency, nocturia, numbness, tingling, seizures,  Focal weakness, Loss of consciousness,  Tremor, insomnia, depression, anxiety, and suicidal ideation.      Objective:  BP 134/64   Pulse 88   Ht 5\' 4"  (1.626 m)   Wt 151 lb 6.4 oz (68.7 kg)   SpO2 97%   BMI 25.99 kg/m   BP Readings from Last 3 Encounters:  03/25/24 134/64  12/02/23 131/73  11/18/23 128/69    Wt Readings from Last 3 Encounters:  03/25/24 151 lb 6.4 oz (68.7 kg)  12/02/23 152 lb 12.8 oz (69.3 kg)  11/18/23 151 lb 9.6 oz (68.8 kg)    Physical Exam Vitals reviewed.  Constitutional:      General: She is not in acute distress.    Appearance: Normal appearance. She is normal weight. She is not ill-appearing, toxic-appearing or diaphoretic.  HENT:     Head: Normocephalic.  Eyes:     General: No scleral icterus.       Right eye: No discharge.        Left eye: No discharge.  Conjunctiva/sclera: Conjunctivae normal.  Cardiovascular:     Rate and Rhythm: Normal rate and regular rhythm.     Heart sounds: Normal heart sounds.  Pulmonary:     Effort: Pulmonary effort is normal. No respiratory distress.     Breath sounds: Normal breath sounds.  Musculoskeletal:        General: Normal range of motion.  Skin:    General: Skin is warm and dry.  Neurological:     General: No focal deficit present.     Mental Status: She is alert and oriented to person, place, and time. Mental status is at baseline.  Psychiatric:        Mood and Affect: Mood normal.        Behavior: Behavior normal.        Thought Content: Thought content normal.        Judgment: Judgment normal.    Lab Results  Component Value Date   HGBA1C 5.5  09/25/2023   HGBA1C 5.7 09/24/2022   HGBA1C 5.3 09/16/2021    Lab Results  Component Value Date   CREATININE 0.65 03/25/2024   CREATININE 0.66 09/25/2023   CREATININE 0.69 03/26/2023    Lab Results  Component Value Date   WBC 4.2 09/25/2023   HGB 14.3 09/25/2023   HCT 43.8 09/25/2023   PLT 200.0 09/25/2023   GLUCOSE 89 03/25/2024   CHOL 157 03/25/2024   TRIG 84.0 03/25/2024   HDL 70.00 03/25/2024   LDLDIRECT 68.0 03/25/2024   LDLCALC 71 03/25/2024   ALT 21 03/25/2024   AST 22 03/25/2024   NA 140 03/25/2024   K 4.5 03/25/2024   CL 103 03/25/2024   CREATININE 0.65 03/25/2024   BUN 18 03/25/2024   CO2 31 03/25/2024   TSH 0.42 03/25/2024   HGBA1C 5.5 09/25/2023   MICROALBUR <0.7 09/25/2023    No results found.  Assessment & Plan:  .Essential hypertension Assessment & Plan: Well controlled on current regimen of amlodipine 10 mg daily . Renal function stable, no changes today.   Orders: -     Lipid panel -     Comprehensive metabolic panel with GFR -     Comprehensive metabolic panel with GFR; Future  Mixed hyperlipidemia Assessment & Plan: Controlled on  80 mg atorvastatin daily.  LFTs normal.   Lab Results  Component Value Date   CHOL 157 03/25/2024   HDL 70.00 03/25/2024   LDLCALC 71 03/25/2024   LDLDIRECT 68.0 03/25/2024   TRIG 84.0 03/25/2024   CHOLHDL 2 03/25/2024   Lab Results  Component Value Date   ALT 21 03/25/2024   AST 22 03/25/2024   ALKPHOS 35 (L) 03/25/2024   BILITOT 0.5 03/25/2024     Orders: -     TSH -     Lipid panel -     LDL cholesterol, direct -     Lipid panel; Future  Abnormal TSH -     Lipid panel  S/P bilateral cataract extraction -     Lipid panel  Other orders -     amLODIPine Besylate; Take 1 tablet (10 mg total) by mouth daily.  Dispense: 90 tablet; Refill: 1 -     Atorvastatin Calcium; Take 1 tablet (40 mg total) by mouth daily.  Dispense: 90 tablet; Refill: 3     Follow-up: Return in about 6 months  (around 09/24/2024) for physical.   Thersia Flax, MD

## 2024-03-27 ENCOUNTER — Encounter: Payer: Self-pay | Admitting: Internal Medicine

## 2024-03-27 NOTE — Assessment & Plan Note (Signed)
 Controlled on  80 mg atorvastatin daily.  LFTs normal.   Lab Results  Component Value Date   CHOL 157 03/25/2024   HDL 70.00 03/25/2024   LDLCALC 71 03/25/2024   LDLDIRECT 68.0 03/25/2024   TRIG 84.0 03/25/2024   CHOLHDL 2 03/25/2024   Lab Results  Component Value Date   ALT 21 03/25/2024   AST 22 03/25/2024   ALKPHOS 35 (L) 03/25/2024   BILITOT 0.5 03/25/2024

## 2024-03-27 NOTE — Assessment & Plan Note (Signed)
Well controlled on current regimen of amlodipine 10 mg daily . Renal function stable, no changes today.

## 2024-06-02 DIAGNOSIS — M170 Bilateral primary osteoarthritis of knee: Secondary | ICD-10-CM | POA: Diagnosis not present

## 2024-06-13 DIAGNOSIS — I351 Nonrheumatic aortic (valve) insufficiency: Secondary | ICD-10-CM | POA: Diagnosis not present

## 2024-06-13 DIAGNOSIS — I10 Essential (primary) hypertension: Secondary | ICD-10-CM | POA: Diagnosis not present

## 2024-06-13 DIAGNOSIS — E7800 Pure hypercholesterolemia, unspecified: Secondary | ICD-10-CM | POA: Diagnosis not present

## 2024-07-11 DIAGNOSIS — M170 Bilateral primary osteoarthritis of knee: Secondary | ICD-10-CM | POA: Diagnosis not present

## 2024-07-19 DIAGNOSIS — M170 Bilateral primary osteoarthritis of knee: Secondary | ICD-10-CM | POA: Diagnosis not present

## 2024-07-26 DIAGNOSIS — M170 Bilateral primary osteoarthritis of knee: Secondary | ICD-10-CM | POA: Diagnosis not present

## 2024-08-10 ENCOUNTER — Encounter: Payer: Self-pay | Admitting: *Deleted

## 2024-08-13 ENCOUNTER — Other Ambulatory Visit: Payer: Self-pay | Admitting: Internal Medicine

## 2024-08-16 ENCOUNTER — Telehealth: Payer: Self-pay

## 2024-08-16 NOTE — Telephone Encounter (Signed)
No I did not call pt.

## 2024-08-16 NOTE — Telephone Encounter (Signed)
 Copied from CRM #[id number]. Topic: General - Call Back - No Documentation >> Aug 16, 2024 11:46 AM Mesmerise C wrote: Reason for CRM: Patient returning call with no VM checked chart advised appointment today for Medicare AWV  was cancelled and rescheduled for 10/8 may have been a reminder call patient stated she was aware was rescheduled  I do not see a call in patient's chart either.  Harlene, did you call patient?

## 2024-08-29 DIAGNOSIS — R300 Dysuria: Secondary | ICD-10-CM | POA: Diagnosis not present

## 2024-08-29 DIAGNOSIS — N390 Urinary tract infection, site not specified: Secondary | ICD-10-CM | POA: Diagnosis not present

## 2024-09-01 ENCOUNTER — Encounter: Payer: Self-pay | Admitting: Internal Medicine

## 2024-09-01 DIAGNOSIS — Z1231 Encounter for screening mammogram for malignant neoplasm of breast: Secondary | ICD-10-CM | POA: Diagnosis not present

## 2024-09-01 LAB — HM MAMMOGRAPHY

## 2024-09-07 DIAGNOSIS — M170 Bilateral primary osteoarthritis of knee: Secondary | ICD-10-CM | POA: Diagnosis not present

## 2024-09-16 DIAGNOSIS — R300 Dysuria: Secondary | ICD-10-CM | POA: Diagnosis not present

## 2024-09-16 DIAGNOSIS — N390 Urinary tract infection, site not specified: Secondary | ICD-10-CM | POA: Diagnosis not present

## 2024-09-17 DIAGNOSIS — Z23 Encounter for immunization: Secondary | ICD-10-CM | POA: Diagnosis not present

## 2024-09-21 ENCOUNTER — Ambulatory Visit (INDEPENDENT_AMBULATORY_CARE_PROVIDER_SITE_OTHER): Admitting: *Deleted

## 2024-09-21 VITALS — Ht 64.0 in | Wt 145.0 lb

## 2024-09-21 DIAGNOSIS — Z0000 Encounter for general adult medical examination without abnormal findings: Secondary | ICD-10-CM | POA: Diagnosis not present

## 2024-09-21 NOTE — Patient Instructions (Signed)
 Ms. Cathell,  Thank you for taking the time for your Medicare Wellness Visit. I appreciate your continued commitment to your health goals. Please review the care plan we discussed, and feel free to reach out if I can assist you further.  Medicare recommends these wellness visits once per year to help you and your care team stay ahead of potential health issues. These visits are designed to focus on prevention, allowing your provider to concentrate on managing your acute and chronic conditions during your regular appointments.  Please note that Annual Wellness Visits do not include a physical exam. Some assessments may be limited, especially if the visit was conducted virtually. If needed, we may recommend a separate in-person follow-up with your provider.  Ongoing Care Seeing your primary care provider every 3 to 6 months helps us  monitor your health and provide consistent, personalized care.  Consider updating your covid vaccine.  Referrals If a referral was made during today's visit and you haven't received any updates within two weeks, please contact the referred provider directly to check on the status.  Recommended Screenings:  Health Maintenance  Topic Date Due   COVID-19 Vaccine (9 - 2025-26 season) 08/15/2024   Breast Cancer Screening  09/01/2025   Medicare Annual Wellness Visit  09/21/2025   DTaP/Tdap/Td vaccine (3 - Td or Tdap) 03/25/2033   Pneumococcal Vaccine for age over 26  Completed   Flu Shot  Completed   DEXA scan (bone density measurement)  Completed   Hepatitis C Screening  Completed   Zoster (Shingles) Vaccine  Completed   Meningitis B Vaccine  Aged Out   Colon Cancer Screening  Discontinued       09/21/2024   11:43 AM  Advanced Directives  Does Patient Have a Medical Advance Directive? Yes  Type of Estate agent of [place];Living will  Copy of Healthcare Power of Attorney in Chart? No - copy requested   Advance Care Planning is  important because it: Ensures you receive medical care that aligns with your values, goals, and preferences. Provides guidance to your family and loved ones, reducing the emotional burden of decision-making during critical moments.  Vision: Annual vision screenings are recommended for early detection of glaucoma, cataracts, and diabetic retinopathy. These exams can also reveal signs of chronic conditions such as diabetes and high blood pressure.  Dental: Annual dental screenings help detect early signs of oral cancer, gum disease, and other conditions linked to overall health, including heart disease and diabetes.  Please see the attached documents for additional preventive care recommendations.

## 2024-09-21 NOTE — Progress Notes (Signed)
 Subjective:   Shannon Hickman is a 78 y.o. who presents for a Medicare Wellness preventive visit.  As a reminder, Annual Wellness Visits don't include a physical exam, and some assessments may be limited, especially if this visit is performed virtually. We may recommend an in-person follow-up visit with your provider if needed.  Visit Complete: Virtual I connected with  Shannon Hickman on 09/21/24 by a audio enabled telemedicine application and verified that I am speaking with the correct person using two identifiers.  Patient Location: Home  Provider Location: Home Office  I discussed the limitations of evaluation and management by telemedicine. The patient expressed understanding and agreed to proceed.  Vital Signs: Because this visit was a virtual/telehealth visit, some criteria may be missing or patient reported. Any vitals not documented were not able to be obtained and vitals that have been documented are patient reported.  VideoDeclined- This patient declined Librarian, academic. Therefore the visit was completed with audio only.  Persons Participating in Visit: Patient.  AWV Questionnaire: No: Patient Medicare AWV questionnaire was not completed prior to this visit.  Cardiac Risk Factors include: advanced age (>90men, >95 women);dyslipidemia;hypertension     Objective:    Today's Vitals   09/21/24 1133  Weight: 145 lb (65.8 kg)  Height: 5' 4 (1.626 m)   Body mass index is 24.89 kg/m.     09/21/2024   11:43 AM 12/02/2023   10:56 AM 11/18/2023    8:11 AM 08/11/2023    9:00 AM 08/06/2022   11:20 AM 08/05/2021    9:04 AM 06/26/2021   10:41 AM  Advanced Directives  Does Patient Have a Medical Advance Directive? Yes Yes Yes Yes Yes Yes Yes  Type of Estate agent of [place];Living will Living will Healthcare Power of [place];Living will Healthcare Power of [place];Living will Healthcare Power of [place];Living will  Healthcare Power of [place];Living will Healthcare Power of [place];Living will  Does patient want to make changes to medical advance directive?  No - Patient declined   No - Patient declined No - Patient declined No - Patient declined  Copy of Healthcare Power of Attorney in Chart? No - copy requested  No - copy requested No - copy requested No - copy requested No - copy requested Yes - validated most recent copy scanned in chart (See row information)    Current Medications (verified) Outpatient Encounter Medications as of 09/21/2024  Medication Sig   acetaminophen  (TYLENOL ) 325 MG tablet Take 650 mg by mouth every 6 (six) hours as needed.   alendronate  (FOSAMAX ) 70 MG tablet TAKE 1 TABLET BY MOUTH EVERY 7 DAYS. TAKE WITH A FULL GLASS OF WATER ON AN EMPTY STOMACH   amLODipine  (NORVASC ) 10 MG tablet Take 1 tablet (10 mg total) by mouth daily.   atorvastatin  (LIPITOR) 40 MG tablet Take 1 tablet (40 mg total) by mouth daily.   CALCIUM  PO Take by mouth daily.   cetirizine (ZYRTEC) 10 MG tablet Take 10 mg by mouth daily.    Coenzyme Q10 (COQ10) 100 MG CAPS Take 1 capsule by mouth daily.   metroNIDAZOLE  (METROGEL ) 0.75 % gel Apply 1 Application topically at bedtime.   Misc Natural Products (OSTEO BI-FLEX ADV TRIPLE ST PO)    Multiple Vitamins-Minerals (CENTRUM SILVER ULTRA WOMENS PO) Take 1 tablet by mouth daily.   vitamin B-12 (CYANOCOBALAMIN ) 1000 MCG tablet Take 1,000 mcg by mouth daily.   No facility-administered encounter medications on file as of 09/21/2024.  Allergies (verified) Minocycline   History: Past Medical History:  Diagnosis Date   Chicken pox    Diverticulitis    Heart murmur    High cholesterol    Hypertension    Inflammatory polyps of colon (HCC)    Migraine    Urinary tract infection    Past Surgical History:  Procedure Laterality Date   APPENDECTOMY  1998   BUNIONECTOMY Left 01/08/2017   Procedure: Arthrodesis Metatarsalphalangeal joint MTPJ Left;  Surgeon:  Donnice Cory, DPM;  Location: [facility];  Service: Podiatry;  Laterality: Left;   BUNIONECTOMY Right 06/26/2021   Procedure: BUNIONECTOMY- LAPIDUS TYPE, PHALANX OSTEOTOMY- AKIN;  Surgeon: Ashley Soulier, DPM;  Location: [facility];  Service: Podiatry;  Laterality: Right;   CATARACT EXTRACTION W/PHACO Right 11/18/2023   Procedure: CATARACT EXTRACTION PHACO AND INTRAOCULAR LENS PLACEMENT (IOC) RIGHT 6.01 00:36.0;  Surgeon: Mittie Gaskin, MD;  Location: [facility];  Service: Ophthalmology;  Laterality: Right;   CATARACT EXTRACTION W/PHACO Left 12/02/2023   Procedure: CATARACT EXTRACTION PHACO AND INTRAOCULAR LENS PLACEMENT (IOC) LEFT 6.01 00:33.8;  Surgeon: Mittie Gaskin, MD;  Location: [facility];  Service: Ophthalmology;  Laterality: Left;   HISTOPLASMOSIS     KNEE ARTHROSCOPY Left 08/02/2015   Procedure: ARTHROSCOPY KNEE, PARTIAL MEDIAL MENISCECTOMY;  Surgeon: Ozell Flake, MD;  Location: [facility];  Service: Orthopedics;  Laterality: Left;   PARTIAL COLECTOMY     diverticular rupture, Unknown [place]    Family History  Problem Relation Age of Onset   Heart disease Father 48   Diabetes Father 83   Stomach cancer Maternal Grandfather    Cancer Neg Hx    Social History   Socioeconomic History   Marital status: Married    Spouse name: Not on file   Number of children: Not on file   Years of education: Not on file   Highest education level: Bachelor's degree (e.g., BA, AB, BS)  Occupational History   Not on file  Tobacco Use   Smoking status: Never   Smokeless tobacco: Never  Vaping Use   Vaping status: Never Used  Substance and Sexual Activity   Alcohol use: No   Drug use: Not on file   Sexual activity: Yes  Other Topics Concern   Not on file  Social History Narrative   Married   Social Drivers of Health   Financial Resource Strain: Low Risk  (09/21/2024)   Overall Financial Resource Strain (CARDIA)    Difficulty of Paying  Living Expenses: Not hard at all  Food Insecurity: No Food Insecurity (09/21/2024)   Hunger Vital Sign    Worried About Running Out of Food in the Last Year: Never true    Ran Out of Food in the Last Year: Never true  Transportation Needs: No Transportation Needs (09/21/2024)   PRAPARE - Administrator, Civil Service (Medical): No    Lack of Transportation (Non-Medical): No  Physical Activity: Inactive (09/21/2024)   Exercise Vital Sign    Days of Exercise per Week: 1 day    Minutes of Exercise per Session: 0 min  Stress: No Stress Concern Present (09/21/2024)   [employer] of Occupational Health - Occupational Stress Questionnaire    Feeling of Stress: Not at all  Social Connections: Socially Integrated (09/21/2024)   Social Connection and Isolation Panel    Frequency of Communication with Friends and Family: More than three times a week    Frequency of Social Gatherings with Friends and Family: Once a  week    Attends Religious Services: More than 4 times per year    Active Member of Clubs or Organizations: Yes    Attends Banker Meetings: More than 4 times per year    Marital Status: Married    Tobacco Counseling Counseling given: Not Answered    Clinical Intake:  Pre-visit preparation completed: Yes  Pain : No/denies pain     BMI - recorded: 24.89 Nutritional Status: BMI of 19-24  Normal Nutritional Risks: None Diabetes: No  Lab Results  Component Value Date   HGBA1C 5.5 09/25/2023   HGBA1C 5.7 09/24/2022   HGBA1C 5.3 09/16/2021     How often do you need to have someone help you when you read instructions, pamphlets, or other written materials from your doctor or pharmacy?: 1 - Never  Interpreter Needed?: No  Information entered by :: R. Mikya Don LPN   Activities of Daily Living     09/21/2024   11:35 AM 08/11/2024    9:29 AM  In your present state of health, do you have any difficulty performing the following activities:  Hearing?  0 1  Vision? 0 0  Difficulty concentrating or making decisions? 0 0  Walking or climbing stairs? 0 0  Dressing or bathing? 0 0  Doing errands, shopping? 0 0  Preparing Food and eating ? N N  Using the Toilet? N   In the past six months, have you accidently leaked urine? Y   Do you have problems with loss of bowel control? N   Managing your Medications? N N  Managing your Finances? N N  Housekeeping or managing your Housekeeping? N N    Patient Care Team: Marylynn Verneita CROME, MD as PCP - General (Internal Medicine) Ammon Blunt, MD as Consulting Physician (Cardiology) Melodi Lerner, MD as Consulting Physician (Orthopedic Surgery)  I have updated your Care Teams any recent Medical Services you may have received from other providers in the past year.     Assessment:   This is a routine wellness examination for Shannon Hickman.  Hearing/Vision screen Hearing Screening - Comments:: No issues Vision Screening - Comments:: glasses   Goals Addressed             This Visit's Progress    Patient Stated       Wants to lose some weight       Depression Screen     09/21/2024   11:38 AM 03/25/2024   10:17 AM 09/25/2023    9:45 AM 08/11/2023    8:56 AM 03/26/2023    8:36 AM 09/24/2022    9:13 AM 08/06/2022   11:21 AM  PHQ 2/9 Scores  PHQ - 2 Score 0 1 0 0 0 0 0  PHQ- 9 Score 1  2 0       Fall Risk     09/21/2024   11:36 AM 08/11/2024    9:29 AM 03/25/2024   10:17 AM 09/25/2023    9:45 AM 08/08/2023    7:04 PM  Fall Risk   Falls in the past year? 0 0 0 0 0  Number falls in past yr: 0 0 0 0 0  Injury with Fall? 0 0  0 0  Risk for fall due to : No Fall Risks  No Fall Risks No Fall Risks   Follow up Falls evaluation completed;Falls prevention discussed  Falls evaluation completed Falls evaluation completed Falls evaluation completed;Falls prevention discussed    MEDICARE RISK AT HOME:  Medicare Risk at  Home Any stairs in or around the home?: No If so, are there any without  handrails?: No Home free of loose throw rugs in walkways, pet beds, electrical cords, etc?: Yes Adequate lighting in your home to reduce risk of falls?: Yes Life alert?: No Use of a cane, walker or w/c?: No Grab bars in the bathroom?: Yes Shower chair or bench in shower?: Yes Elevated toilet seat or a handicapped toilet?: Yes  TIMED UP AND GO:  Was the test performed?  No  Cognitive Function: 6CIT completed        09/21/2024   11:43 AM 08/11/2023    9:00 AM 08/06/2022   11:26 AM 08/02/2019    8:54 AM  6CIT Screen  What Year? 0 points 0 points  0 points  What month? 0 points 0 points  0 points  What time? 0 points 0 points  0 points  Count back from 20 0 points 0 points  0 points  Months in reverse 0 points 0 points 0 points 0 points  Repeat phrase 0 points 2 points  0 points  Total Score 0 points 2 points  0 points    Immunizations Immunization History  Administered Date(s) Administered   INFLUENZA, HIGH DOSE SEASONAL PF 09/01/2017, 08/18/2018, 07/28/2019, 09/07/2020   Influenza Split 09/15/2012, 09/28/2014, 08/26/2016   Influenza-Unspecified 08/15/2013, 09/18/2014, 08/28/2015, 08/27/2016, 09/01/2017, 09/17/2021, 09/18/2022, 09/19/2023   PFIZER Comirnaty(Gray Top)Covid-19 Tri-Sucrose Vaccine 01/07/2020, 01/28/2020   PFIZER(Purple Top)SARS-COV-2 Vaccination 01/07/2020, 01/28/2020, 09/07/2020, 04/03/2021   Pfizer Covid-19 Vaccine Bivalent Booster 56yrs & up 09/17/2021, 09/18/2022   Pneumococcal Conjugate-13 08/25/2014   Pneumococcal Polysaccharide-23 03/16/2013, 07/28/2019   Respiratory Syncytial Virus Vaccine,Recomb Aduvanted(Arexvy) 10/02/2022   Tdap 01/16/2011, 03/26/2023   Zoster Recombinant(Shingrix) 08/26/2022, 10/28/2022   Zoster, Live 01/16/2011    Screening Tests Health Maintenance  Topic Date Due   Influenza Vaccine  07/15/2024   Medicare Annual Wellness (AWV)  08/10/2024   COVID-19 Vaccine (9 - 2025-26 season) 08/15/2024   Mammogram  09/01/2025    DTaP/Tdap/Td (3 - Td or Tdap) 03/25/2033   Pneumococcal Vaccine: 50+ Years  Completed   DEXA SCAN  Completed   Hepatitis C Screening  Completed   Zoster Vaccines- Shingrix  Completed   Meningococcal B Vaccine  Aged Out   Colonoscopy  Discontinued    Health Maintenance Items Addressed: Patient is uncertain about taking the covid vaccine and will discuss with PCP at next office visit  Additional Screening:  Vision Screening: Recommended annual ophthalmology exams for early detection of glaucoma and other disorders of the eye. Is the patient up to date with their annual eye exam?  Yes  Who is the provider or what is the name of the office in which the patient attends annual eye exams?  [facility]  Dental Screening: Recommended annual dental exams for proper oral hygiene  Community Resource Referral / Chronic Care Management: CRR required this visit?  No   CCM required this visit?  No   Plan:    I have personally reviewed and noted the following in the patient's chart:   Medical and social history Use of alcohol, tobacco or illicit drugs  Current medications and supplements including opioid prescriptions. Patient is not currently taking opioid prescriptions. Functional ability and status Nutritional status Physical activity Advanced directives List of other physicians Hospitalizations, surgeries, and ER visits in previous 12 months Vitals Screenings to include cognitive, depression, and falls Referrals and appointments  In addition, I have reviewed and discussed with patient certain preventive protocols,  quality metrics, and best practice recommendations. A written personalized care plan for preventive services as well as general preventive health recommendations were provided to patient.   Angeline Fredericks, LPN   89/12/7972   After Visit Summary: (MyChart) Due to this being a telephonic visit, the after visit summary with patients personalized plan was offered to patient  via MyChart   Notes: Nothing significant to report at this time.

## 2024-09-26 ENCOUNTER — Other Ambulatory Visit (INDEPENDENT_AMBULATORY_CARE_PROVIDER_SITE_OTHER)

## 2024-09-26 DIAGNOSIS — E782 Mixed hyperlipidemia: Secondary | ICD-10-CM | POA: Diagnosis not present

## 2024-09-26 DIAGNOSIS — I10 Essential (primary) hypertension: Secondary | ICD-10-CM | POA: Diagnosis not present

## 2024-09-26 LAB — COMPREHENSIVE METABOLIC PANEL WITH GFR
ALT: 22 U/L (ref 0–35)
AST: 20 U/L (ref 0–37)
Albumin: 4.6 g/dL (ref 3.5–5.2)
Alkaline Phosphatase: 32 U/L — ABNORMAL LOW (ref 39–117)
BUN: 16 mg/dL (ref 6–23)
CO2: 30 meq/L (ref 19–32)
Calcium: 9 mg/dL (ref 8.4–10.5)
Chloride: 103 meq/L (ref 96–112)
Creatinine, Ser: 0.77 mg/dL (ref 0.40–1.20)
GFR: 74.58 mL/min (ref >60.00)
Glucose, Bld: 89 mg/dL (ref 70–99)
Potassium: 4.2 meq/L (ref 3.5–5.1)
Sodium: 140 meq/L (ref 135–145)
Total Bilirubin: 0.6 mg/dL (ref 0.2–1.2)
Total Protein: 6.6 g/dL (ref 6.0–8.3)

## 2024-09-26 LAB — LIPID PANEL
Cholesterol: 161 mg/dL (ref 0–200)
HDL: 73.2 mg/dL (ref >39.00)
LDL Cholesterol: 72 mg/dL (ref 0–99)
NonHDL: 87.46
Total CHOL/HDL Ratio: 2
Triglycerides: 78 mg/dL (ref 0.0–149.0)
VLDL: 15.6 mg/dL (ref 0.0–40.0)

## 2024-09-28 ENCOUNTER — Encounter: Payer: Self-pay | Admitting: Internal Medicine

## 2024-09-28 ENCOUNTER — Ambulatory Visit: Payer: BLUE CROSS/BLUE SHIELD | Admitting: Internal Medicine

## 2024-09-28 ENCOUNTER — Ambulatory Visit: Payer: Self-pay | Admitting: Internal Medicine

## 2024-09-28 VITALS — BP 138/66 | HR 101 | Ht 64.0 in | Wt 146.4 lb

## 2024-09-28 DIAGNOSIS — M25562 Pain in left knee: Secondary | ICD-10-CM | POA: Diagnosis not present

## 2024-09-28 DIAGNOSIS — E782 Mixed hyperlipidemia: Secondary | ICD-10-CM | POA: Diagnosis not present

## 2024-09-28 DIAGNOSIS — M25561 Pain in right knee: Secondary | ICD-10-CM

## 2024-09-28 DIAGNOSIS — M810 Age-related osteoporosis without current pathological fracture: Secondary | ICD-10-CM

## 2024-09-28 DIAGNOSIS — I10 Essential (primary) hypertension: Secondary | ICD-10-CM | POA: Diagnosis not present

## 2024-09-28 MED ORDER — AMLODIPINE BESYLATE 10 MG PO TABS: 10.0000 mg | ORAL_TABLET | Freq: Every day | ORAL | 1 refills | Status: DC

## 2024-09-28 MED ORDER — HYDROCHLOROTHIAZIDE 25 MG PO TABS: 25.0000 mg | ORAL_TABLET | Freq: Every day | ORAL | 3 refills | Status: AC

## 2024-09-28 MED ORDER — ALENDRONATE SODIUM 70 MG PO TABS: ORAL_TABLET | ORAL | 1 refills | Status: DC

## 2024-09-28 NOTE — Assessment & Plan Note (Signed)
 Not at goal on amlodipine  max dose.  Adding hydrochlorothiazide 25 mg

## 2024-09-28 NOTE — Patient Instructions (Addendum)
  You can take 3000 mg of acetominophen (tylenol ) every day safely  In divided doses (750 mg  every 6 hours  Or 1000 mg every 8 hours.)   I am ADDING hydrochlorothiazide  to your blood pressure regimen because your BP needs to be < 130/80 and it has not been at goal for the last 3 readings  Take hydrochlorothiazide in the morning  .  SUSPEND IT if you have any kind of illness that causes diarrhea or vomiting ,  until you are feeling better

## 2024-09-28 NOTE — Assessment & Plan Note (Signed)
 S/p change in therapy from raloxifene  to alendronate   due to treatment failure and side effects .  Repeat DEXA 2 yrs post change in therapy

## 2024-09-28 NOTE — Progress Notes (Signed)
 Patient ID: Shannon Hickman, female    DOB: 08-Jan-1946  Age: 78 y.o. MRN: [id number]  The patient is here for annual follow up  chronic and acute problems.   The risk factors are reflected in the social history.   The roster of all physicians providing medical care to patient - is listed in the Snapshot section of the chart.   Activities of daily living:  The patient is 100% independent in all ADLs: dressing, toileting, feeding as well as independent mobility   Home safety : The patient has smoke detectors in the home. They wear seatbelts.  There are no unsecured firearms at home. There is no violence in the home.    There is no risks for hepatitis, STDs or HIV. There is no   history of blood transfusion. They have no travel history to infectious disease endemic areas of the world.   The patient has seen their dentist in the last six month. They have seen their eye doctor in the last year. The patinet  denies slight hearing difficulty with regard to whispered voices and some television programs.  They have deferred audiologic testing in the last year.  They do not  have excessive sun exposure. Discussed the need for sun protection: hats, long sleeves and use of sunscreen if there is significant sun exposure.    Diet: the importance of a healthy diet is discussed. They do have a healthy diet.   The benefits of regular aerobic exercise were discussed. The patient 's  exercise regimen has been hindered by knee pain but she still walks   3 to 5 days per week  for  30 minutes.    Depression screen: there are no signs or vegative symptoms of depression- irritability, change in appetite, anhedonia, sadness/tearfullness.   The following portions of the patient's history were reviewed and updated as appropriate: allergies, current medications, past family history, past medical history,  past surgical history, past social history  and problem list.   Visual acuity was not assessed per patient preference  since the patient has regular follow up with an  ophthalmologist. Hearing and body mass index were assessed and reviewed.    During the course of the visit the patient was educated and counseled about appropriate screening and preventive services including : fall prevention , diabetes screening, nutrition counseling, colorectal cancer screening, and recommended immunizations.    Chief Complaint:  Bilateral knee pain and pes planus  has had DJD severe,  gait thas changed and now her right hip  has started hurting .  Taking 1000 mg tylenol  morning and evenimg  Hypertension: patient checks blood pressure monthly  at home.  Readings have been for the most part >130/80 at rest . Patient is following a reduced salt diet most days and is taking medications as prescribed   Review of Symptoms  Patient denies headache, fevers, malaise, unintentional weight loss, skin rash, eye pain, sinus congestion and sinus pain, sore throat, dysphagia,  hemoptysis , cough, dyspnea, wheezing, chest pain, palpitations, orthopnea, edema, abdominal pain, nausea, melena, diarrhea, constipation, flank pain, dysuria, hematuria, urinary  Frequency, nocturia, numbness, tingling, seizures,  Focal weakness, Loss of consciousness,  Tremor, insomnia, depression, anxiety, and suicidal ideation.    Physical Exam:  BP 138/66   Pulse (!) 101   Ht 5' 4 (1.626 m)   Wt 146 lb 6.4 oz (66.4 kg)   SpO2 96%   BMI 25.13 kg/m    Physical Exam Vitals reviewed.  Constitutional:  General: She is not in acute distress.    Appearance: Normal appearance. She is well-developed and normal weight. She is not ill-appearing, toxic-appearing or diaphoretic.  HENT:     Head: Normocephalic.     Right Ear: Tympanic membrane, ear canal and external ear normal. There is no impacted cerumen.     Left Ear: Tympanic membrane, ear canal and external ear normal. There is no impacted cerumen.     Nose: Nose normal.     Mouth/Throat:     Mouth:  Mucous membranes are moist.     Pharynx: Oropharynx is clear.  Eyes:     General: No scleral icterus.       Right eye: No discharge.        Left eye: No discharge.     Conjunctiva/sclera: Conjunctivae normal.     Pupils: Pupils are equal, round, and reactive to light.  Neck:     Thyroid : No thyromegaly.     Vascular: No carotid bruit or JVD.  Cardiovascular:     Rate and Rhythm: Normal rate and regular rhythm.     Heart sounds: Normal heart sounds.  Pulmonary:     Effort: Pulmonary effort is normal. No respiratory distress.     Breath sounds: Normal breath sounds.  Chest:  Breasts:    Breasts are symmetrical.     Right: Normal. No swelling, inverted nipple, mass, nipple discharge, skin change or tenderness.     Left: Normal. No swelling, inverted nipple, mass, nipple discharge, skin change or tenderness.  Abdominal:     General: Bowel sounds are normal.     Palpations: Abdomen is soft. There is no mass.     Tenderness: There is no abdominal tenderness. There is no guarding or rebound.  Musculoskeletal:        General: Normal range of motion.     Cervical back: Normal range of motion and neck supple.  Lymphadenopathy:     Cervical: No cervical adenopathy.     Upper Body:     Right upper body: No supraclavicular, axillary or pectoral adenopathy.     Left upper body: No supraclavicular, axillary or pectoral adenopathy.  Skin:    General: Skin is warm and dry.  Neurological:     General: No focal deficit present.     Mental Status: She is alert and oriented to person, place, and time. Mental status is at baseline.  Psychiatric:        Mood and Affect: Mood normal.        Behavior: Behavior normal.        Thought Content: Thought content normal.        Judgment: Judgment normal.     Assessment and Plan: Essential hypertension Assessment & Plan: Not at goal on amlodipine  max dose.  Adding hydrochlorothiazide 25 mg    Mixed hyperlipidemia Assessment & Plan: Controlled  on  80 mg atorvastatin  daily.  LFTs normal.  Lab Results  Component Value Date   CHOL 161 09/26/2024   HDL 73.20 09/26/2024   LDLCALC 72 09/26/2024   LDLDIRECT 68.0 03/25/2024   TRIG 78.0 09/26/2024   CHOLHDL 2 09/26/2024   Lab Results  Component Value Date   ALT 22 09/26/2024   AST 20 09/26/2024   ALKPHOS 32 (L) 09/26/2024   BILITOT 0.6 09/26/2024      Age-related osteoporosis without current pathological fracture Assessment & Plan: S/p change in therapy from raloxifene  to alendronate   due to treatment failure and side effects .  Repeat DEXA 2 yrs post change in therapy   Bilateral anterior knee pain Assessment & Plan: Her pain is now acitivity limiting and gait altering ; agree with consideration of surgery    Other orders -     Alendronate  Sodium; Take with a full glass of water on an empty stomach.TAKE 1 TABLET BY MOUTH EVERY 7 DAYS. TAKE WITH A FULL GLASS OF WATER ON AN EMPTY STOMACH  Dispense: 12 tablet; Refill: 1 -     amLODIPine  Besylate; Take 1 tablet (10 mg total) by mouth daily.  Dispense: 90 tablet; Refill: 1 -     hydroCHLOROthiazide; Take 1 tablet (25 mg total) by mouth daily.  Dispense: 90 tablet; Refill: 3    Return in about 6 months (around 03/29/2025) for hypertension.  Verneita LITTIE Kettering, MD

## 2024-09-28 NOTE — Assessment & Plan Note (Signed)
 Controlled on  80 mg atorvastatin  daily.  LFTs normal.  Lab Results  Component Value Date   CHOL 161 09/26/2024   HDL 73.20 09/26/2024   LDLCALC 72 09/26/2024   LDLDIRECT 68.0 03/25/2024   TRIG 78.0 09/26/2024   CHOLHDL 2 09/26/2024   Lab Results  Component Value Date   ALT 22 09/26/2024   AST 20 09/26/2024   ALKPHOS 32 (L) 09/26/2024   BILITOT 0.6 09/26/2024

## 2024-09-28 NOTE — Assessment & Plan Note (Signed)
 Her pain is now acitivity limiting and gait altering ; agree with consideration of surgery

## 2024-10-03 ENCOUNTER — Other Ambulatory Visit

## 2024-10-05 ENCOUNTER — Telehealth: Payer: Self-pay

## 2024-10-05 ENCOUNTER — Encounter: Admitting: Internal Medicine

## 2024-10-05 NOTE — Telephone Encounter (Signed)
 Received a surgical clearance for a right total knee arthroplasty. Date of surgery is 12/27/2024. Does pt need to schedule an appt for a surgical clearance. Last appt was 09/28/2024.

## 2024-10-12 NOTE — Telephone Encounter (Signed)
 Pt has been scheduled for 11/12/20205.

## 2024-10-12 NOTE — Telephone Encounter (Signed)
 LMTCB. Please schedule pt for a surgical clearance appt before her schedule surgery on 12/27/2024.

## 2024-10-12 NOTE — Telephone Encounter (Unsigned)
 Copied from CRM [id number]. Topic: General - Other >> Oct 12, 2024  9:11 AM Shannon Hickman wrote: Reason for CRM: Patient called in regarding a missed call from [facility], patient needs to be scheduled for a pre op appointment but nothing before her scheduled surgery

## 2024-10-16 DIAGNOSIS — N390 Urinary tract infection, site not specified: Secondary | ICD-10-CM | POA: Diagnosis not present

## 2024-10-16 DIAGNOSIS — R350 Frequency of micturition: Secondary | ICD-10-CM | POA: Diagnosis not present

## 2024-10-16 DIAGNOSIS — R8290 Unspecified abnormal findings in urine: Secondary | ICD-10-CM | POA: Diagnosis not present

## 2024-10-20 DIAGNOSIS — Z961 Presence of intraocular lens: Secondary | ICD-10-CM | POA: Diagnosis not present

## 2024-10-20 DIAGNOSIS — H532 Diplopia: Secondary | ICD-10-CM | POA: Diagnosis not present

## 2024-10-20 DIAGNOSIS — H43813 Vitreous degeneration, bilateral: Secondary | ICD-10-CM | POA: Diagnosis not present

## 2024-10-26 ENCOUNTER — Ambulatory Visit (INDEPENDENT_AMBULATORY_CARE_PROVIDER_SITE_OTHER): Admitting: Internal Medicine

## 2024-10-26 ENCOUNTER — Encounter: Payer: Self-pay | Admitting: Internal Medicine

## 2024-10-26 ENCOUNTER — Ambulatory Visit (INDEPENDENT_AMBULATORY_CARE_PROVIDER_SITE_OTHER)

## 2024-10-26 VITALS — BP 130/58 | HR 85 | Ht 64.0 in | Wt 143.4 lb

## 2024-10-26 DIAGNOSIS — R8281 Pyuria: Secondary | ICD-10-CM | POA: Diagnosis not present

## 2024-10-26 DIAGNOSIS — Z01818 Encounter for other preprocedural examination: Secondary | ICD-10-CM

## 2024-10-26 DIAGNOSIS — R5383 Other fatigue: Secondary | ICD-10-CM | POA: Diagnosis not present

## 2024-10-26 DIAGNOSIS — R7301 Impaired fasting glucose: Secondary | ICD-10-CM

## 2024-10-26 LAB — CBC WITH DIFFERENTIAL/PLATELET
Basophils Absolute: 0 K/uL (ref 0.0–0.1)
Basophils Relative: 0.4 % (ref 0.0–3.0)
Eosinophils Absolute: 0.1 K/uL (ref 0.0–0.7)
Eosinophils Relative: 1.1 % (ref 0.0–5.0)
HCT: 41.2 % (ref 36.0–46.0)
Hemoglobin: 14 g/dL (ref 12.0–15.0)
Lymphocytes Relative: 19.5 % (ref 12.0–46.0)
Lymphs Abs: 1.2 K/uL (ref 0.7–4.0)
MCHC: 34 g/dL (ref 30.0–36.0)
MCV: 99.6 fl (ref 78.0–100.0)
Monocytes Absolute: 0.5 K/uL (ref 0.1–1.0)
Monocytes Relative: 8.2 % (ref 3.0–12.0)
Neutro Abs: 4.2 K/uL (ref 1.4–7.7)
Neutrophils Relative %: 70.8 % (ref 43.0–77.0)
Platelets: 208 K/uL (ref 150.0–400.0)
RBC: 4.14 Mil/uL (ref 3.87–5.11)
RDW: 12.5 % (ref 11.5–15.5)
WBC: 5.9 K/uL (ref 4.0–10.5)

## 2024-10-26 LAB — COMPREHENSIVE METABOLIC PANEL WITH GFR
ALT: 24 U/L (ref 0–35)
AST: 24 U/L (ref 0–37)
Albumin: 4.5 g/dL (ref 3.5–5.2)
Alkaline Phosphatase: 37 U/L — ABNORMAL LOW (ref 39–117)
BUN: 23 mg/dL (ref 6–23)
CO2: 32 meq/L (ref 19–32)
Calcium: 9.6 mg/dL (ref 8.4–10.5)
Chloride: 100 meq/L (ref 96–112)
Creatinine, Ser: 0.99 mg/dL (ref 0.40–1.20)
GFR: 55.13 mL/min — ABNORMAL LOW (ref >60.00)
Glucose, Bld: 106 mg/dL — ABNORMAL HIGH (ref 70–99)
Potassium: 4.4 meq/L (ref 3.5–5.1)
Sodium: 139 meq/L (ref 135–145)
Total Bilirubin: 0.4 mg/dL (ref 0.2–1.2)
Total Protein: 7.2 g/dL (ref 6.0–8.3)

## 2024-10-26 LAB — HEMOGLOBIN A1C: Hgb A1c MFr Bld: 5.5 % (ref 4.6–6.5)

## 2024-10-26 NOTE — Assessment & Plan Note (Signed)
 Suggested by review of last 3 UA/cultures done in Sept, Oct and Nov by [facility].   Onely one in 3 was diagnostic of infection.  pyuria noted on the other 2.  Advised to abstain from using antibiotics unless infection is confirmed. Consider urology evaluation.  Today's UA is NORMAL

## 2024-10-26 NOTE — Progress Notes (Unsigned)
 Subjective:  Patient ID: Shannon Hickman, female    DOB: July 01, 1946  Age: 78 y.o. MRN: [id number]  CC: The primary encounter diagnosis was Pre-op examination. Diagnoses of Impaired fasting glucose, Other fatigue, Sterile pyuria, and Preoperative clearance were also pertinent to this visit.   HPI Shannon Hickman presents for  Chief Complaint  Patient presents with   Pre-op Exam   Shannon Hickman is a 78 yr old female who presents for preoperative medical clearance, requested by her orthopedist, Dempsey Gutta MD for future right knee replacement   which is scheduled for  Dec 28 2023.  She has a history of nonrheumatic aortic regurgitation  which was discovered in 2021 during workup for syncope and has not progressed.  She denies any recent episodes of chest pain , syncope,  dyspnea or orthopnea  and has had annual  follow up with Dr Ammon  , last visit in June 13 2024 .  Her last 2D echocardiogram was done in 06/23/2023 revealed LVEF 50-55%, with mild aortic insufficiency, mitral regurgitation, and tricuspid regurgitation   She has no history of DM or CKD . Her HTN is controlled with amlodipine  and hydrochlorothiazide  She has osteoporosis managed with alendronate   She has been treated 3 times for UTIs  that presented with dysuria in the past  3 months; however review of available micorbiologic  data   not that only  was confirmed with a positive culture,  in September .  The cultures in [place] and November were negative for infection       Outpatient Medications Prior to Visit  Medication Sig Dispense Refill   acetaminophen  (TYLENOL ) 325 MG tablet Take 650 mg by mouth every 6 (six) hours as needed.     alendronate  (FOSAMAX ) 70 MG tablet Take with a full glass of water on an empty stomach.TAKE 1 TABLET BY MOUTH EVERY 7 DAYS. TAKE WITH A FULL GLASS OF WATER ON AN EMPTY STOMACH 12 tablet 1   amLODipine  (NORVASC ) 10 MG tablet Take 1 tablet (10 mg total) by mouth daily. 90 tablet 1   atorvastatin   (LIPITOR) 40 MG tablet Take 1 tablet (40 mg total) by mouth daily. 90 tablet 3   CALCIUM  PO Take by mouth daily.     celecoxib (CELEBREX) 100 MG capsule Take 100 mg by mouth 2 (two) times daily.     cetirizine (ZYRTEC) 10 MG tablet Take 10 mg by mouth daily.      Coenzyme Q10 (COQ10) 100 MG CAPS Take 1 capsule by mouth daily.     hydrochlorothiazide (HYDRODIURIL) 25 MG tablet Take 1 tablet (25 mg total) by mouth daily. 90 tablet 3   metroNIDAZOLE  (METROGEL ) 0.75 % gel Apply 1 Application topically at bedtime.     Misc Natural Products (OSTEO BI-FLEX ADV TRIPLE ST PO)      Multiple Vitamins-Minerals (CENTRUM SILVER ULTRA WOMENS PO) Take 1 tablet by mouth daily.     vitamin B-12 (CYANOCOBALAMIN ) 1000 MCG tablet Take 1,000 mcg by mouth daily.     No facility-administered medications prior to visit.    Review of Systems;  Patient denies headache, fevers, malaise, unintentional weight loss, skin rash, eye pain, sinus congestion and sinus pain, sore throat, dysphagia,  hemoptysis , cough, dyspnea, wheezing, chest pain, palpitations, orthopnea, edema, abdominal pain, nausea, melena, diarrhea, constipation, flank pain, dysuria, hematuria, urinary  Frequency, nocturia, numbness, tingling, seizures,  Focal weakness, Loss of consciousness,  Tremor, insomnia, depression, anxiety, and suicidal ideation.      Objective:  BP (!) 130/58   Pulse 85   Ht 5' 4 (1.626 m)   Wt 143 lb 6.4 oz (65 kg)   SpO2 99%   BMI 24.61 kg/m   BP Readings from Last 3 Encounters:  10/26/24 (!) 130/58  09/28/24 138/66  03/25/24 134/64    Wt Readings from Last 3 Encounters:  10/26/24 143 lb 6.4 oz (65 kg)  09/28/24 146 lb 6.4 oz (66.4 kg)  09/21/24 145 lb (65.8 kg)    Physical Exam Vitals reviewed.  Constitutional:      General: She is not in acute distress.    Appearance: Normal appearance. She is normal weight. She is not ill-appearing, toxic-appearing or diaphoretic.  HENT:     Head: Normocephalic.   Eyes:     General: No scleral icterus.       Right eye: No discharge.        Left eye: No discharge.     Conjunctiva/sclera: Conjunctivae normal.  Cardiovascular:     Rate and Rhythm: Normal rate and regular rhythm.     Heart sounds: Normal heart sounds.  Pulmonary:     Effort: Pulmonary effort is normal. No respiratory distress.     Breath sounds: Normal breath sounds.  Musculoskeletal:        General: Normal range of motion.  Skin:    General: Skin is warm and dry.  Neurological:     General: No focal deficit present.     Mental Status: She is alert and oriented to person, place, and time. Mental status is at baseline.  Psychiatric:        Mood and Affect: Mood normal.        Behavior: Behavior normal.        Thought Content: Thought content normal.        Judgment: Judgment normal.     Lab Results  Component Value Date   HGBA1C 5.5 10/26/2024   HGBA1C 5.5 09/25/2023   HGBA1C 5.7 09/24/2022    Lab Results  Component Value Date   CREATININE 0.99 10/26/2024   CREATININE 0.77 09/26/2024   CREATININE 0.65 03/25/2024    Lab Results  Component Value Date   WBC 5.9 10/26/2024   HGB 14.0 10/26/2024   HCT 41.2 10/26/2024   PLT 208.0 10/26/2024   GLUCOSE 106 (H) 10/26/2024   CHOL 161 09/26/2024   TRIG 78.0 09/26/2024   HDL 73.20 09/26/2024   LDLDIRECT 68.0 03/25/2024   LDLCALC 72 09/26/2024   ALT 24 10/26/2024   AST 24 10/26/2024   NA 139 10/26/2024   K 4.4 10/26/2024   CL 100 10/26/2024   CREATININE 0.99 10/26/2024   BUN 23 10/26/2024   CO2 32 10/26/2024   TSH 0.42 03/25/2024   HGBA1C 5.5 10/26/2024    No results found.  Assessment & Plan:  .Pre-op examination -     EKG 12-Lead -     CBC with Differential/Platelet -     Urinalysis, Routine w reflex microscopic -     DG Chest 2 View; Future  Impaired fasting glucose -     Comprehensive metabolic panel with GFR -     Hemoglobin A1c  Other fatigue -     CBC with Differential/Platelet  Sterile  pyuria Assessment & Plan: Suggested by review of last 3 UA/cultures done in Sept, Oct and Nov by [facility].   Onely one in 3 was diagnostic of infection.  pyuria noted on the other 2.  Advised to abstain from  using antibiotics unless infection is confirmed. Consider urology evaluation.  Today's UA is NORMAL    Orders: -     Ambulatory referral to Urology  Preoperative clearance Assessment & Plan: I have ordered and reviewed a 12 lead EKG and find that there are no acute changes and patient is in sinus rhythm.   I have ordered and reviewed a  pa/LATERAL CHEST X RAY and find that there are no acute changes; LUNGS and heart appear to be normal.  Patient  is considered to be at low risk  For perioperative complications  Based on today's exam and history.        I n addition to time spent reviewing EKG and chest x ray, I spent 34 minutes on the day of this face to face encounter reviewing patient's  most recent visit with cardiology,  her last 3 acute care visits for UTI symptoms,   last ECHO , as well as ordering and reviewing  labs  Follow-up: Return in about 6 months (around 04/25/2025) for hypertension.   Verneita LITTIE Kettering, MD

## 2024-10-26 NOTE — Patient Instructions (Signed)
 IF YOU HAVE ANY MORE EPISODES OF UTI SYMPTOMS DURING THE WEEKDAY, PLEASE CALL OUR OFFICE AND REQUEST A LAB APPT AND I WILL TEST YOUR URINE  YOUR LAST 2 UTI'S WERE NOT ACTUAL INFECTIONS BASED ON THE DATA REPORTED BY URGENT CARE  REFERRAL TO UROLOGY IS IN PROGRESS TO EVALUATE YOUR RECURRENT SYMPTOMS

## 2024-10-27 LAB — URINALYSIS, ROUTINE W REFLEX MICROSCOPIC
Bilirubin Urine: NEGATIVE
Hgb urine dipstick: NEGATIVE
Ketones, ur: NEGATIVE
Leukocytes,Ua: NEGATIVE
Nitrite: NEGATIVE
RBC / HPF: NONE SEEN (ref 0–?)
Specific Gravity, Urine: <=1.005 — AB (ref 1.000–1.030)
Total Protein, Urine: NEGATIVE
Urine Glucose: NEGATIVE
Urobilinogen, UA: 0.2 (ref 0.0–1.0)
WBC, UA: NONE SEEN (ref 0–?)
pH: 6.5 (ref 5.0–8.0)

## 2024-10-28 DIAGNOSIS — Z01818 Encounter for other preprocedural examination: Secondary | ICD-10-CM | POA: Insufficient documentation

## 2024-10-28 NOTE — Assessment & Plan Note (Signed)
 I have ordered and reviewed a 12 lead EKG and find that there are no acute changes and patient is in sinus rhythm.   I have ordered and reviewed a  pa/LATERAL CHEST X RAY and find that there are no acute changes; LUNGS and heart appear to be normal.  Patient  is considered to be at low risk  For perioperative complications  Based on today's exam and history.

## 2024-10-29 ENCOUNTER — Ambulatory Visit: Payer: Self-pay | Admitting: Internal Medicine

## 2024-11-01 NOTE — Telephone Encounter (Signed)
 Surgical clearance form has been completed and faxed.

## 2024-11-14 ENCOUNTER — Ambulatory Visit: Payer: BLUE CROSS/BLUE SHIELD | Admitting: Dermatology

## 2024-11-14 DIAGNOSIS — Z1283 Encounter for screening for malignant neoplasm of skin: Secondary | ICD-10-CM | POA: Diagnosis not present

## 2024-11-14 DIAGNOSIS — D492 Neoplasm of unspecified behavior of bone, soft tissue, and skin: Secondary | ICD-10-CM

## 2024-11-14 DIAGNOSIS — L719 Rosacea, unspecified: Secondary | ICD-10-CM

## 2024-11-14 DIAGNOSIS — D229 Melanocytic nevi, unspecified: Secondary | ICD-10-CM

## 2024-11-14 DIAGNOSIS — D2272 Melanocytic nevi of left lower limb, including hip: Secondary | ICD-10-CM

## 2024-11-14 DIAGNOSIS — D224 Melanocytic nevi of scalp and neck: Secondary | ICD-10-CM

## 2024-11-14 DIAGNOSIS — L821 Other seborrheic keratosis: Secondary | ICD-10-CM | POA: Diagnosis not present

## 2024-11-14 DIAGNOSIS — W908XXA Exposure to other nonionizing radiation, initial encounter: Secondary | ICD-10-CM | POA: Diagnosis not present

## 2024-11-14 DIAGNOSIS — L814 Other melanin hyperpigmentation: Secondary | ICD-10-CM | POA: Diagnosis not present

## 2024-11-14 DIAGNOSIS — D1801 Hemangioma of skin and subcutaneous tissue: Secondary | ICD-10-CM

## 2024-11-14 DIAGNOSIS — L578 Other skin changes due to chronic exposure to nonionizing radiation: Secondary | ICD-10-CM

## 2024-11-14 HISTORY — DX: Melanocytic nevi, unspecified: D22.9

## 2024-11-14 NOTE — Progress Notes (Signed)
 Follow-Up Visit   Subjective  Shannon Hickman is a 78 y.o. female who presents for the following: Skin Cancer Screening and Full Body  Skin Exam Rosacea face, Metronidazole  0.75% cr orb  The patient presents for Total-Body Skin Exam (TBSE) for skin cancer screening and mole check. The patient has spots, moles and lesions to be evaluated, some may be new or changing and the patient may have concern these could be cancer.    The following portions of the chart were reviewed this encounter and updated as appropriate: medications, allergies, medical history  Review of Systems:  No other skin or systemic complaints except as noted in HPI or Assessment and Plan.  Objective  Well appearing patient in no apparent distress; mood and affect are within normal limits.  A full examination was performed including scalp, head, eyes, ears, nose, lips, neck, chest, axillae, abdomen, back, buttocks, bilateral upper extremities, bilateral lower extremities, hands, feet, fingers, toes, fingernails, and toenails. All findings within normal limits unless otherwise noted below.   Relevant physical exam findings are noted in the Assessment and Plan.  L upper elbow 3.27mm dark brown macule   Assessment & Plan   SKIN CANCER SCREENING PERFORMED TODAY.  ACTINIC DAMAGE - Chronic condition, secondary to cumulative UV/sun exposure - diffuse scaly erythematous macules with underlying dyspigmentation - Recommend daily broad spectrum sunscreen SPF 30+ to sun-exposed areas, reapply every 2 hours as needed.  - Staying in the shade or wearing long sleeves, sun glasses (UVA+UVB protection) and wide brim hats (4-inch brim around the entire circumference of the hat) are also recommended for sun protection.  - Call for new or changing lesions.  LENTIGINES, SEBORRHEIC KERATOSES, HEMANGIOMAS - Benign normal skin lesions - Benign-appearing - Call for any changes  MELANOCYTIC NEVI - Tan-brown and/or pink-flesh-colored  symmetric macules and papules - R ant neck 2.5 mm med brown macule 2 tone with notch - Left pretibia 3 x 4 mm brown macule - Benign appearing on exam today - Observation - Call clinic for new or changing moles - Recommend daily use of broad spectrum spf 30+ sunscreen to sun-exposed areas.    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 Currently well-controlled.   Rosacea is a chronic progressive skin condition usually affecting the face of adults, causing redness and/or acne bumps. It is treatable but not curable. It sometimes affects the eyes (ocular rosacea) as well. It may respond to topical and/or systemic medication and can flare with stress, sun exposure, alcohol, exercise, topical steroids (including hydrocortisone/cortisone 10) and some foods.  Daily application of broad spectrum spf 30+ sunscreen to face is recommended to reduce flares.   Treatment Plan Cont Metronidazole  0.75% gel qd/bid prn flares (pt will call for refills)  NEOPLASM OF SKIN L upper elbow Epidermal / dermal shaving  Lesion diameter (cm):  0.5 Informed consent: discussed and consent obtained   Patient was prepped and draped in usual sterile fashion: area prepped with alcohol. Anesthesia: the lesion was anesthetized in a standard fashion   Anesthetic:  1% lidocaine  w/ epinephrine  1-100,000 buffered w/ 8.4% NaHCO3 Instrument used: flexible razor blade   Hemostasis achieved with: pressure, aluminum chloride and electrodesiccation   Outcome: patient tolerated procedure well   Post-procedure details: wound care instructions given   Post-procedure details comment:  Ointment and small bandage applied  Specimen 1 - Surgical pathology Differential Diagnosis: Nevus vs Dysplastic Nevus  Check Margins: yes 3.85mm  dark brown macule Return in about 1 year (around 11/14/2025) for TBSE.  I, Grayce Saunas, RMA, am acting as scribe for  Rexene Rattler, MD .   Documentation: I have reviewed the above documentation for accuracy and completeness, and I agree with the above.  Rexene Rattler, MD

## 2024-11-14 NOTE — Patient Instructions (Signed)

## 2024-11-15 ENCOUNTER — Ambulatory Visit: Payer: Self-pay | Admitting: Dermatology

## 2024-11-15 ENCOUNTER — Encounter: Payer: Self-pay | Admitting: Dermatology

## 2024-11-15 LAB — SURGICAL PATHOLOGY

## 2024-11-15 NOTE — Telephone Encounter (Signed)
-----   Message from Rexene Rattler sent at 11/15/2024  5:06 PM EST ----- 1. Skin, left upper elbow :       DYSPLASTIC JUNCTIONAL NEVUS WITH MODERATE ATYPIA, LIMITED MARGINS FREE   Moderately atypical mole, recommend observation - please call patient ----- Message ----- From: Interface, Lab In Three Zero One Sent: 11/15/2024   4:23 PM EST To: Rexene Rattler, MD

## 2024-11-15 NOTE — Telephone Encounter (Signed)
 Advised pt of bx results/sh ?

## 2024-11-16 ENCOUNTER — Encounter: Payer: Self-pay | Admitting: Urology

## 2024-11-16 ENCOUNTER — Ambulatory Visit: Admitting: Urology

## 2024-11-16 VITALS — BP 157/73 | HR 101 | Ht 64.5 in | Wt 145.0 lb

## 2024-11-16 DIAGNOSIS — N390 Urinary tract infection, site not specified: Secondary | ICD-10-CM

## 2024-11-16 DIAGNOSIS — R8281 Pyuria: Secondary | ICD-10-CM | POA: Diagnosis not present

## 2024-11-16 DIAGNOSIS — R339 Retention of urine, unspecified: Secondary | ICD-10-CM | POA: Diagnosis not present

## 2024-11-16 LAB — URINALYSIS, COMPLETE
Bilirubin, UA: NEGATIVE
Glucose, UA: NEGATIVE
Ketones, UA: NEGATIVE
Leukocytes,UA: NEGATIVE
Nitrite, UA: NEGATIVE
Protein,UA: NEGATIVE
RBC, UA: NEGATIVE
Specific Gravity, UA: <1.005 — ABNORMAL LOW (ref 1.005–1.030)
Urobilinogen, Ur: 0.2 mg/dL (ref 0.2–1.0)
pH, UA: 6 (ref 5.0–7.5)

## 2024-11-16 LAB — MICROSCOPIC EXAMINATION
Epithelial Cells (non renal): >10 /HPF — AB (ref 0–10)
RBC, Urine: NONE SEEN /HPF (ref 0–2)

## 2024-11-16 LAB — BLADDER SCAN AMB NON-IMAGING: Scan Result: 205

## 2024-11-16 NOTE — Patient Instructions (Signed)
 Schedule [phone]

## 2024-11-16 NOTE — Progress Notes (Unsigned)
 11/16/2024 8:56 AM   Arland LILLETTE Blush November 20, 1946 [id number]  Referring provider: Marylynn Verneita CROME, MD [address] [place],  [place] [address]  Chief Complaint  Patient presents with   sterile pyuria    HPI: Shannon Hickman is a 78 y.o. female referred for evaluation of sterile pyuria.  Seen [facility] 08/29/2024 complaining of bladder pressure and pink-tinged urine.  Urinalysis was nitrite positive with significant pyuria and she was treated with cefdinir.  Urine culture grew Klebsiella resistant to nitrofurantoin and ampicillin Symptoms resolved however she returned to walk-in clinic 09/16/2024 complaining of frequency, urgency, dysuria, lower abdominal pain and malodorous urine.  Urinalysis showed 9 WBCs and she was treated with Cipro.  Urine culture grew mixed flora She had recurrent symptoms and was seen 10/16/2024 with complaints of suprapubic pressure, dysuria and cloudy urine.  UA at that visit was nitrite positive with 10-50 WBCs.  She was treated with Keflex.  Urine culture was negative Since that last visit she has been asymptomatic.  A urinalysis 10/26/2024 was negative   PMH: Past Medical History:  Diagnosis Date   Allergy    seasonal   Arthritis    Atypical mole 11/14/2024   L upper elbow, moderate atypia   Chicken pox    Diverticulitis    Heart murmur    High cholesterol    Hypertension    Inflammatory polyps of colon (HCC)    Migraine    Urinary tract infection     Surgical History: Past Surgical History:  Procedure Laterality Date   APPENDECTOMY  12/15/1996   BUNIONECTOMY Left 01/08/2017   Procedure: Arthrodesis Metatarsalphalangeal joint MTPJ Left;  Surgeon: Donnice Cory, DPM;  Location: [facility];  Service: Podiatry;  Laterality: Left;   BUNIONECTOMY Right 06/26/2021   Procedure: BUNIONECTOMY- LAPIDUS TYPE, PHALANX OSTEOTOMY- AKIN;  Surgeon: Ashley Soulier, DPM;  Location: [facility];  Service: Podiatry;   Laterality: Right;   CATARACT EXTRACTION W/PHACO Right 11/18/2023   Procedure: CATARACT EXTRACTION PHACO AND INTRAOCULAR LENS PLACEMENT (IOC) RIGHT 6.01 00:36.0;  Surgeon: Mittie Gaskin, MD;  Location: [facility];  Service: Ophthalmology;  Laterality: Right;   CATARACT EXTRACTION W/PHACO Left 12/02/2023   Procedure: CATARACT EXTRACTION PHACO AND INTRAOCULAR LENS PLACEMENT (IOC) LEFT 6.01 00:33.8;  Surgeon: Mittie Gaskin, MD;  Location: [facility];  Service: Ophthalmology;  Laterality: Left;   COLON SURGERY  1998   ruptured diverticulum   HISTOPLASMOSIS     KNEE ARTHROSCOPY Left 08/02/2015   Procedure: ARTHROSCOPY KNEE, PARTIAL MEDIAL MENISCECTOMY;  Surgeon: Ozell Flake, MD;  Location: [facility] ORS;  Service: Orthopedics;  Laterality: Left;   PARTIAL COLECTOMY     diverticular rupture, [facility] Medications:  Allergies as of 11/16/2024       Reactions   Minocycline    blisterinr oral ulcers        Medication List        Accurate as of November 16, 2024  8:56 AM. If you have any questions, ask your nurse or doctor.          acetaminophen  325 MG tablet Commonly known as: TYLENOL  Take 650 mg by mouth every 6 (six) hours as needed.   alendronate  70 MG tablet Commonly known as: FOSAMAX  Take with a full glass of water on an empty stomach.TAKE 1 TABLET BY MOUTH EVERY 7 DAYS. TAKE WITH A FULL GLASS OF WATER ON AN EMPTY STOMACH   amLODipine  10 MG tablet Commonly known  as: NORVASC  Take 1 tablet (10 mg total) by mouth daily.   atorvastatin  40 MG tablet Commonly known as: LIPITOR Take 1 tablet (40 mg total) by mouth daily.   CALCIUM  PO Take by mouth daily.   celecoxib 100 MG capsule Commonly known as: CELEBREX Take 100 mg by mouth 2 (two) times daily.   CENTRUM SILVER ULTRA WOMENS PO Take 1 tablet by mouth daily.   cetirizine 10 MG tablet Commonly known as: ZYRTEC Take 10 mg by mouth daily.   CoQ10 100 MG Caps Take 1 capsule  by mouth daily.   cyanocobalamin  1000 MCG tablet Commonly known as: VITAMIN B12 Take 1,000 mcg by mouth daily.   hydrochlorothiazide  25 MG tablet Commonly known as: HYDRODIURIL  Take 1 tablet (25 mg total) by mouth daily.   metroNIDAZOLE  0.75 % gel Commonly known as: METROGEL  Apply 1 Application topically at bedtime.   OSTEO BI-FLEX ADV TRIPLE ST PO        Allergies:  Allergies  Allergen Reactions   Minocycline     blisterinr oral ulcers    Family History: Family History  Problem Relation Age of Onset   Heart disease Father 29   Diabetes Father 9   Stomach cancer Maternal Grandfather    Cancer Neg Hx     Social History:  reports that she has never smoked. She has never used smokeless tobacco. She reports that she does not drink alcohol. No history on file for drug use.   Physical Exam: BP (!) 157/73   Pulse (!) 101   Ht 5' 4.5 (1.638 m)   Wt 145 lb (65.8 kg)   BMI 24.50 kg/m   Constitutional:  Alert, No acute distress. HEENT: [place] AT Respiratory: Normal respiratory effort, no increased work of breathing. Psychiatric: Normal mood and affect.  Laboratory Data:  Urinalysis She was unable to give a urine specimen today   Assessment & Plan:   78 year old female with recurrent suprapubic pressure, dysuria and pyuria She has had 1 positive urine culture for Klebsiella and 2 negative cultures when symptomatic Symptoms have resolved on antibiotics She was unable to give a urine today and will bring 1 in later this afternoon Bladder scan was performed this morning and her bladder volume was 205 mL Schedule renal ultrasound for upper tract evaluation We discussed the possible recommendation of cystoscopy once above studies completed   Glendia JAYSON Barba, MD  [facility] [address] [place], [place] [address] [phone]

## 2024-11-17 ENCOUNTER — Encounter: Payer: Self-pay | Admitting: Urology

## 2024-11-21 ENCOUNTER — Ambulatory Visit: Admission: RE | Admit: 2024-11-21 | Discharge: 2024-11-21 | Attending: Urology | Admitting: Urology

## 2024-11-21 DIAGNOSIS — N281 Cyst of kidney, acquired: Secondary | ICD-10-CM | POA: Diagnosis not present

## 2024-11-21 DIAGNOSIS — R8281 Pyuria: Secondary | ICD-10-CM

## 2024-11-21 DIAGNOSIS — N390 Urinary tract infection, site not specified: Secondary | ICD-10-CM | POA: Diagnosis present

## 2024-11-24 DIAGNOSIS — M1711 Unilateral primary osteoarthritis, right knee: Secondary | ICD-10-CM | POA: Diagnosis not present

## 2024-11-26 ENCOUNTER — Ambulatory Visit: Payer: Self-pay | Admitting: Urology

## 2024-11-28 NOTE — Telephone Encounter (Signed)
 Spoke with patient appt scheduled:  Friday 12/19 @ 10 for nurse visit/bladder scan

## 2024-11-28 NOTE — Progress Notes (Signed)
Left message to return the call

## 2024-11-30 DIAGNOSIS — M25561 Pain in right knee: Secondary | ICD-10-CM | POA: Diagnosis not present

## 2024-12-02 ENCOUNTER — Ambulatory Visit: Payer: Self-pay | Admitting: Urology

## 2024-12-02 ENCOUNTER — Ambulatory Visit: Admitting: Urology

## 2024-12-02 DIAGNOSIS — R339 Retention of urine, unspecified: Secondary | ICD-10-CM | POA: Diagnosis not present

## 2024-12-02 LAB — BLADDER SCAN AMB NON-IMAGING: Scan Result: 4

## 2024-12-02 NOTE — Progress Notes (Signed)
 Pt here today for bladder scan with PVR. No UA need per Dr. Twylla. PVR was 4 ml.  Mathew Pinal, RN

## 2025-01-03 ENCOUNTER — Telehealth: Payer: Self-pay

## 2025-01-03 ENCOUNTER — Other Ambulatory Visit

## 2025-01-03 ENCOUNTER — Ambulatory Visit: Payer: Self-pay

## 2025-01-03 DIAGNOSIS — R300 Dysuria: Secondary | ICD-10-CM

## 2025-01-03 NOTE — Telephone Encounter (Signed)
 Sonny, triage nurse, called to state she is sending a detailed note to clinical staff regarding patient.

## 2025-01-03 NOTE — Telephone Encounter (Signed)
" °  FYI Only or Action Required?: Action required by provider: clinical question for provider.  Patient was last seen in primary care on 10/26/2024 by Marylynn Verneita CROME, MD.  Called Nurse Triage reporting urinary symptoms.  Symptoms began today.  Interventions attempted: Nothing.  Symptoms are: stable. Has pressure, burning, pink tinged urine. Had total knee replacement and mobility still limited. Asking if urine sample can be brought into office. Spoke with Suzen in the practice and will send triage for review. Please advise pt.  Triage Disposition: No disposition on file.  Patient/caregiver understands and will follow disposition?:      Reason for Disposition  Urinating more frequently than usual (i.e., frequency) OR new-onset of the feeling of an urgent need to urinate (i.e., urgency)  Answer Assessment - Initial Assessment Questions 1. SYMPTOM: What's the main symptom you're concerned about? (e.g., frequency, incontinence)     Pressure, burning,pink tinge urine 2. ONSET: When did the    start?     today 3. PAIN: Is there any pain? If Yes, ask: How bad is it? (Scale: 1-10; mild, moderate, severe)     1 4. CAUSE: What do you think is causing the symptoms?     Maybe UTI 5. OTHER SYMPTOMS: Do you have any other symptoms? (e.g., blood in urine, fever, flank pain, pain with urination)     NO 6. PREGNANCY: Is there any chance you are pregnant? When was your last menstrual period?     NO  Protocols used: Urinary Symptoms-A-AH  "

## 2025-01-03 NOTE — Telephone Encounter (Signed)
 Spoke with pt and she will bring urine by today. Also scheduled pt for a virtual visit on Friday for follow up on UTI. Labs have been ordered.

## 2025-01-03 NOTE — Telephone Encounter (Signed)
 This RN made first attempt to triage patient. No answer, LVM. Routing for additional attempts.   Reason for Triage: pressure/burning and a slight reddish/pink tint. Would like to know if her husband to drop off a urine sample. [phone]

## 2025-01-03 NOTE — Telephone Encounter (Signed)
 See triage note.

## 2025-01-04 LAB — MICROSCOPIC EXAMINATION
Bacteria, UA: NONE SEEN
Casts: NONE SEEN /LPF
Epithelial Cells (non renal): NONE SEEN /HPF (ref 0–10)
RBC, Urine: NONE SEEN /HPF (ref 0–2)

## 2025-01-04 LAB — URINALYSIS, ROUTINE W REFLEX MICROSCOPIC
Bilirubin, UA: NEGATIVE
Glucose, UA: NEGATIVE
Ketones, UA: NEGATIVE
Nitrite, UA: NEGATIVE
Protein,UA: NEGATIVE
Specific Gravity, UA: 1.007 (ref 1.005–1.030)
Urobilinogen, Ur: 1 mg/dL (ref 0.2–1.0)
pH, UA: 7.5 (ref 5.0–7.5)

## 2025-01-05 ENCOUNTER — Ambulatory Visit: Payer: Self-pay | Admitting: Internal Medicine

## 2025-01-05 LAB — URINE CULTURE

## 2025-01-06 ENCOUNTER — Encounter: Payer: Self-pay | Admitting: Internal Medicine

## 2025-01-06 ENCOUNTER — Telehealth: Admitting: Internal Medicine

## 2025-01-06 DIAGNOSIS — R1024 Suprapubic pain: Secondary | ICD-10-CM | POA: Diagnosis not present

## 2025-01-06 DIAGNOSIS — M810 Age-related osteoporosis without current pathological fracture: Secondary | ICD-10-CM

## 2025-01-06 NOTE — Progress Notes (Unsigned)
 Virtual Visit via Caregility   Note   This format is felt to be most appropriate for this patient at this time.  All issues noted in this document were discussed and addressed.  No physical exam was performed (except for noted visual exam findings with Video Visits).   I connected with  Shannon Hickman  on 01/06/25 at  1:00 PM EST by a video enabled telemedicine application  and verified that I am speaking with the correct person using two identifiers. Location patient: home Location provider: work or home office Persons participating in the virtual visit: patient, provider  I discussed the limitations, risks, security and privacy concerns of performing an evaluation and management service by telephone and the availability of in person appointments. I also discussed with the patient that there may be a patient responsible charge related to this service. The patient expressed understanding and agreed to proceed. Reason for visit: follow up on UTI symptoms   HPI:  79 yr old female presents for follow up on  recent episode of suprapubic pressure occurring repeatedly with each void.  UA and culture negative for infection.  No abx were prescribed.  Symptoms have resolved. 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  She is no longer using anything but tylenol  for postoperative pain and is walking without a walker and getting PT from [facility]  Nausea:  recurrent , episodic.  Since surgery . Has been constipated; had no BM for 6 days.   ROS: See pertinent positives and negatives per HPI.  Past Medical History:  Diagnosis Date   Allergy    seasonal   Arthritis    Atypical mole 11/14/2024   L upper elbow, moderate atypia   Chicken pox    Diverticulitis    Heart murmur    High cholesterol    Hypertension    Inflammatory polyps of colon (HCC)    Migraine    Urinary tract infection     Past Surgical History:  Procedure Laterality Date   APPENDECTOMY  12/15/1996    BUNIONECTOMY Left 01/08/2017   Procedure: Arthrodesis Metatarsalphalangeal joint MTPJ Left;  Surgeon: Donnice Cory, DPM;  Location: [facility];  Service: Podiatry;  Laterality: Left;   BUNIONECTOMY Right 06/26/2021   Procedure: BUNIONECTOMY- LAPIDUS TYPE, PHALANX OSTEOTOMY- AKIN;  Surgeon: Ashley Soulier, DPM;  Location: [facility];  Service: Podiatry;  Laterality: Right;   CATARACT EXTRACTION W/PHACO Right 11/18/2023   Procedure: CATARACT EXTRACTION PHACO AND INTRAOCULAR LENS PLACEMENT (IOC) RIGHT 6.01 00:36.0;  Surgeon: Mittie Gaskin, MD;  Location: [facility];  Service: Ophthalmology;  Laterality: Right;   CATARACT EXTRACTION W/PHACO Left 12/02/2023   Procedure: CATARACT EXTRACTION PHACO AND INTRAOCULAR LENS PLACEMENT (IOC) LEFT 6.01 00:33.8;  Surgeon: Mittie Gaskin, MD;  Location: [facility];  Service: Ophthalmology;  Laterality: Left;   COLON SURGERY  1998   ruptured diverticulum   HISTOPLASMOSIS     KNEE ARTHROSCOPY Left 08/02/2015   Procedure: ARTHROSCOPY KNEE, PARTIAL MEDIAL MENISCECTOMY;  Surgeon: Ozell Flake, MD;  Location: [facility] OR[facility];  Service: Orthopedics;  Laterality: Left;   KNEE SURGERY     PARTIAL COLECTOMY     diverticular rupture, Unknown [facility]     Family History  Problem Relation Age of Onset   Heart disease Father 53   Diabetes Father 87   Stomach cancer Maternal Grandfather    Cancer Neg Hx     SOCIAL HX: ***  Current Medications[1]  EXAM:  VITALS per  patient if applicable:  GENERAL: alert, oriented, appears well and in no acute distress  HEENT: atraumatic, conjunttiva clear, no obvious abnormalities on inspection of external nose and ears  NECK: normal movements of the head and neck  LUNGS: on inspection no signs of respiratory distress, breathing rate appears normal, no obvious gross SOB, gasping or wheezing  CV: no obvious cyanosis  MS: moves all visible extremities without noticeable  abnormality  PSYCH/NEURO: pleasant and cooperative, no obvious depression or anxiety, speech and thought processing grossly intact  ASSESSMENT AND PLAN: There are no diagnoses linked to this encounter.    I discussed the assessment and treatment plan with the patient. The patient was provided an opportunity to ask questions and all were answered. The patient agreed with the plan and demonstrated an understanding of the instructions.   The patient was advised to call back or seek an in-person evaluation if the symptoms worsen or if the condition fails to improve as anticipated.   I spent 30 minutes dedicated to the care of this patient on the date of this encounter to include pre-visit review of patient's medical history,  including recent ER visit, imaging studies and labs, face-to-face time with the patient , and post visit ordering of testing and therapeutics.    Verneita LITTIE Kettering, MD     [1]  Current Outpatient Medications:    acetaminophen  (TYLENOL ) 325 MG tablet, Take 650 mg by mouth every 6 (six) hours as needed., Disp: , Rfl:    alendronate  (FOSAMAX ) 70 MG tablet, Take with a full glass of water on an empty stomach.TAKE 1 TABLET BY MOUTH EVERY 7 DAYS. TAKE WITH A FULL GLASS OF WATER ON AN EMPTY STOMACH, Disp: 12 tablet, Rfl: 1   amLODipine  (NORVASC ) 10 MG tablet, Take 1 tablet (10 mg total) by mouth daily., Disp: 90 tablet, Rfl: 1   atorvastatin  (LIPITOR) 40 MG tablet, Take 1 tablet (40 mg total) by mouth daily., Disp: 90 tablet, Rfl: 3   CALCIUM  PO, Take by mouth daily., Disp: , Rfl:    cetirizine (ZYRTEC) 10 MG tablet, Take 10 mg by mouth daily. , Disp: , Rfl:    Coenzyme Q10 (COQ10) 100 MG CAPS, Take 1 capsule by mouth daily., Disp: , Rfl:    hydrochlorothiazide  (HYDRODIURIL ) 25 MG tablet, Take 1 tablet (25 mg total) by mouth daily., Disp: 90 tablet, Rfl: 3   methocarbamol (ROBAXIN) 500 MG tablet, TAKE 1 TABLET BY MOUTH EVERY 6 HOURS AS NEEDED FOR MUSCLE SPASM, Disp: , Rfl:     metroNIDAZOLE  (METROGEL ) 0.75 % gel, Apply 1 Application topically at bedtime., Disp: , Rfl:    Misc Natural Products (OSTEO BI-FLEX ADV TRIPLE ST PO), , Disp: , Rfl:    Multiple Vitamins-Minerals (CENTRUM SILVER ULTRA WOMENS PO), Take 1 tablet by mouth daily., Disp: , Rfl:    ondansetron  (ZOFRAN ) 4 MG tablet, Take 4 mg by mouth every 6 (six) hours as needed. for nausea, Disp: , Rfl:    vitamin B-12 (CYANOCOBALAMIN ) 1000 MCG tablet, Take 1,000 mcg by mouth daily., Disp: , Rfl:

## 2025-01-08 MED ORDER — AMLODIPINE BESYLATE 10 MG PO TABS: 10.0000 mg | ORAL_TABLET | Freq: Every day | ORAL | 1 refills | Status: AC

## 2025-01-08 MED ORDER — ALENDRONATE SODIUM 70 MG PO TABS: ORAL_TABLET | ORAL | 1 refills | Status: AC

## 2025-01-12 DIAGNOSIS — R1024 Suprapubic pain: Secondary | ICD-10-CM | POA: Insufficient documentation

## 2025-01-12 NOTE — Assessment & Plan Note (Signed)
 With mild but resolved urinary retention secondary to postoperative opioid use .  UA/Culture negative for infection.  Constipation resolved.  No further workup needed

## 2025-01-12 NOTE — Assessment & Plan Note (Signed)
 S/p change in therapy from raloxifene  to alendronate   due to treatment failure and side effects .  Refilling today; will  Repeat DEXA  in 2 yrs post change in therapy

## 2025-01-18 ENCOUNTER — Encounter: Payer: Self-pay | Admitting: Internal Medicine

## 2025-03-31 ENCOUNTER — Ambulatory Visit: Admitting: Internal Medicine

## 2025-09-26 ENCOUNTER — Ambulatory Visit

## 2025-11-27 ENCOUNTER — Encounter: Admitting: Dermatology
